# Patient Record
Sex: Male | Born: 1965 | Race: Black or African American | Hispanic: No | Marital: Single | Smoking: Current every day smoker
Health system: Southern US, Community
[De-identification: ages and names within clinical notes are randomized; demographics above are authoritative.]

## PROBLEM LIST (undated history)

## (undated) DIAGNOSIS — T7840XA Allergy, unspecified, initial encounter: Secondary | ICD-10-CM

## (undated) DIAGNOSIS — F17200 Nicotine dependence, unspecified, uncomplicated: Secondary | ICD-10-CM

## (undated) DIAGNOSIS — N400 Enlarged prostate without lower urinary tract symptoms: Secondary | ICD-10-CM

## (undated) DIAGNOSIS — N503 Cyst of epididymis: Secondary | ICD-10-CM

## (undated) DIAGNOSIS — I10 Essential (primary) hypertension: Secondary | ICD-10-CM

## (undated) DIAGNOSIS — M1990 Unspecified osteoarthritis, unspecified site: Secondary | ICD-10-CM

## (undated) HISTORY — DX: Allergy, unspecified, initial encounter: T78.40XA

## (undated) HISTORY — PX: ARTERY REPAIR: SHX559

## (undated) HISTORY — DX: Nicotine dependence, unspecified, uncomplicated: F17.200

## (undated) HISTORY — DX: Unspecified osteoarthritis, unspecified site: M19.90

## (undated) HISTORY — PX: ARM WOUND REPAIR / CLOSURE: SUR1141

## (undated) HISTORY — DX: Cyst of epididymis: N50.3

## (undated) HISTORY — DX: Benign prostatic hyperplasia without lower urinary tract symptoms: N40.0

## (undated) HISTORY — PX: PROSTATE BIOPSY: SHX241

---

## 1998-05-19 ENCOUNTER — Emergency Department (HOSPITAL_COMMUNITY): Admission: EM | Admit: 1998-05-19 | Discharge: 1998-05-19 | Payer: Self-pay | Admitting: Emergency Medicine

## 1999-05-10 ENCOUNTER — Emergency Department (HOSPITAL_COMMUNITY): Admission: EM | Admit: 1999-05-10 | Discharge: 1999-05-10 | Payer: Self-pay | Admitting: Emergency Medicine

## 1999-09-21 ENCOUNTER — Encounter: Payer: Self-pay | Admitting: Emergency Medicine

## 1999-09-21 ENCOUNTER — Emergency Department (HOSPITAL_COMMUNITY): Admission: EM | Admit: 1999-09-21 | Discharge: 1999-09-21 | Payer: Self-pay | Admitting: Emergency Medicine

## 2001-04-27 ENCOUNTER — Emergency Department (HOSPITAL_COMMUNITY): Admission: EM | Admit: 2001-04-27 | Discharge: 2001-04-27 | Payer: Self-pay | Admitting: Emergency Medicine

## 2003-02-17 ENCOUNTER — Emergency Department (HOSPITAL_COMMUNITY): Admission: EM | Admit: 2003-02-17 | Discharge: 2003-02-17 | Payer: Self-pay | Admitting: Emergency Medicine

## 2003-02-17 ENCOUNTER — Encounter: Payer: Self-pay | Admitting: Emergency Medicine

## 2005-12-24 ENCOUNTER — Emergency Department (HOSPITAL_COMMUNITY): Admission: EM | Admit: 2005-12-24 | Discharge: 2005-12-24 | Payer: Self-pay | Admitting: Emergency Medicine
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
## 2007-11-19 ENCOUNTER — Emergency Department (HOSPITAL_COMMUNITY): Admission: EM | Admit: 2007-11-19 | Discharge: 2007-11-19 | Payer: Self-pay | Admitting: Emergency Medicine

## 2007-11-19 IMAGING — CR DG CERVICAL SPINE COMPLETE 4+V
6 series · 6 of 6 positions shown · non-contrast
Comparison: None

CLINICAL DATA: CHRONIC NECK PAIN.  LEFT ARM PAIN

CERVICAL SPINE - COMPLETE 4+ VIEW

[w c-spine lat]
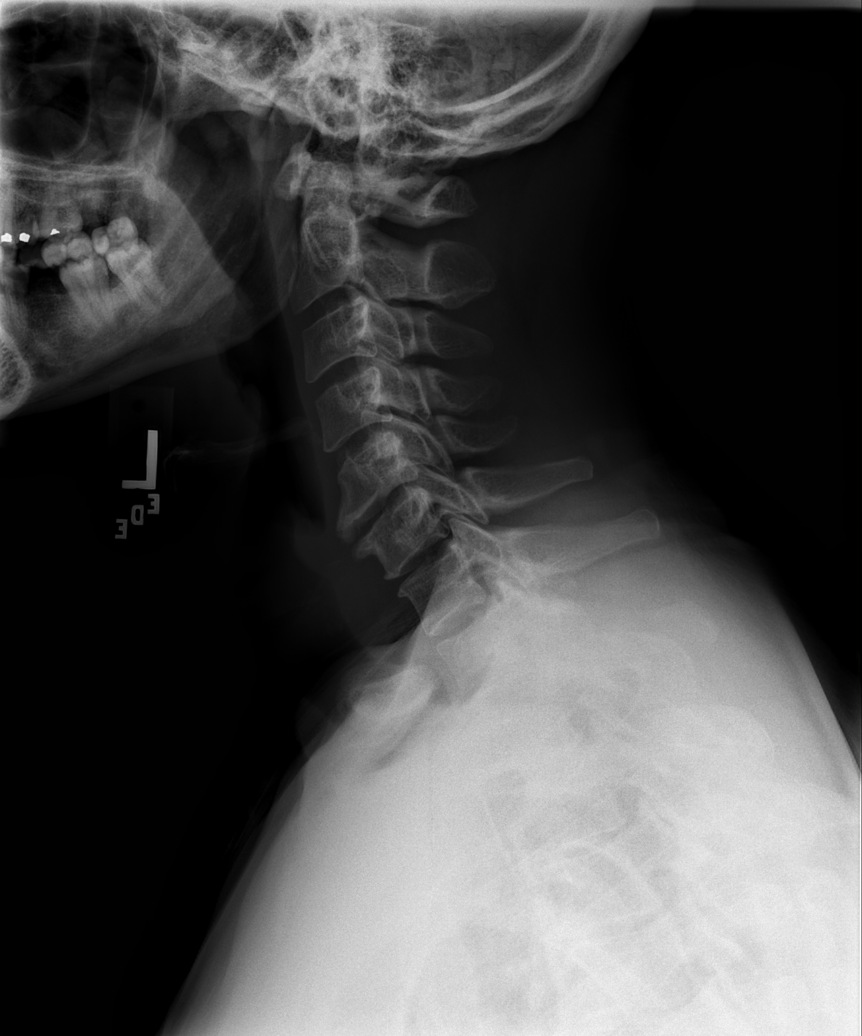

[w c-spine oblique (1 of 2)]
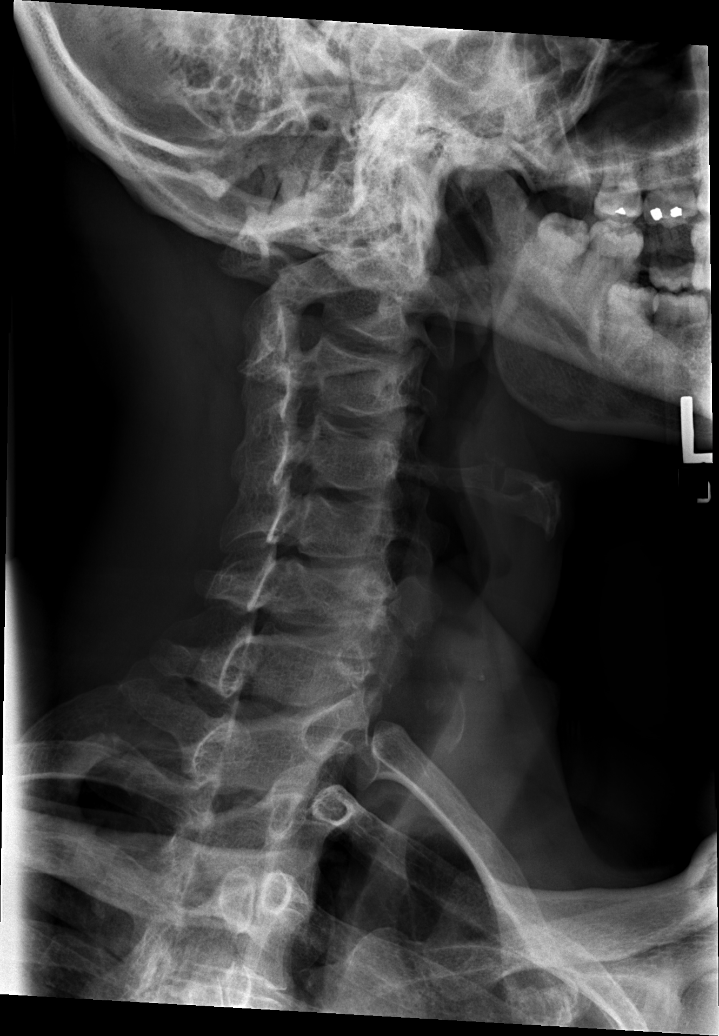

[w c-spine oblique (2 of 2)]
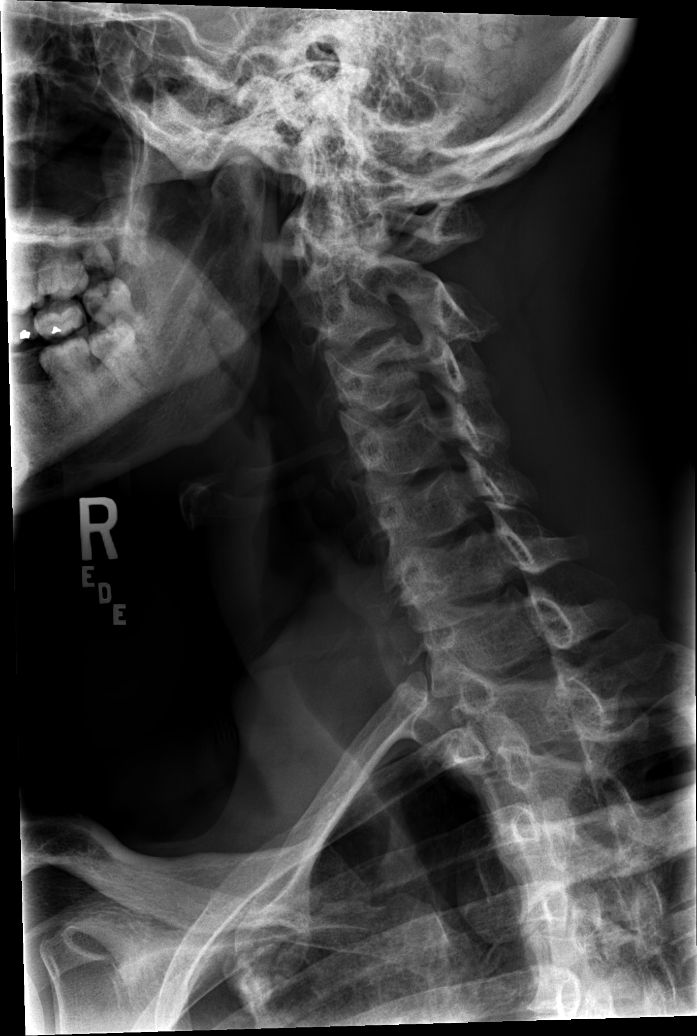

[w c-spine a.p.]
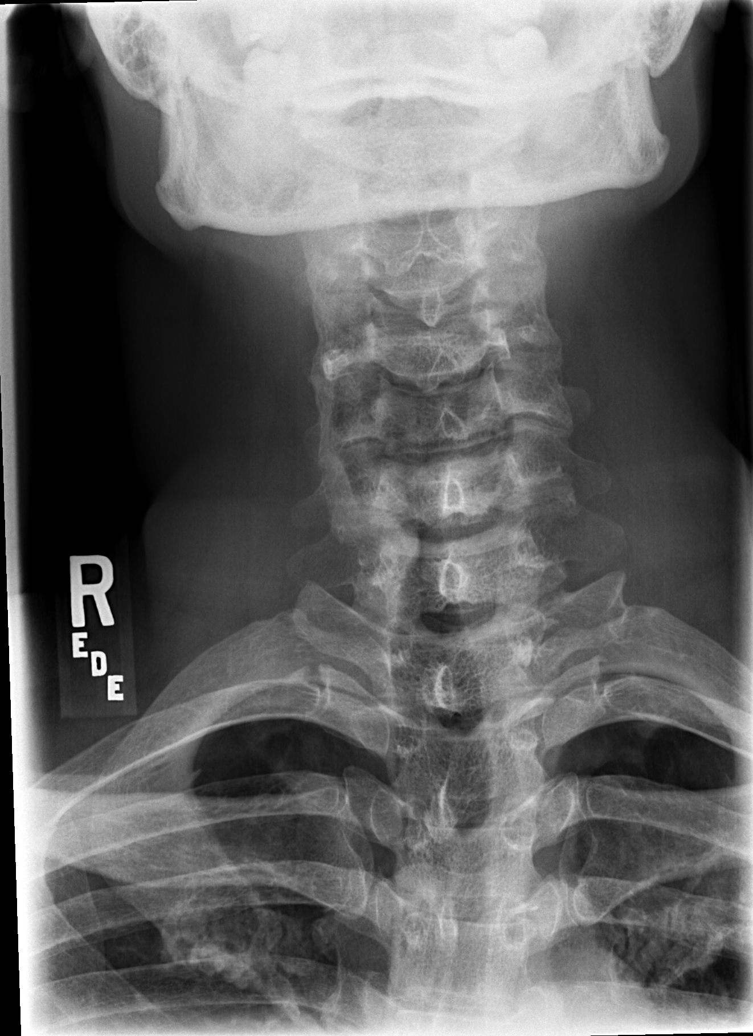

[w c-spine odontoid (1 of 2)]
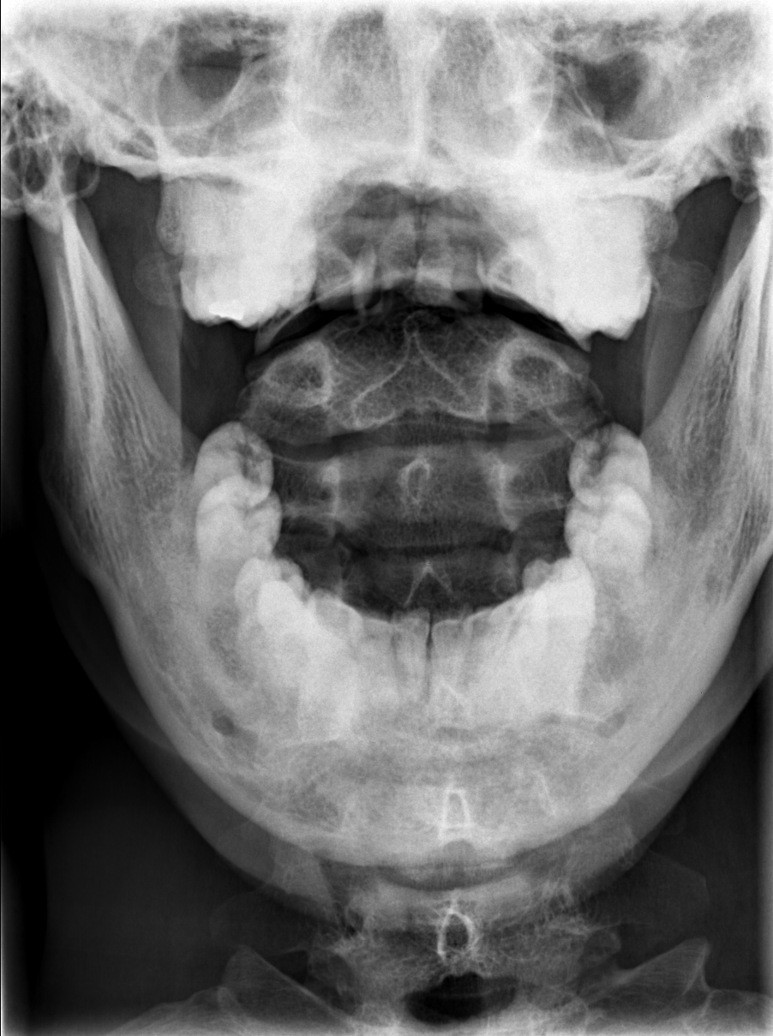

[w c-spine odontoid (2 of 2)]
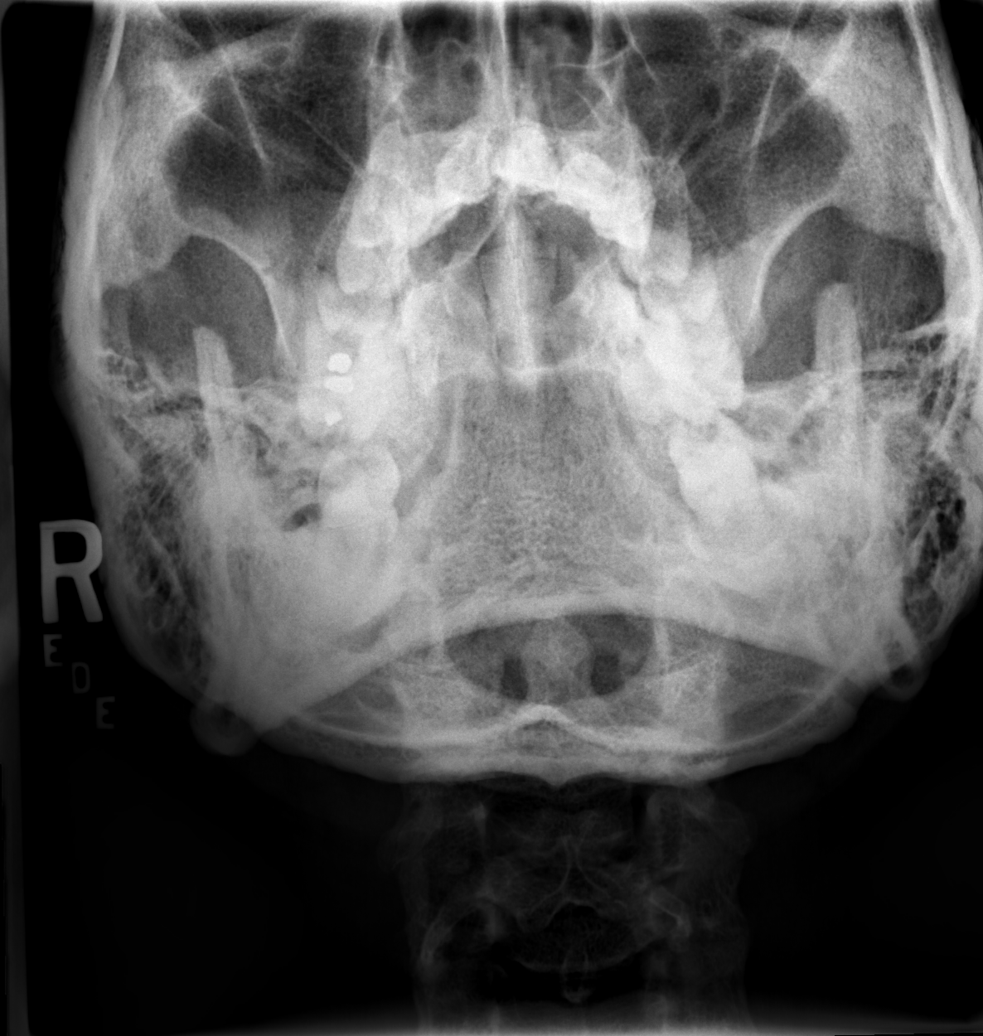

[6 of 6 positions shown; findings below may reference images not displayed]

FINDINGS: Prevertebral soft tissues appear normal.  There is
narrowing of the intervertebral disc spaces at C5-C6 and C6-C7
levels.  There is associated marginal osteophyte formation
representing degenerative spondylosis.  No fracture is evident.
Alignment is normal.  Foramina appear patent.
IMPRESSION: Evidence of degenerative disc disease and degenerative spondylosis.
No fracture or acute process seen.

## 2008-04-28 ENCOUNTER — Ambulatory Visit: Payer: Self-pay | Admitting: Nurse Practitioner

## 2008-04-28 DIAGNOSIS — F17200 Nicotine dependence, unspecified, uncomplicated: Secondary | ICD-10-CM

## 2008-04-28 DIAGNOSIS — K029 Dental caries, unspecified: Secondary | ICD-10-CM | POA: Insufficient documentation

## 2008-05-04 ENCOUNTER — Encounter (INDEPENDENT_AMBULATORY_CARE_PROVIDER_SITE_OTHER): Payer: Self-pay | Admitting: Nurse Practitioner

## 2008-06-23 ENCOUNTER — Ambulatory Visit: Payer: Self-pay | Admitting: Nurse Practitioner

## 2008-06-23 LAB — CONVERTED CEMR LAB
ALT: 11 units/L (ref 0–53)
AST: 18 units/L (ref 0–37)
Alkaline Phosphatase: 51 units/L (ref 39–117)
Basophils Absolute: 0 10*3/uL (ref 0.0–0.1)
Basophils Relative: 1 % (ref 0–1)
Bilirubin Urine: NEGATIVE
Cholesterol: 161 mg/dL (ref 0–200)
Creatinine, Ser: 1.08 mg/dL (ref 0.40–1.50)
Eosinophils Absolute: 0.2 10*3/uL (ref 0.0–0.7)
Eosinophils Relative: 4 % (ref 0–5)
Glucose, Urine, Semiquant: NEGATIVE
HCT: 41.7 % (ref 39.0–52.0)
Hemoglobin: 13 g/dL (ref 13.0–17.0)
LDL Cholesterol: 97 mg/dL (ref 0–99)
Lymphocytes Relative: 40 % (ref 12–46)
MCHC: 31.2 g/dL (ref 30.0–36.0)
Monocytes Absolute: 0.6 10*3/uL (ref 0.1–1.0)
PSA: 2.02 ng/mL (ref 0.10–4.00)
Platelets: 180 10*3/uL (ref 150–400)
RDW: 15.3 % (ref 11.5–15.5)
Sodium: 141 meq/L (ref 135–145)
Specific Gravity, Urine: >=1.03
TSH: 1.537 microintl units/mL (ref 0.350–4.50)
Total Bilirubin: 0.5 mg/dL (ref 0.3–1.2)
Total CHOL/HDL Ratio: 3.9
Total Protein: 7.2 g/dL (ref 6.0–8.3)
Urobilinogen, UA: 0.2
VLDL: 23 mg/dL (ref 0–40)

## 2008-06-27 ENCOUNTER — Encounter (INDEPENDENT_AMBULATORY_CARE_PROVIDER_SITE_OTHER): Payer: Self-pay | Admitting: Nurse Practitioner

## 2008-07-07 ENCOUNTER — Encounter (INDEPENDENT_AMBULATORY_CARE_PROVIDER_SITE_OTHER): Payer: Self-pay | Admitting: Nurse Practitioner

## 2008-07-07 ENCOUNTER — Ambulatory Visit (HOSPITAL_COMMUNITY): Admission: RE | Admit: 2008-07-07 | Discharge: 2008-07-07 | Payer: Self-pay | Admitting: Internal Medicine

## 2008-07-07 DIAGNOSIS — N503 Cyst of epididymis: Secondary | ICD-10-CM

## 2008-07-07 IMAGING — US US SCROTUM
1 series · 14 of 25 positions shown · non-contrast
Comparison: No priors

CLINICAL DATA: Bilateral scrotal masses

ULTRASOUND OF SCROTUM
TECHNIQUE: Complete ultrasound examination of the testicles,
epididymis, and other scrotal structures was performed.

[Series 1: unknown · 0.09mm/px · 14 of 54 slices shown]
[im 1/54]
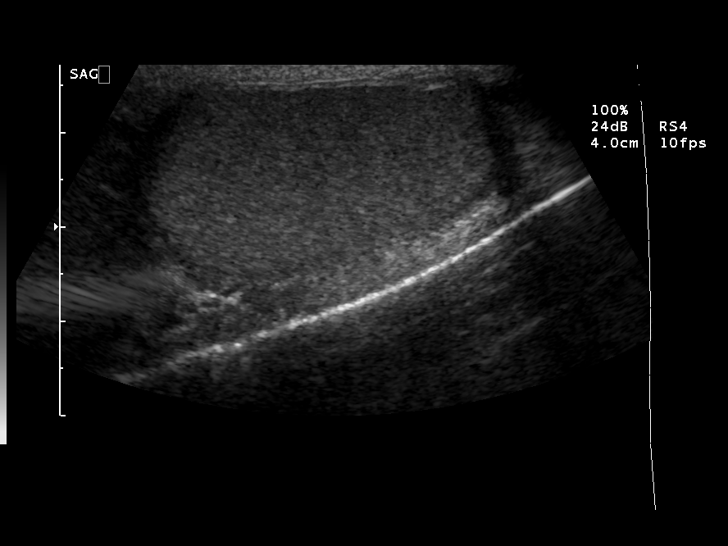
[im 5/54]
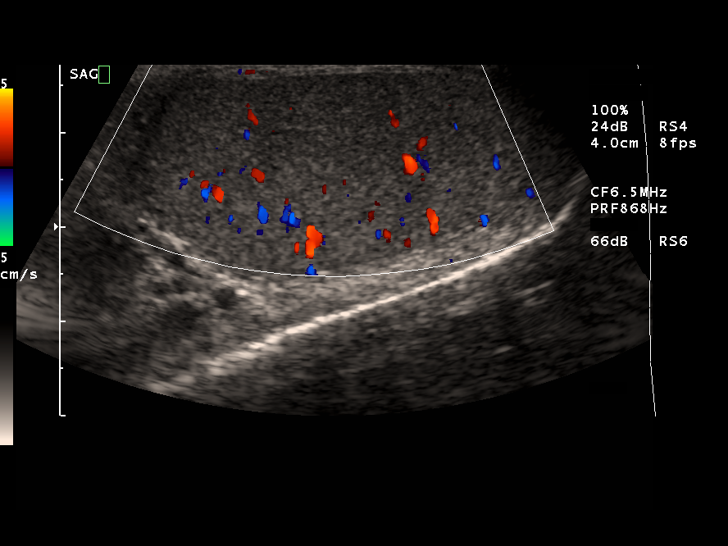
[im 9/54]
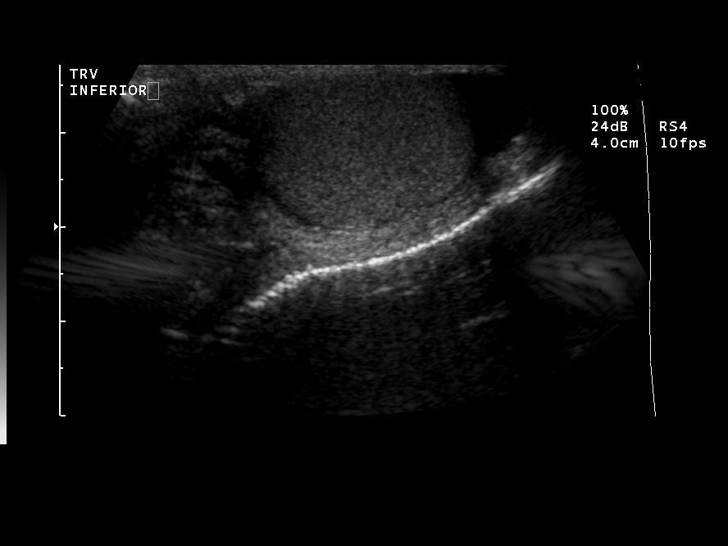
[im 14/54]
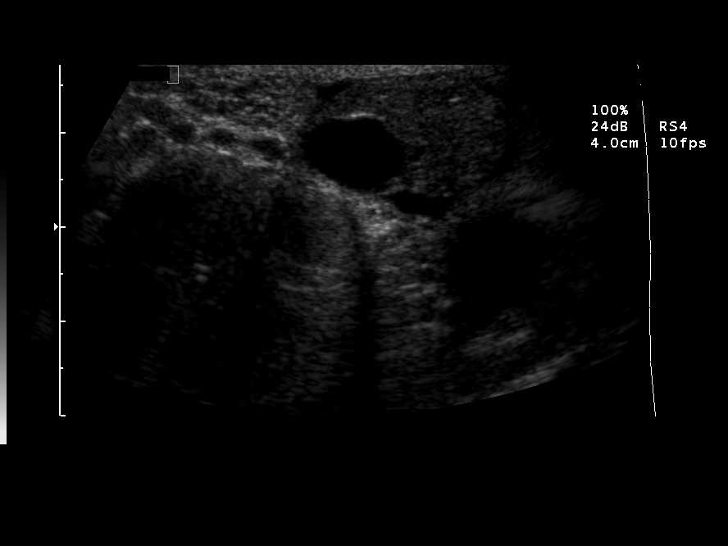
[im 18/54]
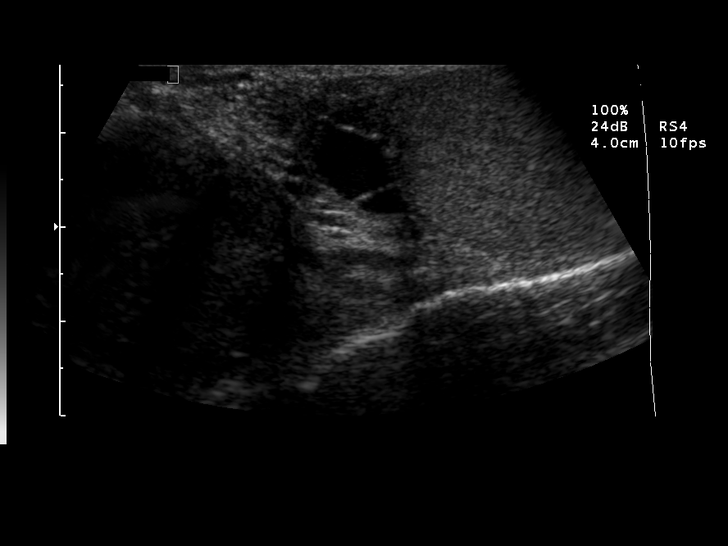
[im 20/54]
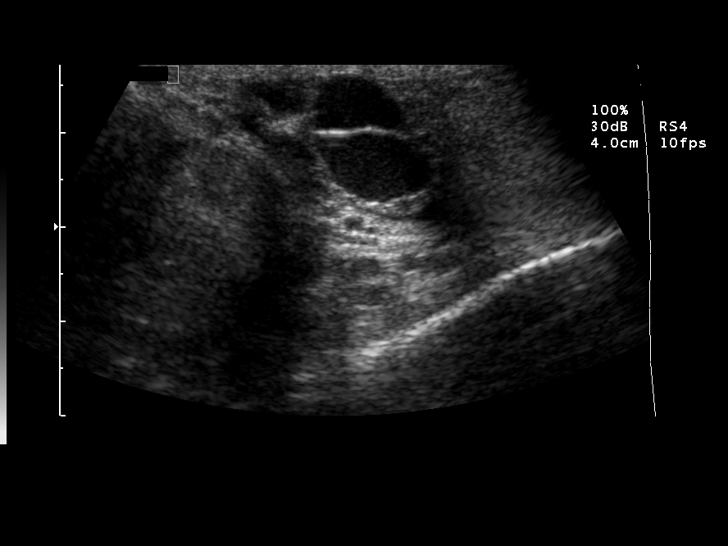
[im 25/54]
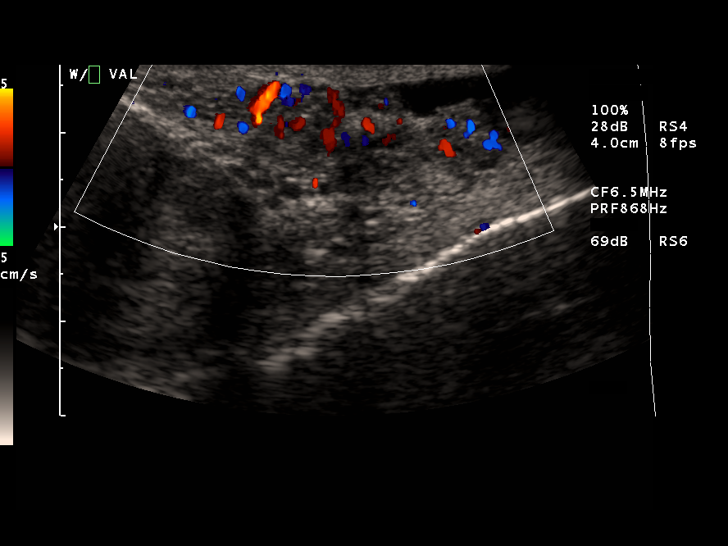
[im 29/54]
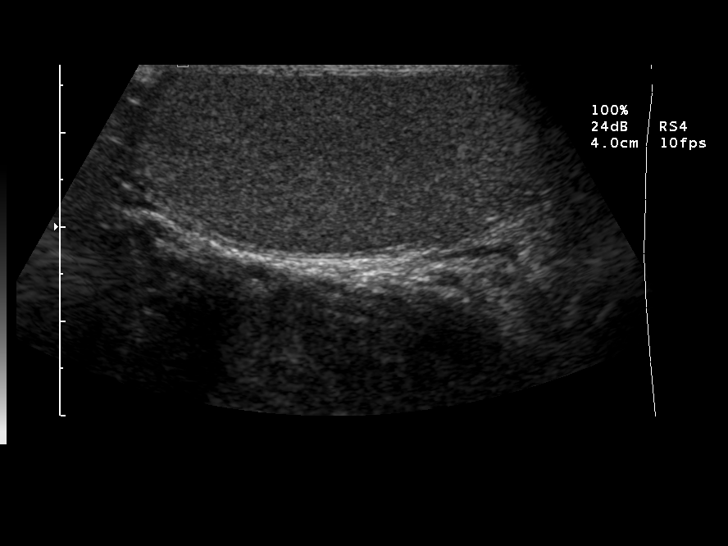
[im 34/54]
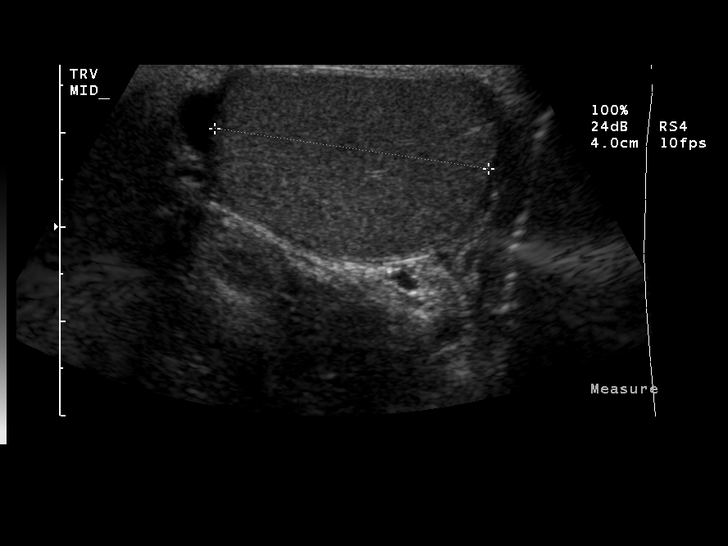
[im 36/54]
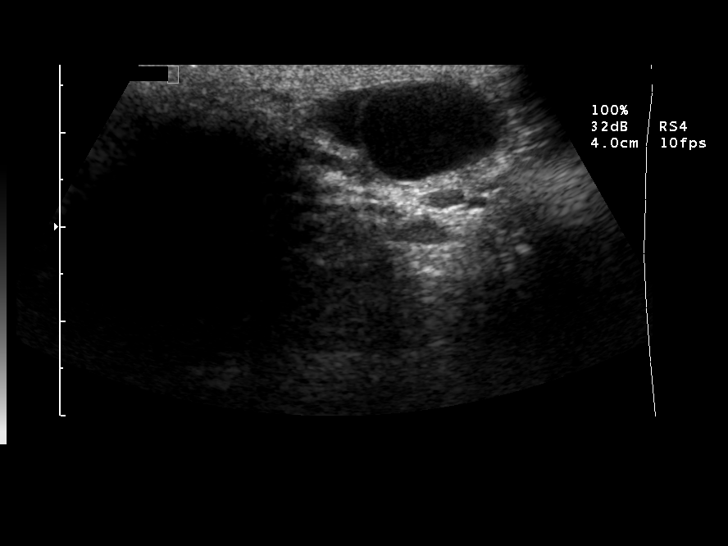
[im 40/54]
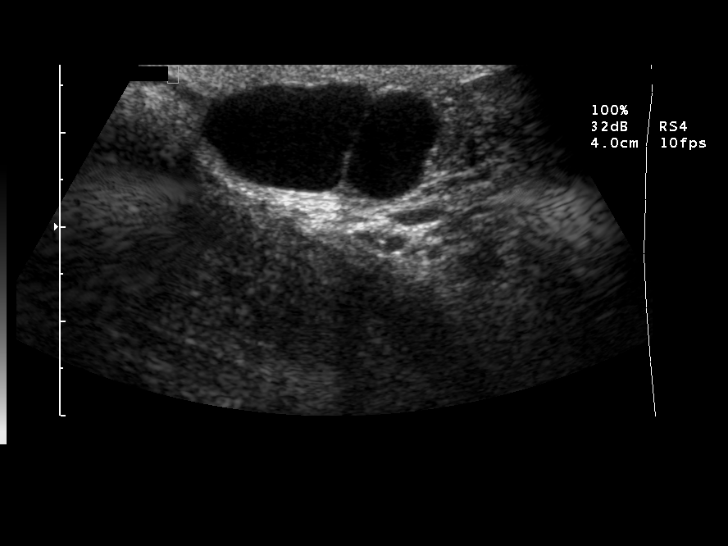
[im 45/54]
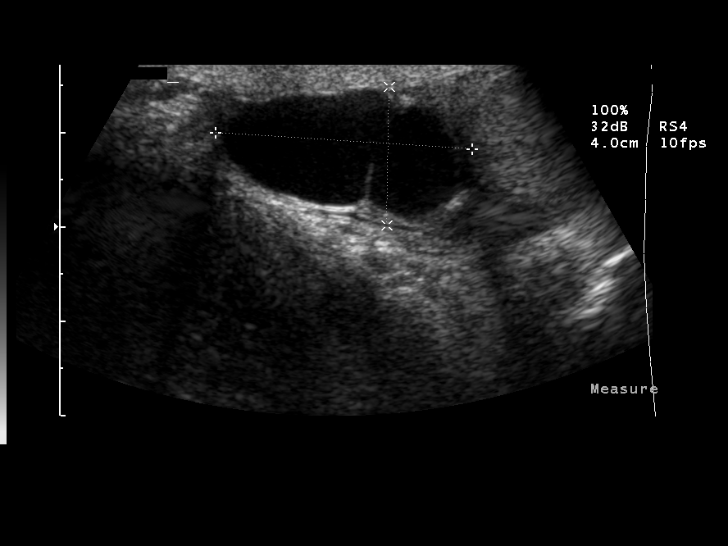
[im 49/54]
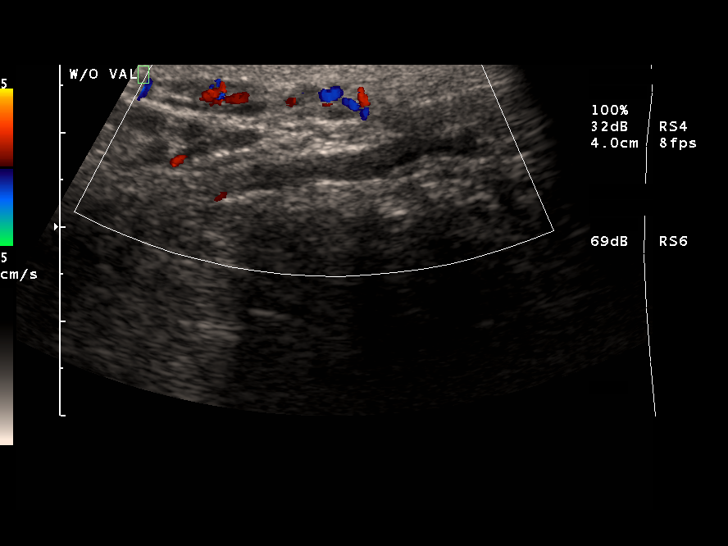
[im 54/54]
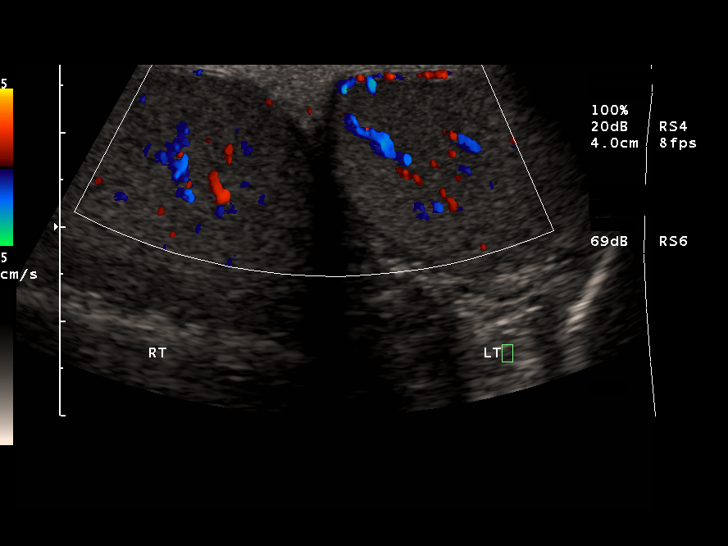

[14 of 25 positions shown; findings below may reference images not displayed]

FINDINGS: The testicles are normal bilaterally, including
symmetrical color flow Doppler.

There are bilateral, multiple, epididymal cysts/, as large as
cm in size.  The patient states that these are the abnormalities of
concern on physical exam.

No hydrocele or varicocele.
IMPRESSION: Large bilateral epididymal cysts.  See report

## 2012-10-21 ENCOUNTER — Ambulatory Visit: Payer: Self-pay | Admitting: Medical

## 2012-11-04 ENCOUNTER — Ambulatory Visit (INDEPENDENT_AMBULATORY_CARE_PROVIDER_SITE_OTHER): Payer: BC Managed Care – PPO | Admitting: Medical

## 2012-11-04 ENCOUNTER — Encounter: Payer: Self-pay | Admitting: Medical

## 2012-11-04 VITALS — BP 110/80 | HR 60 | Temp 97.7°F | Resp 16 | Ht 69.0 in | Wt 172.0 lb

## 2012-11-04 DIAGNOSIS — Z0000 Encounter for general adult medical examination without abnormal findings: Secondary | ICD-10-CM

## 2012-11-04 DIAGNOSIS — N508 Other specified disorders of male genital organs: Secondary | ICD-10-CM

## 2012-11-04 LAB — POCT URINALYSIS DIPSTICK
Bilirubin, UA: NEGATIVE
Ketones, UA: NEGATIVE
Leukocytes, UA: NEGATIVE
Spec Grav, UA: 1.01
pH, UA: 5

## 2012-11-04 LAB — COMPREHENSIVE METABOLIC PANEL
ALT: 14 U/L (ref 0–53)
CO2: 28 mEq/L (ref 19–32)
Calcium: 9.4 mg/dL (ref 8.4–10.5)
Chloride: 102 mEq/L (ref 96–112)
Creat: 1.05 mg/dL (ref 0.50–1.35)
Glucose, Bld: 88 mg/dL (ref 70–99)
Sodium: 138 mEq/L (ref 135–145)
Total Protein: 6.9 g/dL (ref 6.0–8.3)

## 2012-11-04 LAB — LIPID PANEL
Cholesterol: 151 mg/dL (ref 0–200)
HDL: 43 mg/dL (ref >39)
Triglycerides: 85 mg/dL (ref <150)

## 2012-11-04 LAB — CBC WITH DIFFERENTIAL/PLATELET
Basophils Absolute: 0 10*3/uL (ref 0.0–0.1)
Lymphocytes Relative: 41 % (ref 12–46)
Neutro Abs: 3.5 10*3/uL (ref 1.7–7.7)
Platelets: 193 10*3/uL (ref 150–400)
RBC: 4.93 MIL/uL (ref 4.22–5.81)
RDW: 14.1 % (ref 11.5–15.5)
WBC: 7.4 10*3/uL (ref 4.0–10.5)

## 2012-11-04 NOTE — Patient Instructions (Signed)
See eye doctor soon for screening.  See dentist soon for routine care.  We will refer you to Urology for enlarged prostate and multiple epididymal cysts.  Consider stopping tobacco.  We can recheck on the lump in your left buttock in a month or 2.   Preventative Care for Adults, Male       REGULAR HEALTH EXAMS:  A routine yearly physical is a good way to check in with your primary care provider about your health and preventive screening. It is also an opportunity to share updates about your health and any concerns you have, and receive a thorough all-over exam.   Most health insurance companies pay for at least some preventative services.  Check with your health plan for specific coverages.  WHAT PREVENTATIVE SERVICES DO MEN NEED?  Adult men should have their weight and blood pressure checked regularly.   Men age 29 and older should have their cholesterol levels checked regularly.  Beginning at age 25 and continuing to age 20, men should be screened for colorectal cancer.  Certain people should may need continued testing until age 19.  Other cancer screening may include exams for testicular and prostate cancer.  Updating vaccinations is part of preventative care.  Vaccinations help protect against diseases such as the flu.  Lab tests are generally done as part of preventative care to screen for anemia and blood disorders, to screen for problems with the kidneys and liver, to screen for bladder problems, to check blood sugar, and to check your cholesterol level.  Preventative services generally include counseling about diet, exercise, avoiding tobacco, drugs, excessive alcohol consumption, and sexually transmitted infections.    GENERAL RECOMMENDATIONS FOR GOOD HEALTH:  Healthy diet:  Eat a variety of foods, including fruit, vegetables, animal or vegetable protein, such as meat, fish, chicken, and eggs, or beans, lentils, tofu, and grains, such as rice.  Drink plenty of water  daily.  Decrease saturated fat in the diet, avoid lots of red meat, processed foods, sweets, fast foods, and fried foods.  Exercise:  Aerobic exercise helps maintain good heart health. At least 30-40 minutes of moderate-intensity exercise is recommended. For example, a brisk walk that increases your heart rate and breathing. This should be done on most days of the week.   Find a type of exercise or a variety of exercises that you enjoy so that it becomes a part of your daily life.  Examples are running, walking, swimming, water aerobics, and biking.  For motivation and support, explore group exercise such as aerobic class, spin class, Zumba, Yoga,or  martial arts, etc.    Set exercise goals for yourself, such as a certain weight goal, walk or run in a race such as a 5k walk/run.  Speak to your primary care provider about exercise goals.  Disease prevention:  If you smoke or chew tobacco, find out from your caregiver how to quit. It can literally save your life, no matter how long you have been a tobacco user. If you do not use tobacco, never begin.   Maintain a healthy diet and normal weight. Increased weight leads to problems with blood pressure and diabetes.   The Body Mass Index or BMI is a way of measuring how much of your body is fat. Having a BMI above 27 increases the risk of heart disease, diabetes, hypertension, stroke and other problems related to obesity. Your caregiver can help determine your BMI and based on it develop an exercise and dietary program to help  you achieve or maintain this important measurement at a healthful level.  High blood pressure causes heart and blood vessel problems.  Persistent high blood pressure should be treated with medicine if weight loss and exercise do not work.   Fat and cholesterol leaves deposits in your arteries that can block them. This causes heart disease and vessel disease elsewhere in your body.  If your cholesterol is found to be high, or if  you have heart disease or certain other medical conditions, then you may need to have your cholesterol monitored frequently and be treated with medication.   Ask if you should have a stress test if your history suggests this. A stress test is a test done on a treadmill that looks for heart disease. This test can find disease prior to there being a problem.  Avoid drinking alcohol in excess (more than two drinks per day).  Avoid use of street drugs. Do not share needles with anyone. Ask for professional help if you need assistance or instructions on stopping the use of alcohol, cigarettes, and/or drugs.  Brush your teeth twice a day with fluoride toothpaste, and floss once a day. Good oral hygiene prevents tooth decay and gum disease. The problems can be painful, unattractive, and can cause other health problems. Visit your dentist for a routine oral and dental check up and preventive care every 6-12 months.   Look at your skin regularly.  Use a mirror to look at your back. Notify your caregivers of changes in moles, especially if there are changes in shapes, colors, a size larger than a pencil eraser, an irregular border, or development of new moles.  Safety:  Use seatbelts 100% of the time, whether driving or as a passenger.  Use safety devices such as hearing protection if you work in environments with loud noise or significant background noise.  Use safety glasses when doing any work that could send debris in to the eyes.  Use a helmet if you ride a bike or motorcycle.  Use appropriate safety gear for contact sports.  Talk to your caregiver about gun safety.  Use sunscreen with a SPF (or skin protection factor) of 15 or greater.  Lighter skinned people are at a greater risk of skin cancer. Don't forget to also wear sunglasses in order to protect your eyes from too much damaging sunlight. Damaging sunlight can accelerate cataract formation.   Practice safe sex. Use condoms. Condoms are used for  birth control and to help reduce the spread of sexually transmitted infections (or STIs).  Some of the STIs are gonorrhea (the clap), chlamydia, syphilis, trichomonas, herpes, HPV (human papilloma virus) and HIV (human immunodeficiency virus) which causes AIDS. The herpes, HIV and HPV are viral illnesses that have no cure. These can result in disability, cancer and death.   Keep carbon monoxide and smoke detectors in your home functioning at all times. Change the batteries every 6 months or use a model that plugs into the wall.   Vaccinations:  Stay up to date with your tetanus shots and other required immunizations. You should have a booster for tetanus every 10 years. Be sure to get your flu shot every year, since 5%-20% of the [place] population comes down with the flu. The flu vaccine changes each year, so being vaccinated once is not enough. Get your shot in the fall, before the flu season peaks.   Other vaccines to consider:  Pneumococcal vaccine to protect against certain types of pneumonia.  This is  normally recommended for adults age 36 or older.  However, adults younger than 48 years old with certain underlying conditions such as diabetes, heart or lung disease should also receive the vaccine.  Shingles vaccine to protect against Varicella Zoster if you are older than age 34, or younger than 48 years old with certain underlying illness.  Hepatitis A vaccine to protect against a form of infection of the liver by a virus acquired from food.  Hepatitis B vaccine to protect against a form of infection of the liver by a virus acquired from blood or body fluids, particularly if you work in health care.  If you plan to travel internationally, check with your local health department for specific vaccination recommendations.  Cancer Screening:  Most routine colon cancer screening begins at the age of 87. On a yearly basis, doctors may provide special easy to use take-home tests to check for hidden  blood in the stool. Sigmoidoscopy or colonoscopy can detect the earliest forms of colon cancer and is life saving. These tests use a small camera at the end of a tube to directly examine the colon. Speak to your caregiver about this at age 78, when routine screening begins (and is repeated every 5 years unless early forms of pre-cancerous polyps or small growths are found).   At the age of 37 men usually start screening for prostate cancer every year. Screening may begin at a younger age for those with higher risk. Those at higher risk include African-Americans or having a family history of prostate cancer. There are two types of tests for prostate cancer:   Prostate-specific antigen (PSA) testing. Recent studies raise questions about prostate cancer using PSA and you should discuss this with your caregiver.   Digital rectal exam (in which your doctor's lubricated and gloved finger feels for enlargement of the prostate through the anus).   Screening for testicular cancer.  Do a monthly exam of your testicles. Gently roll each testicle between your thumb and fingers, feeling for any abnormal lumps. The best time to do this is after a hot shower or bath when the tissues are looser. Notify your caregivers of any lumps, tenderness or changes in size or shape immediately.
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
## 2012-11-10 ENCOUNTER — Telehealth: Payer: Self-pay | Admitting: Family Medicine

## 2012-11-10 NOTE — Telephone Encounter (Signed)
Patient is aware of his appointment at [facility] on 11/26/12 @ 100 am with Dr. Vernie Ammons. CLS  [phone]

## 2012-11-24 ENCOUNTER — Ambulatory Visit: Payer: Self-pay | Admitting: Family Medicine

## 2013-01-15 ENCOUNTER — Emergency Department (HOSPITAL_COMMUNITY)
Admission: EM | Admit: 2013-01-15 | Discharge: 2013-01-15 | Disposition: A | Discharge status: Home / Self Care | Payer: BC Managed Care – PPO | Attending: Emergency Medicine | Admitting: Emergency Medicine

## 2013-01-15 ENCOUNTER — Encounter (HOSPITAL_COMMUNITY): Payer: Self-pay | Admitting: *Deleted

## 2013-01-15 DIAGNOSIS — F17200 Nicotine dependence, unspecified, uncomplicated: Secondary | ICD-10-CM | POA: Insufficient documentation

## 2013-01-15 DIAGNOSIS — G5620 Lesion of ulnar nerve, unspecified upper limb: Secondary | ICD-10-CM | POA: Insufficient documentation

## 2013-01-15 DIAGNOSIS — Z8739 Personal history of other diseases of the musculoskeletal system and connective tissue: Secondary | ICD-10-CM | POA: Insufficient documentation

## 2013-01-15 DIAGNOSIS — G5621 Lesion of ulnar nerve, right upper limb: Secondary | ICD-10-CM

## 2013-01-15 MED ORDER — IBUPROFEN 800 MG PO TABS: 800.0000 mg | ORAL_TABLET | Freq: Three times a day (TID) | ORAL | Status: DC | PRN

## 2013-01-15 MED ORDER — IBUPROFEN 800 MG PO TABS
800.0000 mg | ORAL_TABLET | Freq: Once | ORAL | Status: AC
  Administered 2013-01-15: 800 mg via ORAL
  Filled 2013-01-15: qty 1

## 2013-01-15 NOTE — ED Notes (Signed)
Given patient Ice bag to put on right arm

## 2013-01-15 NOTE — ED Provider Notes (Signed)
Medical screening examination/treatment/procedure(s) were performed by non-physician practitioner and as supervising physician I was immediately available for consultation/collaboration.   Lyndsie Wallman, MD 01/15/13 0738 

## 2013-01-15 NOTE — ED Provider Notes (Signed)
History     CSN: [id number]  Arrival date & time 01/15/13  7829   First MD Initiated Contact with Patient 01/15/13 0255      Chief Complaint  Patient presents with  . Numbness   HPI  History provided by the patient. Patient is a 47 year old male with past history of right upper arm injury and surgical repair. Patient states as a teenager he came down on the pole while playing basketball causing a deep wound an injury to his right upper arm. He did have surgical repair but states following this he did not have any weakness or numbness in his upper extremity and has done well. Over the past one week or more he has been having episodes of numbness from his right pinkie in hand area sometimes traveling up his forearm and into his triceps area. 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 He denies any specific injury or trauma. Symptoms do occasionally seem worse at the end of work shift. He has not used any treatments for his symptoms. He denies any significant pains. He denies any neck pain or injury. No other aggravating or alleviating factors. No other associated symptoms.    Past Medical History  Diagnosis Date  . Tobacco use disorder     Past Surgical History  Procedure Laterality Date  . Artery repair      right upper arm - teenager, s/p trauma to right upper arm  . Arm wound repair / closure      right upper posterior arm - teenager, s/p trauma    Family History  Problem Relation Age of Onset  . Hypertension Mother   . Pneumonia Father     died of pneumonia  . Cancer Neg Hx   . Heart disease Neg Hx   . Stroke Neg Hx   . Diabetes Neg Hx     History  Substance Use Topics  . Smoking status: Current Every Day Smoker -- 0.75 packs/day for 15 years  . Smokeless tobacco: Not on file  . Alcohol Use: 3.0 oz/week    5 Shots of liquor per week      Review of Systems  HENT: Negative for neck pain.    Neurological: Positive for numbness. Negative for weakness.  All other systems reviewed and are negative.    Allergies  Review of patient's allergies indicates no known allergies.  Home Medications  No current outpatient prescriptions on file.  BP 135/118  Temp(Src) 98 F (36.7 C) (Oral)  Resp 18  SpO2 99%  Physical Exam  Nursing note and vitals reviewed. Constitutional: He is oriented to person, place, and time. He appears well-developed and well-nourished.  HENT:  Head: Normocephalic.  Neck: Normal range of motion. Neck supple.  No cervical midline tenderness. No soft tissue tenderness of the neck. No swelling or deformities.  Cardiovascular: Normal rate and regular rhythm.   Pulmonary/Chest: Effort normal and breath sounds normal.  Musculoskeletal: Normal range of motion. He exhibits no edema and no tenderness.  Old appearing scar to the posterior right upper arm.No deformity of right upper extremity. There is no pain or tenderness. He has normal range of motion of shoulder, elbow, wrist and finger joints.  normal 2 point discrimination of all digits. Normal sharp and dull sensation. Normal cap refill less than 2 seconds.  Neurological: He is alert and oriented to person, place, and time.  Skin: Skin is warm.  Psychiatric: He has  a normal mood and affect. His behavior is normal.    ED Course  Procedures       1. Ulnar nerve entrapment syndrome, right       MDM  3:20 AM patient seen and evaluated. Patient resting comfortably appears well in no acute distress. Denies any recent injury or trauma. Normal strength. Neurovascularly intact. Symptoms may be consistent with ulnar nerve entrapment (cubital tunnel syndrome),however patient may require additional workup. At this time we'll recommend conservative treatment with rest, ice, compression and elevation. Patient agrees with this plan. He will be given orthopedic hand referral.        Angus Seller,  PA-C 01/15/13 0340

## 2013-01-15 NOTE — ED Notes (Signed)
Numbness radiating down rt. Backside of arm to rt. Pinky, rt. Ring finger. Previous surgery to rt. Arm when young.

## 2014-05-28 ENCOUNTER — Emergency Department (HOSPITAL_COMMUNITY)
Admission: EM | Admit: 2014-05-28 | Discharge: 2014-05-28 | Disposition: A | Discharge status: Home / Self Care | Payer: BC Managed Care – PPO | Attending: Emergency Medicine | Admitting: Emergency Medicine

## 2014-05-28 ENCOUNTER — Encounter (HOSPITAL_COMMUNITY): Payer: Self-pay | Admitting: Emergency Medicine

## 2014-05-28 DIAGNOSIS — R21 Rash and other nonspecific skin eruption: Secondary | ICD-10-CM | POA: Diagnosis present

## 2014-05-28 DIAGNOSIS — Z720 Tobacco use: Secondary | ICD-10-CM | POA: Diagnosis not present

## 2014-05-28 DIAGNOSIS — L509 Urticaria, unspecified: Secondary | ICD-10-CM | POA: Insufficient documentation

## 2014-05-28 MED ORDER — HYDROXYZINE HCL 25 MG PO TABS: 25.0000 mg | ORAL_TABLET | Freq: Four times a day (QID) | ORAL | Status: DC | PRN

## 2014-05-28 MED ORDER — TRIAMCINOLONE ACETONIDE 0.1 % EX CREA: 1.0000 "application " | TOPICAL_CREAM | Freq: Three times a day (TID) | CUTANEOUS | Status: DC | PRN

## 2014-05-28 NOTE — ED Provider Notes (Signed)
CSN: [id number]     Arrival date & time 05/28/14  7829 History  This chart was scribed for [employer], working with Mariea Clonts, MD found by Starleen Arms, ED Scribe. This patient was seen in room TR07C/TR07C and the patient's care was started at 9:20 AM.   Chief Complaint  Patient presents with  . Rash   Patient is a 48 y.o. male presenting with rash. The history is provided by the patient. No language interpreter was used.  Rash Location:  Torso Torso rash location:  R flank Quality: dryness and itchiness   Quality: not blistering, not burning, not draining, not painful, not peeling, not red, not scaling, not swelling and not weeping   Severity:  Mild Onset quality:  Gradual Duration:  4 days Timing:  Constant Progression:  Partially resolved Chronicity:  New Context: new detergent/soap   Relieved by:  Anti-itch cream Worsened by:  Nothing tried Ineffective treatments:  None tried Associated symptoms: no abdominal pain, no fatigue, no fever, no headaches, no induration, no myalgias, no nausea, no shortness of breath, no sore throat, no throat swelling, no tongue swelling, no URI, not vomiting and not wheezing     HPI Comments: Jorge Walker is a 48 y.o. male with no PMHx who presents to the Emergency Department complaining of a constant itching rash to R flank onset 3-4 days ago.  The patient reports that rash first appeared on his right lower back and has since spread to his right abdomen as he itched the area.   Patient reports the rash becomes red after scratching it but denies redness at other times.  Patient reports he has used topical anti-itch cream with some relief.  Patient denies recent travel or sleeping in other beds aside from his own.  Patient denies changing body wash, changing detergent but does state he uses several different soaps.   Patient denies weeping/drainage, fever, sore throat, eye drainage, rhinorrhea, lip/tongue swelling, abdominal pain,  nausea, vomiting, eye watering, or other complaints.  Patient is UTD on vaccinations.  Patient denies receiving a singles shot.  NKDA.    PCP: none current.  Former: Dr. Alyson Ingles and Dr. Glade Lloyd Past Medical History  Diagnosis Date  . Tobacco use disorder    Past Surgical History  Procedure Laterality Date  . Artery repair      right upper arm - teenager, s/p trauma to right upper arm  . Arm wound repair / closure      right upper posterior arm - teenager, s/p trauma   Family History  Problem Relation Age of Onset  . Hypertension Mother   . Pneumonia Father     died of pneumonia  . Cancer Neg Hx   . Heart disease Neg Hx   . Stroke Neg Hx   . Diabetes Neg Hx    History  Substance Use Topics  . Smoking status: Current Every Day Smoker -- 0.75 packs/day for 15 years  . Smokeless tobacco: Not on file  . Alcohol Use: 3.0 oz/week    5 Shots of liquor per week    Review of Systems  Constitutional: Negative for fever and fatigue.  HENT: Negative for congestion, facial swelling, rhinorrhea, sneezing and sore throat.   Eyes: Negative for discharge.  Respiratory: Negative for shortness of breath and wheezing.   Gastrointestinal: Negative for nausea, vomiting and abdominal pain.  Musculoskeletal: Negative for myalgias.  Skin: Positive for color change and rash.  Allergic/Immunologic: Negative for environmental allergies, food allergies and  immunocompromised state.  Neurological: Negative for numbness and headaches.   10 Systems reviewed and all are negative for acute change except as noted in the HPI.    Allergies  Review of patient's allergies indicates no known allergies.  Home Medications   Prior to Admission medications   Medication Sig Start Date End Date Taking? Authorizing Provider  ibuprofen (ADVIL,MOTRIN) 800 MG tablet Take 1 tablet (800 mg total) by mouth every 8 (eight) hours as needed for pain. 01/15/13   Ruthell Rummage Dammen, PA-C   BP 121/84  Pulse 67  Temp(Src)  98.6 F (37 C) (Oral)  Resp 20  Ht 5' 8.5" (1.74 m)  Wt 173 lb (78.472 kg)  BMI 25.92 kg/m2  SpO2 99% Physical Exam  Nursing note and vitals reviewed. Constitutional: He is oriented to person, place, and time. Vital signs are normal. He appears well-developed and well-nourished.  Non-toxic appearance. No distress.  Afebrile, nontoxic  HENT:  Head: Normocephalic and atraumatic.  Mouth/Throat: Oropharynx is clear and moist and mucous membranes are normal.  Eyes: Conjunctivae and EOM are normal. Right eye exhibits no discharge. Left eye exhibits no discharge.  Neck: Normal range of motion. Neck supple.  Cardiovascular: Normal rate.   Pulmonary/Chest: Effort normal. No respiratory distress.  Abdominal: Normal appearance. He exhibits no distension.  Musculoskeletal: Normal range of motion.  Neurological: He is alert and oriented to person, place, and time.  Skin: Skin is warm, dry and intact. Rash noted. Rash is urticarial. No erythema.  Resolving urticarial rash over R flank and R lateral abdomen, area darkened without excoriations or vesicles, no erythema or warmth, no areas of induration or fluctuance  Psychiatric: He has a normal mood and affect. His behavior is normal.    ED Course  Procedures (including critical care time)  DIAGNOSTIC STUDIES: Oxygen Saturation is 99% on RA, normal by my interpretation.    COORDINATION OF CARE:  9:27 AM Will order anti-itch cream.  Will order follow-up with PCP or dermatologist.  Advised patient that he can rotate through his body soaps to try to isolate the cause of the rash.  Advised patient of return precautions.    Labs Review Labs Reviewed - No data to display  Imaging Review No results found.   EKG Interpretation None      MDM   Final diagnoses:  Rash of back    47y/o male with rash, nonspecific appearance, could be allergic component although no specific source in hx aside from maybe multiple detergents. Discussed that it  could be eczema, and discussed decreasing soap varieties, and taking cool showers and using aveeno after. Discussed that it could be contact dermatitis, and will rx triamcinolone and vistaril. Discussed it does not appear to be shingles or scabies, but return precautions for these given. Will have pt f/up with PCP and dermatology as needed for ongoing symptoms. I explained the diagnosis and have given explicit precautions to return to the ER including for any other new or worsening symptoms. The patient understands and accepts the medical plan as it's been dictated and I have answered their questions. Discharge instructions concerning home care and prescriptions have been given. The patient is STABLE and is discharged to home in good condition.  BP 121/84  Pulse 67  Temp(Src) 98.6 F (37 C) (Oral)  Resp 20  Ht 5' 8.5" (1.74 m)  Wt 173 lb (78.472 kg)  BMI 25.92 kg/m2  SpO2 99%  Meds ordered this encounter  Medications  . hydrOXYzine (ATARAX/VISTARIL) 25  MG tablet    Sig: Take 1 tablet (25 mg total) by mouth every 6 (six) hours as needed for itching.    Dispense:  28 tablet    Refill:  0    Order Specific Question:  Supervising Provider    Answer:  Noemi Chapel D [[id number]]  . triamcinolone cream (KENALOG) 0.1 %    Sig: Apply 1 application topically 3 (three) times daily as needed. For itching    Dispense:  30 g    Refill:  0    Order Specific Question:  Supervising Provider    Answer:  Noemi Chapel D [[id number]]    I personally performed the services described in this documentation, which was scribed in my presence. The recorded information has been reviewed and is accurate.   [place], [place] 05/28/14 [phone]

## 2014-05-28 NOTE — ED Provider Notes (Signed)
Medical screening examination/treatment/procedure(s) were performed by non-physician practitioner and as supervising physician I was immediately available for consultation/collaboration.   EKG Interpretation None        Mariea Clonts, MD 05/28/14 1651

## 2014-05-28 NOTE — ED Notes (Signed)
Pt c/o rash to right side/flank area x 1 week. Pt reports rash is itching. Pt has dark area noted to right flank area.

## 2014-05-28 NOTE — Discharge Instructions (Signed)
Use triamcinolone cream for itching or rash, vistaril for itching or allergy type symptoms, and continue your usual home medications. Get plenty of rest, drink plenty of fluids, shower with cool water (no hot steamy showers); decrease the number of different soaps you use, and try to see if one specific one causes it; use cetaphil cleanser or dove soap when using soap; use eucerin or aveeno lotion after every bath/shower. Please followup with your primary doctor for discussion of your diagnoses and further evaluation after today's visit; if you do not have a primary care doctor use the resource guide provided to find one; Also follow-up as needed with the dermatology office as listed in your discharge instructions. Return to the ER for any changes or worsening symptoms   Rash A rash is a change in the color or texture of your skin. There are many different types of rashes. You may have other problems that accompany your rash. CAUSES   Infections.  Allergic reactions. This can include allergies to pets or foods.  Certain medicines.  Exposure to certain chemicals, soaps, or cosmetics.  Heat.  Exposure to poisonous plants.  Tumors, both cancerous and noncancerous. SYMPTOMS   Redness.  Scaly skin.  Itchy skin.  Dry or cracked skin.  Bumps.  Blisters.  Pain. DIAGNOSIS  Your caregiver may do a physical exam to determine what type of rash you have. A skin sample (biopsy) may be taken and examined under a microscope. TREATMENT  Treatment depends on the type of rash you have. Your caregiver may prescribe certain medicines. For serious conditions, you may need to see a skin doctor (dermatologist). HOME CARE INSTRUCTIONS   Avoid the substance that caused your rash.  Do not scratch your rash. This can cause infection.  You may take cool baths to help stop itching.  Only take over-the-counter or prescription medicines as directed by your caregiver.  Keep all follow-up appointments  as directed by your caregiver. SEEK IMMEDIATE MEDICAL CARE IF:  You have increasing pain, swelling, or redness.  You have a fever.  You have new or severe symptoms.  You have body aches, diarrhea, or vomiting.  Your rash is not better after 3 days. MAKE SURE YOU:  Understand these instructions.  Will watch your condition.  Will get help right away if you are not doing well or get worse. Document Released: 08/02/2002 Document Revised: 11/04/2011 Document Reviewed: 05/27/2011 [facility] Patient Information 2015 [place], [place]. This information is not intended to replace advice given to you by your health care provider. Make sure you discuss any questions you have with your health care provider. Emergency Department Resource Guide 1) Find a Doctor and Pay Out of Pocket Although you won't have to find out who is covered by your insurance plan, it is a good idea to ask around and get recommendations. You will then need to call the office and see if the doctor you have chosen will accept you as a new patient and what types of options they offer for patients who are self-pay. Some doctors offer discounts or will set up payment plans for their patients who do not have insurance, but you will need to ask so you aren't surprised when you get to your appointment.  2) Contact Your Local Health Department Not all health departments have doctors that can see patients for sick visits, but many do, so it is worth a call to see if yours does. If you don't know where your local health department is, you can  check in your phone book. The CDC also has a tool to help you locate your state's health department, and many state websites also have listings of all of their local health departments.  3) Find a [facility] If your illness is not likely to be very severe or complicated, you may want to try a walk in clinic. These are popping up all over the country in pharmacies, drugstores, and shopping centers.  They're usually staffed by nurse practitioners or physician assistants that have been trained to treat common illnesses and complaints. They're usually fairly quick and inexpensive. However, if you have serious medical issues or chronic medical problems, these are probably not your best option.  No Primary Care Doctor: - Call Health Connect at  [phone] - they can help you locate a primary care doctor that  accepts your insurance, provides certain services, etc. - Physician Referral Service- [phone]  Chronic Pain Problems: Organization         Address  Phone   Notes  [facility]  [phone] Patients need to be referred by their primary care doctor.   Medication Assistance: Organization         Address  Phone   Notes  [facility] Medication [facility] [address] [place], [place] [address] [phone] --Must be a resident of [facility] -- Must have NO insurance coverage whatsoever (no Medicaid/ Medicare, etc.) -- The pt. MUST have a primary care doctor that directs their care regularly and follows them in the community   [facility]  [phone]   [employer]  [phone]    Agencies that provide inexpensive medical care: Organization         Address  Phone   Notes  [place]  [phone]   [facility]    [phone]   [facility] [place], [place] [address] [phone]   [place] [address] [place] [phone]   [facility]    [phone]   [facility]    [phone]   [place] [address], [place] Phone:  [phone], Fax:  [phone] Hours of Operation:  9 am - 6 pm, M-F.  Also accepts Medicaid/Medicare and self-pay.  [facility] for [address], [address], [place] Phone: [phone], Fax: [phone]. Hours of Operation:  8:30 am - 5:30 pm, M-F.  Also accepts Medicaid and self-pay.  [facility] [address], [place] Phone: [phone]   [place], [place], [place] [phone], Ext. [phone] Mondays & Thursdays: 7-9 AM.  First 15 patients are seen on a first come, first serve basis.    [place] Providers:  Organization         Address  Phone   Notes  [facility] [address], [place] [phone] Also accepts self-pay patients.  [facility] [address], [place]  [phone]   [place], [address], [place] [phone]   [facility] [address] [place] [phone]   Lucianne Lei [address], [place]   [phone] Only accepts [place] patients after they have their name applied to  their card.   Self-Pay (no insurance) in [facility]:  Organization         Address  Phone   Notes  Sickle Cell Patients, [facility] [place] [phone]   [facility] [place] [phone]   [facility] [place]  [place], [place], [place] [phone]   [facility]/Dr. Osei-Bonsu  [address], [place] or [address], [place] [phone] Phone number for both [place] and [place] locations is the same.  [facility]gent [facility] and [facility] [address] [place] [phone]   [facility] [address], [place] or [address] [phone]   [facility] [address] [place] [phone], phone; [phone], fax Sees patients 1st and 3rd Saturday of every month.  Must not qualify for public or private insurance (i.e.  Medicaid, Medicare, [place] Health Choice, Veterans' Benefits)  Household income should be no more than 200% of the poverty level The clinic cannot treat you if you are pregnant or think you are pregnant  Sexually transmitted diseases are not treated at the clinic.

## 2014-08-17 ENCOUNTER — Telehealth: Payer: Self-pay | Admitting: Medical

## 2014-08-17 ENCOUNTER — Encounter: Payer: Self-pay | Admitting: Medical

## 2014-08-17 ENCOUNTER — Ambulatory Visit (INDEPENDENT_AMBULATORY_CARE_PROVIDER_SITE_OTHER): Payer: BC Managed Care – PPO | Admitting: Medical

## 2014-08-17 VITALS — BP 110/80 | HR 68 | Temp 98.1°F | Resp 16 | Ht 68.5 in | Wt 130.0 lb

## 2014-08-17 DIAGNOSIS — Z23 Encounter for immunization: Secondary | ICD-10-CM

## 2014-08-17 DIAGNOSIS — F17200 Nicotine dependence, unspecified, uncomplicated: Secondary | ICD-10-CM

## 2014-08-17 DIAGNOSIS — N4340 Spermatocele of epididymis, unspecified: Secondary | ICD-10-CM

## 2014-08-17 DIAGNOSIS — Z720 Tobacco use: Secondary | ICD-10-CM

## 2014-08-17 DIAGNOSIS — Z125 Encounter for screening for malignant neoplasm of prostate: Secondary | ICD-10-CM

## 2014-08-17 DIAGNOSIS — N400 Enlarged prostate without lower urinary tract symptoms: Secondary | ICD-10-CM

## 2014-08-17 DIAGNOSIS — D179 Benign lipomatous neoplasm, unspecified: Secondary | ICD-10-CM

## 2014-08-17 DIAGNOSIS — Z0000 Encounter for general adult medical examination without abnormal findings: Secondary | ICD-10-CM

## 2014-08-17 LAB — LIPID PANEL
Cholesterol: 153 mg/dL (ref 0–200)
HDL: 39 mg/dL — AB (ref >39)
LDL CALC: 92 mg/dL (ref 0–99)
TRIGLYCERIDES: 110 mg/dL (ref <150)
Total CHOL/HDL Ratio: 3.9 Ratio
VLDL: 22 mg/dL (ref 0–40)

## 2014-08-17 LAB — POCT URINALYSIS DIPSTICK
Bilirubin, UA: NEGATIVE
Blood, UA: NEGATIVE
GLUCOSE UA: NEGATIVE
Ketones, UA: NEGATIVE
Leukocytes, UA: NEGATIVE
NITRITE UA: NEGATIVE
PROTEIN UA: NEGATIVE
Spec Grav, UA: 1.02
UROBILINOGEN UA: NEGATIVE
pH, UA: 6

## 2014-08-17 LAB — COMPREHENSIVE METABOLIC PANEL
ALBUMIN: 3.5 g/dL (ref 3.5–5.2)
ALK PHOS: 38 U/L — AB (ref 39–117)
ALT: 21 U/L (ref 0–53)
AST: 28 U/L (ref 0–37)
BILIRUBIN TOTAL: 0.3 mg/dL (ref 0.2–1.2)
BUN: 12 mg/dL (ref 6–23)
CO2: 28 mEq/L (ref 19–32)
Calcium: 9.1 mg/dL (ref 8.4–10.5)
Chloride: 104 mEq/L (ref 96–112)
Creat: 1.04 mg/dL (ref 0.50–1.35)
Glucose, Bld: 82 mg/dL (ref 70–99)
POTASSIUM: 4.1 meq/L (ref 3.5–5.3)
SODIUM: 139 meq/L (ref 135–145)
TOTAL PROTEIN: 5.3 g/dL — AB (ref 6.0–8.3)

## 2014-08-17 MED ORDER — VARENICLINE TARTRATE 0.5 MG X 11 & 1 MG X 42 PO MISC: ORAL | Status: DC

## 2014-08-17 MED ORDER — TAMSULOSIN HCL 0.4 MG PO CAPS: 0.4000 mg | ORAL_CAPSULE | Freq: Every day | ORAL | Status: DC

## 2014-08-17 NOTE — Progress Notes (Signed)
Subjective:   HPI  Jorge Walker is a 48 y.o. male who presents for a complete physical.   Preventative care: Last ophthalmology visit: n/a Last dental visit:yes Last colonoscopy:n/a Last prostate exam: ? Last EKG:3/ 2014 Last labs:2014  Prior vaccinations: TD or Tdap:? Influenza:not sure about this Pneumococcal:n/a Shingles/Zostavax:n/a  Concerns: Wants me to check lumps on his body from last year's physical including left upper thigh lump and buttock lumps bilat cheeks.  Wants to recheck the lumps in the scrotum, thinks the lumps may be about the same as last year.    Occasionally when using bathroom, has some changes in stream, some hesitancy.  Weaker stream than in the past.   Some dribbling at the end of the stream.   Some nocturia, not daily, sometimes more than once.  Doesn't drink much water.  Drinks a lot of soda.   Reviewed their medical, surgical, family, social, medication, and allergy history and updated chart as appropriate.  Past Medical History  Diagnosis Date  . Tobacco use disorder   . BPH (benign prostatic hypertrophy)   . Epididymal cyst     Past Surgical History  Procedure Laterality Date  . Artery repair      right upper arm - teenager, s/p trauma to right upper arm  . Arm wound repair / closure      right upper posterior arm - teenager, s/p trauma    History   Social History  . Marital Status: Single    Spouse Name: N/A    Number of Children: N/A  . Years of Education: N/A   Occupational History  . Not on file.   Social History Main Topics  . Smoking status: Current Every Day Smoker -- 0.75 packs/day for 16 years  . Smokeless tobacco: Not on file  . Alcohol Use: 3.0 oz/week    5 Shots of liquor per week  . Drug Use: No  . Sexual Activity: Not on file   Other Topics Concern  . Not on file   Social History Narrative   Single, girlfriend, divorced, has 1 child, 81yo daughter wants to go to college, she lives with mother,  exercise with walking at work, works in Scientist, research (life sciences) at [employer] History  Problem Relation Age of Onset  . Hypertension Mother   . Pneumonia Father     died of pneumonia  . Cancer Neg Hx   . Heart disease Neg Hx   . Stroke Neg Hx   . Diabetes Neg Hx     Current outpatient prescriptions: tamsulosin (FLOMAX) 0.4 MG CAPS capsule, Take 1 capsule (0.4 mg total) by mouth daily., Disp: 30 capsule, Rfl: 3;  varenicline (CHANTIX STARTING MONTH PAK) 0.5 MG X 11 & 1 MG X 42 tablet, Take one 0.5 mg tablet by mouth once daily for 3 days, then increase to one 0.5 mg tablet twice daily for 4 days, then increase to one 1 mg tablet twice daily., Disp: 53 tablet, Rfl: 0  No Known Allergies   Review of Systems Constitutional: -fever, -chills, -sweats, -unexpected weight change, -decreased appetite, -fatigue Allergy: -sneezing, -itching, -congestion Dermatology: -changing moles, --rash, +lumps ENT: -runny nose, -ear pain, -sore throat, -hoarseness, -sinus pain, -teeth pain, - ringing in ears, -hearing loss, -nosebleeds Cardiology: -chest pain, -palpitations, -swelling, -difficulty breathing when lying flat, -waking up short of breath Respiratory: -cough, -shortness of breath, -difficulty breathing with exercise or exertion, -wheezing, -coughing up blood Gastroenterology: -abdominal pain, -nausea, -vomiting, -diarrhea, -constipation, -blood in  stool, -changes in bowel movement, -difficulty swallowing or eating Hematology: -bleeding, -bruising  Musculoskeletal: +joint aches, -muscle aches, -joint swelling, -back pain, -neck pain, -cramping, -changes in gait Ophthalmology: denies vision changes, eye redness, itching, discharge Urology: -burning with urination, -difficulty urinating, -blood in urine, +urinary frequency, -urgency, -incontinence Neurology: -headache, -weakness, -tingling, -numbness, -memory loss, -falls, -dizziness Psychology: -depressed mood, -agitation, -sleep problems      Objective:   Physical Exam  BP 110/80 mmHg  Pulse 68  Temp(Src) 98.1 F (36.7 C) (Oral)  Resp 16  Ht 5' 8.5" (1.74 m)  Wt 130 lb (58.968 kg)  BMI 19.48 kg/m2   General appearance: alert, no distress, WD/WN, lean AA male Skin:right tricep region with prior trauma scar, no other worrisome lesions HEENT: normocephalic, conjunctiva/corneas normal, sclerae anicteric, PERRLA, EOMi, nares patent, no discharge or erythema, pharynx normal Oral cavity: MMM, tongue normal, teeth with upper dentures, othwerise in good repair Neck: supple, no lymphadenopathy, no thyromegaly, no masses, normal ROM, no bruits Chest: non tender, normal shape and expansion Heart: RRR, normal S1, S2, no murmurs Lungs: CTA bilaterally, no wheezes, rhonchi, or rales Abdomen: +bs, soft, non tender, non distended, no masses, no hepatomegaly, no splenomegaly, no bruits Back: non tender, normal ROM, no scoliosis Musculoskeletal: left buttock midline with 3-4cm deep round palpable mass, possible cyst vs lipoma, nontender, left upper anterior thigh with 4cm not well defined but fatty tissue lump most likely lipoma, otherwise upper extremities non tender, no obvious deformity, normal ROM throughout, lower extremities non tender, no obvious deformity, normal ROM throughout Extremities: no edema, no cyanosis, no clubbing Pulses: 2+ symmetric, upper and lower extremities, normal cap refill Neurological: alert, oriented x 3, CN2-12 intact, strength normal upper extremities and lower extremities, sensation normal throughout, DTRs 2+ throughout, no cerebellar signs, gait normal Psychiatric: normal affect, behavior normal, pleasant  GU: normal male external genitalia, large multiple nodules round and hard bilat superior to testes suggestive of epididymal cysts as noted on prior 2009 ultrasound, but seem larger, somewhat tender, left largest is >1.3 cm diameter, no hernia, no lymphadenopathy Rectal: small external hemorrhoids, prostate  moderately enlarged, no palpable nodules, occult negative stool   Assessment and Plan :   Encounter Diagnoses  Name Primary?  . Encounter for health maintenance examination in adult Yes  . Tobacco use disorder   . Spermatocele   . BPH (benign prostatic hypertrophy)   . Screening for prostate cancer   . Need for Tdap vaccination   . Need for prophylactic vaccination against Streptococcus pneumoniae (pneumococcus)   . Need for prophylactic vaccination and inoculation against influenza   . Lipoma    We discussed his concerns.   He is not having problems with the spermatoceles or lumps/lipomas, thus, no additional treatment at this time.  Reassured they are benign findings Begin Flomax for BPH symptoms Labs today Counseled on the Tdap (tetanus, diptheria, and acellular pertussis) vaccine.  Vaccine information sheet given. Tdap vaccine given after consent obtained. Counseled on the influenza virus vaccine.  Vaccine information sheet given.  Influenza vaccine given after consent obtained. Counseled on the pneumococcal vaccine.  Vaccine information sheet given.  Pneumococcal/PPSV23 vaccine given after consent obtained. He agrees to treatment for tobacco cessation.   Begin counseling, Chantix, efforts to stop. discussed other medication options, discussed risks/benefits of chantix.  F/u pending labs

## 2014-08-17 NOTE — Patient Instructions (Signed)
Encounter Diagnoses  Name Primary?  . Encounter for health maintenance examination in adult Yes  . Tobacco use disorder   . Spermatocele   . BPH (benign prostatic hypertrophy)   . Screening for prostate cancer   . Need for Tdap vaccination   . Need for prophylactic vaccination against Streptococcus pneumoniae (pneumococcus)   . Need for prophylactic vaccination and inoculation against influenza   . Lipoma    Recommendations:  Begin Chantix starter pack to help stop tobacco  Call 1 800 quit now hotline for counseling on helping quit tobacco  Plan to recheck in 1-2 months on tobacco cessation  Wait at least 1-2 weeks before starting Flomax  Begin Flomax once daily to help with urinary symptoms  We will call with lab results  We updated your tetanus diphtheria pertussis vaccine today  We updated your pneumococcal vaccine today  We did your yearly flu vaccine today  You have spermatoceles of both scrotal areas that are benign  You have a lipoma and benign findings of the left upper thigh and left buttock See your eye doctor yearly for routine vision care. See your dentist yearly for routine dental care including hygiene visits twice yearly.

## 2014-08-18 LAB — RPR

## 2014-08-18 LAB — CBC
HEMATOCRIT: 44.6 % (ref 39.0–52.0)
Hemoglobin: 14.3 g/dL (ref 13.0–17.0)
MCH: 27.6 pg (ref 26.0–34.0)
MCHC: 32.1 g/dL (ref 30.0–36.0)
MCV: 85.9 fL (ref 78.0–100.0)
MPV: 10.3 fL (ref 9.4–12.4)
Platelets: 188 10*3/uL (ref 150–400)
RBC: 5.19 MIL/uL (ref 4.22–5.81)
RDW: 14 % (ref 11.5–15.5)
WBC: 7 10*3/uL (ref 4.0–10.5)

## 2014-08-18 LAB — HIV ANTIBODY (ROUTINE TESTING W REFLEX): HIV 1&2 Ab, 4th Generation: NONREACTIVE

## 2014-08-18 LAB — PSA: PSA: 3.25 ng/mL (ref <=4.00)

## 2014-08-18 LAB — GC/CHLAMYDIA PROBE AMP
CT PROBE, AMP APTIMA: NEGATIVE
GC Probe RNA: NEGATIVE

## 2014-08-23 NOTE — Telephone Encounter (Signed)
error 

## 2016-01-02 ENCOUNTER — Encounter: Payer: Self-pay | Admitting: Medical

## 2016-01-02 ENCOUNTER — Ambulatory Visit (INDEPENDENT_AMBULATORY_CARE_PROVIDER_SITE_OTHER): Payer: Self-pay | Admitting: Medical

## 2016-01-02 VITALS — BP 128/82 | HR 59 | Ht 68.0 in | Wt 172.0 lb

## 2016-01-02 DIAGNOSIS — Z125 Encounter for screening for malignant neoplasm of prostate: Secondary | ICD-10-CM

## 2016-01-02 DIAGNOSIS — N503 Cyst of epididymis: Secondary | ICD-10-CM

## 2016-01-02 DIAGNOSIS — K759 Inflammatory liver disease, unspecified: Secondary | ICD-10-CM

## 2016-01-02 DIAGNOSIS — M79644 Pain in right finger(s): Secondary | ICD-10-CM

## 2016-01-02 DIAGNOSIS — D179 Benign lipomatous neoplasm, unspecified: Secondary | ICD-10-CM

## 2016-01-02 DIAGNOSIS — Z0000 Encounter for general adult medical examination without abnormal findings: Secondary | ICD-10-CM

## 2016-01-02 DIAGNOSIS — D1720 Benign lipomatous neoplasm of skin and subcutaneous tissue of unspecified limb: Secondary | ICD-10-CM | POA: Insufficient documentation

## 2016-01-02 DIAGNOSIS — D1779 Benign lipomatous neoplasm of other sites: Secondary | ICD-10-CM | POA: Insufficient documentation

## 2016-01-02 DIAGNOSIS — N401 Enlarged prostate with lower urinary tract symptoms: Secondary | ICD-10-CM | POA: Insufficient documentation

## 2016-01-02 DIAGNOSIS — R351 Nocturia: Secondary | ICD-10-CM

## 2016-01-02 DIAGNOSIS — Z1211 Encounter for screening for malignant neoplasm of colon: Secondary | ICD-10-CM

## 2016-01-02 DIAGNOSIS — F17200 Nicotine dependence, unspecified, uncomplicated: Secondary | ICD-10-CM

## 2016-01-02 DIAGNOSIS — Z113 Encounter for screening for infections with a predominantly sexual mode of transmission: Secondary | ICD-10-CM

## 2016-01-02 DIAGNOSIS — N400 Enlarged prostate without lower urinary tract symptoms: Secondary | ICD-10-CM

## 2016-01-02 LAB — CBC WITH DIFFERENTIAL/PLATELET
BASOS PCT: 0 %
Basophils Absolute: 0 cells/uL (ref 0–200)
EOS PCT: 4 %
Eosinophils Absolute: 268 cells/uL (ref 15–500)
HCT: 40.6 % (ref 38.5–50.0)
Hemoglobin: 13.3 g/dL (ref 13.2–17.1)
LYMPHS PCT: 42 %
Lymphs Abs: 2814 cells/uL (ref 850–3900)
MCH: 27.8 pg (ref 27.0–33.0)
MCHC: 32.8 g/dL (ref 32.0–36.0)
MCV: 84.8 fL (ref 80.0–100.0)
MONOS PCT: 8 %
MPV: 10.6 fL (ref 7.5–12.5)
Monocytes Absolute: 536 cells/uL (ref 200–950)
NEUTROS ABS: 3082 {cells}/uL (ref 1500–7800)
Neutrophils Relative %: 46 %
PLATELETS: 210 10*3/uL (ref 140–400)
RBC: 4.79 MIL/uL (ref 4.20–5.80)
RDW: 13.8 % (ref 11.0–15.0)
WBC: 6.7 10*3/uL (ref 4.0–10.5)

## 2016-01-02 LAB — HEPATIC FUNCTION PANEL
ALBUMIN: 4.1 g/dL (ref 3.6–5.1)
ALK PHOS: 47 U/L (ref 40–115)
ALT: 14 U/L (ref 9–46)
AST: 24 U/L (ref 10–35)
BILIRUBIN INDIRECT: 0.4 mg/dL (ref 0.2–1.2)
Bilirubin, Direct: 0.1 mg/dL (ref <=0.2)
TOTAL PROTEIN: 6.4 g/dL (ref 6.1–8.1)
Total Bilirubin: 0.5 mg/dL (ref 0.2–1.2)

## 2016-01-02 LAB — HEMOGLOBIN A1C
Hgb A1c MFr Bld: 5.6 % (ref <5.7)
Mean Plasma Glucose: 114 mg/dL

## 2016-01-02 LAB — BASIC METABOLIC PANEL
BUN: 16 mg/dL (ref 7–25)
CO2: 26 mmol/L (ref 20–31)
CREATININE: 1.03 mg/dL (ref 0.70–1.33)
Calcium: 9.3 mg/dL (ref 8.6–10.3)
Chloride: 103 mmol/L (ref 98–110)
Glucose, Bld: 76 mg/dL (ref 65–99)
Potassium: 4.3 mmol/L (ref 3.5–5.3)
Sodium: 138 mmol/L (ref 135–146)

## 2016-01-02 LAB — LIPID PANEL
CHOLESTEROL: 185 mg/dL (ref 125–200)
HDL: 57 mg/dL (ref >=40)
LDL CALC: 108 mg/dL (ref <130)
TRIGLYCERIDES: 98 mg/dL (ref <150)
Total CHOL/HDL Ratio: 3.2 Ratio (ref <=5.0)
VLDL: 20 mg/dL (ref <30)

## 2016-01-02 NOTE — Progress Notes (Signed)
Subjective:   HPI  Jorge Walker is a 50 y.o. male who presents for a complete physical.   Here for physical and concern for Hep C.  He donates plasma, and recently he failed the blood screen and there was concern for Hep C.  He denies prior hx/o IV drug use, not sure if he has ever had blood transfusion, denies other suspected exposures. No prior hx/o  Still has some nocturia, hesitant urine stream at times, hx/o BPH, but never took medication from last visit.  He has ongoing problem with right middle finger pain and can't straighten it fully.  Reviewed their medical, surgical, family, social, medication, and allergy history and updated chart as appropriate.  Past Medical History  Diagnosis Date  . Tobacco use disorder   . BPH (benign prostatic hypertrophy)   . Epididymal cyst     Past Surgical History  Procedure Laterality Date  . Artery repair      right upper arm - teenager, s/p trauma to right upper arm  . Arm wound repair / closure      right upper posterior arm - teenager, s/p trauma    Social History   Social History  . Marital Status: Single    Spouse Name: N/A  . Number of Children: N/A  . Years of Education: N/A   Occupational History  . Not on file.   Social History Main Topics  . Smoking status: Current Every Day Smoker -- 0.75 packs/day for 16 years  . Smokeless tobacco: Not on file  . Alcohol Use: 3.0 oz/week    5 Shots of liquor per week  . Drug Use: No  . Sexual Activity: Not on file   Other Topics Concern  . 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 Hypertension Mother   . Pneumonia Father     died of pneumonia  . Cancer Neg Hx   . Heart disease Neg Hx   . Stroke Neg Hx   .  Diabetes Neg Hx     No current outpatient prescriptions on file.  No Known Allergies   Review of Systems Constitutional: -fever, -chills, -sweats, -unexpected weight change, -decreased appetite, -fatigue Allergy: -sneezing, -itching, -congestion Dermatology: -changing moles, --rash, +lumps ENT: -runny nose, -ear pain, -sore throat, -hoarseness, -sinus pain, -teeth pain, - ringing in ears, -hearing loss, -nosebleeds Cardiology: -chest pain, -palpitations, -swelling, -difficulty breathing when lying flat, -waking up short of breath Respiratory: -cough, -shortness of breath, -difficulty breathing with exercise or exertion, -wheezing, -coughing up blood Gastroenterology: -abdominal pain, -nausea, -vomiting, -diarrhea, -constipation, -blood in stool, -changes in bowel movement, -difficulty swallowing or eating Hematology: -bleeding, -bruising  Musculoskeletal: +joint aches, -muscle aches, -joint swelling, -back pain, -neck pain, -cramping, -changes in gait Ophthalmology: denies vision changes, eye redness, itching, discharge Urology: -burning with urination, -difficulty urinating, -blood in urine, +urinary frequency, -urgency, -incontinence Neurology: -headache, -weakness, -tingling, -numbness, -memory loss, -falls, -dizziness Psychology: -depressed mood, -agitation, -sleep problems     Objective:   Physical Exam  BP 128/82 mmHg  Pulse 59  Ht 5\' 8"  (1.727 m)  Wt 172 lb (78.019 kg)  BMI 26.16 kg/m2   General appearance: alert, no distress, WD/WN, lean AA male Skin:right triceps  region with prior trauma scar, no other worrisome lesions HEENT: normocephalic, conjunctiva/corneas normal, sclerae anicteric, PERRLA, EOMi, nares patent, no discharge or erythema, pharynx normal Oral cavity: MMM, tongue normal, teeth with upper dentures, otherwise in good repair Neck: supple, no lymphadenopathy, no thyromegaly, no masses, normal ROM, no bruits Chest: non tender, normal shape and  expansion Heart: RRR, normal S1, S2, no murmurs Lungs: CTA bilaterally, no wheezes, rhonchi, or rales Abdomen: +bs, soft, non tender, non distended, no masses, no hepatomegaly, no splenomegaly, no bruits Back: non tender, normal ROM, no scoliosis Musculoskeletal: left buttock midline with 3-4cm deep round palpable mass, lipoma, nontender, left upper anterior thigh with 4cm not well defined but fatty tissue lump most likely lipoma, otherwise upper extremities non tender, no obvious deformity, normal ROM throughout, lower extremities non tender, no obvious deformity, normal ROM throughout Extremities: no edema, no cyanosis, no clubbing Pulses: 2+ symmetric, upper and lower extremities, normal cap refill Neurological: alert, oriented x 3, CN2-12 intact, strength normal upper extremities and lower extremities, sensation normal throughout, DTRs 2+ throughout, no cerebellar signs, gait normal Psychiatric: normal affect, behavior normal, pleasant  GU: normal male external genitalia, large multiple nodules round and hard bilat superior to testes suggestive of epididymal cysts as noted on prior 2009 ultrasound, but seem larger, somewhat tender, left largest is >1.3 cm diameter, no hernia, no lymphadenopathy Rectal: small external hemorrhoids, prostate moderately enlarged, no palpable nodules, occult negative stool   Assessment and Plan :   Encounter Diagnoses  Name Primary?  . Encounter for health maintenance examination in adult Yes  . Special screening for malignant neoplasms, colon   . Screening for prostate cancer   . BPH (benign prostatic hypertrophy)   . Nocturia   . Hepatitis   . Tobacco use disorder   . Screen for STD (sexually transmitted disease)   . Finger pain, right   . Epididymal cyst   . Lipoma    Discussed preventative care, routine f/u, yearly eye doctor and dentis f/u, exercise, healthy diet, vaccines.  He is not having problems with the spermatoceles or lumps/lipomas, thus,  no additional treatment at this time.  Reassured they are benign findings BPH - f/u pending labs Advised yearly flu shot Referral for first colonoscopy Refer to ortho for finger pain and decrease extension, right middle finger spermatocele and lipomas unchanged from prior exam Advised cessation of tobacco F/u pending labs

## 2016-01-03 ENCOUNTER — Other Ambulatory Visit: Payer: Self-pay | Admitting: Medical

## 2016-01-03 LAB — HEPATITIS PANEL, ACUTE
HCV AB: NEGATIVE
HEP B C IGM: NONREACTIVE
HEP B S AG: NEGATIVE
Hep A IgM: NONREACTIVE

## 2016-01-03 LAB — HIV ANTIBODY (ROUTINE TESTING W REFLEX): HIV: NONREACTIVE

## 2016-01-03 LAB — GC/CHLAMYDIA PROBE AMP
CT PROBE, AMP APTIMA: NOT DETECTED
GC PROBE AMP APTIMA: NOT DETECTED

## 2016-01-03 LAB — PSA: PSA: 3.26 ng/mL (ref <=4.00)

## 2016-01-03 LAB — RPR

## 2016-01-03 MED ORDER — TAMSULOSIN HCL 0.4 MG PO CAPS: 0.4000 mg | ORAL_CAPSULE | Freq: Every day | ORAL | Status: DC

## 2016-01-09 ENCOUNTER — Ambulatory Visit (AMBULATORY_SURGERY_CENTER): Payer: Self-pay | Admitting: *Deleted

## 2016-01-09 ENCOUNTER — Encounter: Payer: Self-pay | Admitting: Internal Medicine

## 2016-01-09 VITALS — Ht 68.5 in | Wt 172.0 lb

## 2016-01-09 DIAGNOSIS — Z1211 Encounter for screening for malignant neoplasm of colon: Secondary | ICD-10-CM

## 2016-01-09 MED ORDER — NA SULFATE-K SULFATE-MG SULF 17.5-3.13-1.6 GM/177ML PO SOLN: 1.0000 | Freq: Once | ORAL | Status: DC

## 2016-01-09 NOTE — Progress Notes (Signed)
No egg or soy allergy known to patient  No issues with past sedation with any surgeries  or procedures, no intubation problems  No diet pills per patient No home 02 use per patient  No blood thinners per patient  Pt denies issues with constipation  emmi declined Pt has [employer] select which will not cover suprep- sample given Samples of this drug were given to the patient, quantity 1 suprep , Lot Number [id number] exp 3-19

## 2016-01-24 ENCOUNTER — Encounter: Payer: Self-pay | Admitting: Internal Medicine

## 2016-01-24 ENCOUNTER — Ambulatory Visit (AMBULATORY_SURGERY_CENTER): Payer: BLUE CROSS/BLUE SHIELD | Admitting: Internal Medicine

## 2016-01-24 VITALS — BP 117/82 | HR 51 | Temp 97.7°F | Resp 8 | Ht 68.5 in | Wt 172.0 lb

## 2016-01-24 DIAGNOSIS — Z1211 Encounter for screening for malignant neoplasm of colon: Secondary | ICD-10-CM

## 2016-01-24 HISTORY — PX: COLONOSCOPY: SHX174

## 2016-01-24 MED ORDER — SODIUM CHLORIDE 0.9 % IV SOLN: 500.0000 mL | INTRAVENOUS | Status: DC

## 2016-01-24 NOTE — Patient Instructions (Signed)
Diverticulosis seen today. Handout given on diverticulosis and high fiber diet. Repeat colonoscopy in 10 years. Resume current medications. Call us with any questions or concerns. Thank you!!  YOU HAD AN ENDOSCOPIC PROCEDURE TODAY AT [facility]:   Refer to the procedure report that was given to you for any specific questions about what was found during the examination.  If the procedure report does not answer your questions, please call your gastroenterologist to clarify.  If you requested that your care partner not be given the details of your procedure findings, then the procedure report has been included in a sealed envelope for you to review at your convenience later.  YOU SHOULD EXPECT: Some feelings of bloating in the abdomen. Passage of more gas than usual.  Walking can help get rid of the air that was put into your GI tract during the procedure and reduce the bloating. If you had a lower endoscopy (such as a colonoscopy or flexible sigmoidoscopy) you may notice spotting of blood in your stool or on the toilet paper. If you underwent a bowel prep for your procedure, you may not have a normal bowel movement for a few days.  Please Note:  You might notice some irritation and congestion in your nose or some drainage.  This is from the oxygen used during your procedure.  There is no need for concern and it should clear up in a day or so.  SYMPTOMS TO REPORT IMMEDIATELY:   Following lower endoscopy (colonoscopy or flexible sigmoidoscopy):  Excessive amounts of blood in the stool  Significant tenderness or worsening of abdominal pains  Swelling of the abdomen that is new, acute  Fever of 100F or higher   For urgent or emergent issues, a gastroenterologist can be reached at any hour by calling [phone].   DIET: Your first meal following the procedure should be a small meal and then it is ok to progress to your normal diet. Heavy or fried foods are harder to digest and  may make you feel nauseous or bloated.  Likewise, meals heavy in dairy and vegetables can increase bloating.  Drink plenty of fluids but you should avoid alcoholic beverages for 24 hours.  ACTIVITY:  You should plan to take it easy for the rest of today and you should NOT DRIVE or use heavy machinery until tomorrow (because of the sedation medicines used during the test).    FOLLOW UP: Our staff will call the number listed on your records the next business day following your procedure to check on you and address any questions or concerns that you may have regarding the information given to you following your procedure. If we do not reach you, we will leave a message.  However, if you are feeling well and you are not experiencing any problems, there is no need to return our call.  We will assume that you have returned to your regular daily activities without incident.  If any biopsies were taken you will be contacted by phone or by letter within the next 1-3 weeks.  Please call us at [phone] if you have not heard about the biopsies in 3 weeks.    SIGNATURES/CONFIDENTIALITY: You and/or your care partner have signed paperwork which will be entered into your electronic medical record.  These signatures attest to the fact that that the information above on your After Visit Summary has been reviewed and is understood.  Full responsibility of the confidentiality of this discharge information lies with  you and/or your care-partner.

## 2016-01-24 NOTE — Op Note (Signed)
[facility] Patient Name: Jorge Walker Procedure Date: 01/24/2016 8:58 AM MRN: [id number] Endoscopist: Docia Chuck. Henrene Pastor , MD Age: 50 Referring MD:  Date of Birth: 1966/01/23 Gender: Male Procedure:                Colonoscopy Indications:              Screening for colorectal malignant neoplasm Medicines:                Monitored Anesthesia Care Procedure:                Pre-Anesthesia Assessment:                           - Prior to the procedure, a History and Physical                            was performed, and patient medications and                            allergies were reviewed. The patient's tolerance of                            previous anesthesia was also reviewed. The risks                            and benefits of the procedure and the sedation                            options and risks were discussed with the patient.                            All questions were answered, and informed consent                            was obtained. Prior Anticoagulants: The patient has                            taken no previous anticoagulant or antiplatelet                            agents. ASA Grade Assessment: II - A patient with                            mild systemic disease. After reviewing the risks                            and benefits, the patient was deemed in                            satisfactory condition to undergo the procedure.                           After obtaining informed consent, the colonoscope  was passed under direct vision. Throughout the                            procedure, the patient's blood pressure, pulse, and                            oxygen saturations were monitored continuously. The                            Model PCF-[id number]) scope was introduced                            through the anus and advanced to the the cecum,                            identified by appendiceal orifice and  ileocecal                            valve. The ileocecal valve, appendiceal orifice,                            and rectum were photographed. The quality of the                            bowel preparation was excellent. The colonoscopy                            was performed without difficulty. The patient                            tolerated the procedure well. The bowel preparation                            used was SUPREP. Scope In: 9:09:20 AM Scope Out: 9:24:01 AM Scope Withdrawal Time: 0 hours 12 minutes 10 seconds  Total Procedure Duration: 0 hours 14 minutes 41 seconds  Findings:                 A few small-mouthed diverticula were found in the                            left colon and right colon.                           The exam was otherwise without abnormality on                            direct and retroflexion views. Complications:            No immediate complications. Estimated blood loss:                            None. Estimated Blood Loss:     Estimated blood loss: none. Impression:               - Diverticulosis in the  left colon and in the right                            colon.                           - The examination was otherwise normal on direct                            and retroflexion views.                           - No specimens collected. Recommendation:           - Repeat colonoscopy in 10 years for screening                            purposes.                           - Patient has a contact number available for                            emergencies. The signs and symptoms of potential                            delayed complications were discussed with the                            patient. Return to normal activities tomorrow.                            Written discharge instructions were provided to the                            patient.                           - Resume previous diet.                           - Continue present  medications. Docia Chuck. Henrene Pastor, MD 01/24/2016 9:28:42 AM This report has been signed electronically.

## 2016-01-24 NOTE — Progress Notes (Signed)
Patient awakening,vss,report to rn 

## 2016-01-25 ENCOUNTER — Telehealth: Payer: Self-pay | Admitting: *Deleted

## 2016-01-25 ENCOUNTER — Telehealth: Payer: Self-pay | Admitting: Internal Medicine

## 2016-01-25 NOTE — Telephone Encounter (Signed)
Left message for pt to call back  °

## 2016-01-25 NOTE — Telephone Encounter (Signed)
Discussed with pt that this is usually not the case. States he is feeling fine now. Instructed pt to call back if any other issues.

## 2016-01-25 NOTE — Telephone Encounter (Signed)
Left message on f/u call 

## 2016-02-01 ENCOUNTER — Telehealth: Payer: Self-pay | Admitting: Medical

## 2016-02-01 NOTE — Telephone Encounter (Signed)
Pt called and cancelled follow up appt for Monday. He states that he hasnt started flomax yet. He will call to reschedule once he starts taking.

## 2016-02-05 ENCOUNTER — Ambulatory Visit: Payer: BLUE CROSS/BLUE SHIELD | Admitting: Medical

## 2016-10-04 ENCOUNTER — Encounter: Payer: Self-pay | Admitting: Medical

## 2016-10-04 ENCOUNTER — Ambulatory Visit (INDEPENDENT_AMBULATORY_CARE_PROVIDER_SITE_OTHER): Payer: No Typology Code available for payment source | Admitting: Medical

## 2016-10-04 VITALS — BP 132/80 | HR 58 | Wt 171.4 lb

## 2016-10-04 DIAGNOSIS — N503 Cyst of epididymis: Secondary | ICD-10-CM | POA: Diagnosis not present

## 2016-10-04 DIAGNOSIS — D1720 Benign lipomatous neoplasm of skin and subcutaneous tissue of unspecified limb: Secondary | ICD-10-CM

## 2016-10-04 DIAGNOSIS — D1779 Benign lipomatous neoplasm of other sites: Secondary | ICD-10-CM | POA: Diagnosis not present

## 2016-10-04 DIAGNOSIS — D171 Benign lipomatous neoplasm of skin and subcutaneous tissue of trunk: Secondary | ICD-10-CM | POA: Diagnosis not present
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
## 2017-03-01 ENCOUNTER — Ambulatory Visit (HOSPITAL_COMMUNITY)
Admission: EM | Admit: 2017-03-01 | Discharge: 2017-03-01 | Disposition: A | Discharge status: Home / Self Care | Payer: Self-pay | Attending: Family Medicine | Admitting: Family Medicine

## 2017-03-01 ENCOUNTER — Encounter (HOSPITAL_COMMUNITY): Payer: Self-pay | Admitting: *Deleted

## 2017-03-01 DIAGNOSIS — M25562 Pain in left knee: Secondary | ICD-10-CM

## 2017-03-01 MED ORDER — DICLOFENAC SODIUM 75 MG PO TBEC: 75.0000 mg | DELAYED_RELEASE_TABLET | Freq: Two times a day (BID) | ORAL | 0 refills | Status: DC

## 2017-03-01 NOTE — ED Triage Notes (Signed)
C/o  l  Knee    Pain   For    A    While      Started   Swelling   Off    And  On    Pain  Worse  Today    Denies   Any  Other  injurys

## 2017-03-01 NOTE — ED Provider Notes (Signed)
  [facility]   [id number] 03/01/17 Arrival Time: 0737  ASSESSMENT & PLAN:  Today you were diagnosed with the following: 1. Left knee pain, unspecified chronicity    You have been prescribed prescription medications this visit.   Meds ordered this encounter  Medications  . diclofenac (VOLTAREN) 75 MG EC tablet    Sig: Take 1 tablet (75 mg total) by mouth 2 (two) times daily.    Dispense:  14 tablet    Refill:  0   Please take all medications as directed with food. If your knee pain does not improve I would recommend scheduling an appointment with an orthopaedist for evaluation.  If you are not improving over the next few days or feel you are worsening please follow up here or the Emergency Department if you are unable to see your regular doctor.  Your blood pressure was noted to be elevated during your visit today. You may return here within the next few days or when you are well to recheck if unable to see your primary care doctor.  Reviewed expectations re: course of current medical issues. Questions answered. Outlined signs and symptoms indicating need for more acute intervention. Patient verbalized understanding. After Visit Summary given.   SUBJECTIVE:  Jorge Walker is a 51 y.o. male who presents with complaint of several months of L knee discomfort. No injury or trauma reported. Ambulatory. Flares up occasionally. Current symptoms this past week. Generalized soreness without ROM loss. Reports swelling at times. No self treatment. No specific aggravating or alleviating factors reported. No locking or giving out of knee.  ROS: As per HPI.   OBJECTIVE:  Vitals:   03/01/17 1515  BP: (!) 138/92  Pulse: 60  Resp: 18  Temp: 98.8 F (37.1 C)  TempSrc: Oral  SpO2: 100%    General appearance: alert; no distress Lungs: clear to auscultation bilaterally Heart: regular rate and rhythm Extremities: extremities normal, atraumatic, no cyanosis or edema L knee  with very slight swelling compared to the right; FROM; no bony tenderness MSK: symmetrical with no gross deformities Skin: warm and dry  No Known Allergies  PMHx, SurgHx, SocialHx, Medications, and Allergies were reviewed in the Visit Navigator and updated as appropriate.      Vanessa Kick, MD 03/01/17 [phone]

## 2017-03-01 NOTE — Discharge Instructions (Addendum)
Today you were diagnosed with the following: 1. Left knee pain, unspecified chronicity    You have been prescribed prescription medications this visit.   Meds ordered this encounter  Medications   diclofenac (VOLTAREN) 75 MG EC tablet    Sig: Take 1 tablet (75 mg total) by mouth 2 (two) times daily.    Dispense:  14 tablet    Refill:  0   Please take all medications as directed with food. If your knee pain does not improve I would recommend scheduling an appointment with an orthopaedist for evaluation.  If you are not improving over the next few days or feel you are worsening please follow up here or the Emergency Department if you are unable to see your regular doctor.  Your blood pressure was noted to be elevated during your visit today. You may return here within the next few days or when you are well to recheck if unable to see your primary care doctor.

## 2017-08-06 ENCOUNTER — Emergency Department (HOSPITAL_COMMUNITY)
Admission: EM | Admit: 2017-08-06 | Discharge: 2017-08-06 | Disposition: A | Discharge status: Home / Self Care | Payer: No Typology Code available for payment source | Attending: Emergency Medicine | Admitting: Emergency Medicine

## 2017-08-06 ENCOUNTER — Encounter (HOSPITAL_COMMUNITY): Payer: Self-pay

## 2017-08-06 DIAGNOSIS — S161XXA Strain of muscle, fascia and tendon at neck level, initial encounter: Secondary | ICD-10-CM | POA: Insufficient documentation

## 2017-08-06 DIAGNOSIS — F17210 Nicotine dependence, cigarettes, uncomplicated: Secondary | ICD-10-CM | POA: Insufficient documentation

## 2017-08-06 DIAGNOSIS — Y92410 Unspecified street and highway as the place of occurrence of the external cause: Secondary | ICD-10-CM | POA: Diagnosis not present

## 2017-08-06 DIAGNOSIS — Y999 Unspecified external cause status: Secondary | ICD-10-CM | POA: Diagnosis not present

## 2017-08-06 DIAGNOSIS — Y9389 Activity, other specified: Secondary | ICD-10-CM | POA: Diagnosis not present

## 2017-08-06 DIAGNOSIS — S199XXA Unspecified injury of neck, initial encounter: Secondary | ICD-10-CM | POA: Diagnosis present

## 2017-08-06 MED ORDER — CYCLOBENZAPRINE HCL 10 MG PO TABS: 10.0000 mg | ORAL_TABLET | Freq: Two times a day (BID) | ORAL | 0 refills | Status: DC | PRN

## 2017-08-06 MED ORDER — ACETAMINOPHEN 325 MG PO TABS
650.0000 mg | ORAL_TABLET | Freq: Once | ORAL | Status: AC
  Administered 2017-08-06: 650 mg via ORAL
  Filled 2017-08-06: qty 2

## 2017-08-06 MED ORDER — CYCLOBENZAPRINE HCL 10 MG PO TABS
10.0000 mg | ORAL_TABLET | Freq: Once | ORAL | Status: AC
  Administered 2017-08-06: 10 mg via ORAL
  Filled 2017-08-06: qty 1
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
## 2017-08-06 NOTE — ED Notes (Signed)
Patient complaining of neck and head pain.  Did not hit head.  No LOC.

## 2017-08-26 HISTORY — PX: ELBOW SURGERY: SHX618

## 2017-10-05 ENCOUNTER — Other Ambulatory Visit: Payer: Self-pay

## 2017-10-05 ENCOUNTER — Encounter (HOSPITAL_COMMUNITY): Payer: Self-pay

## 2017-10-05 ENCOUNTER — Emergency Department (HOSPITAL_COMMUNITY): Payer: Managed Care, Other (non HMO)

## 2017-10-05 ENCOUNTER — Emergency Department (HOSPITAL_COMMUNITY)
Admission: EM | Admit: 2017-10-05 | Discharge: 2017-10-05 | Disposition: A | Discharge status: Home / Self Care | Payer: Managed Care, Other (non HMO) | Attending: Emergency Medicine | Admitting: Emergency Medicine

## 2017-10-05 DIAGNOSIS — B9789 Other viral agents as the cause of diseases classified elsewhere: Secondary | ICD-10-CM | POA: Insufficient documentation

## 2017-10-05 DIAGNOSIS — R0981 Nasal congestion: Secondary | ICD-10-CM | POA: Diagnosis present

## 2017-10-05 DIAGNOSIS — M19042 Primary osteoarthritis, left hand: Secondary | ICD-10-CM

## 2017-10-05 DIAGNOSIS — J069 Acute upper respiratory infection, unspecified: Secondary | ICD-10-CM | POA: Diagnosis not present

## 2017-10-05 DIAGNOSIS — F17210 Nicotine dependence, cigarettes, uncomplicated: Secondary | ICD-10-CM | POA: Insufficient documentation

## 2017-10-05 IMAGING — DX DG HAND COMPLETE 3+V*L*
3 series · 3 of 3 positions shown · non-contrast
Comparison: None.

CLINICAL DATA: Left hand pain.  Initial encounter.

EXAM:
LEFT HAND - COMPLETE 3+ VIEW

[hand pa]
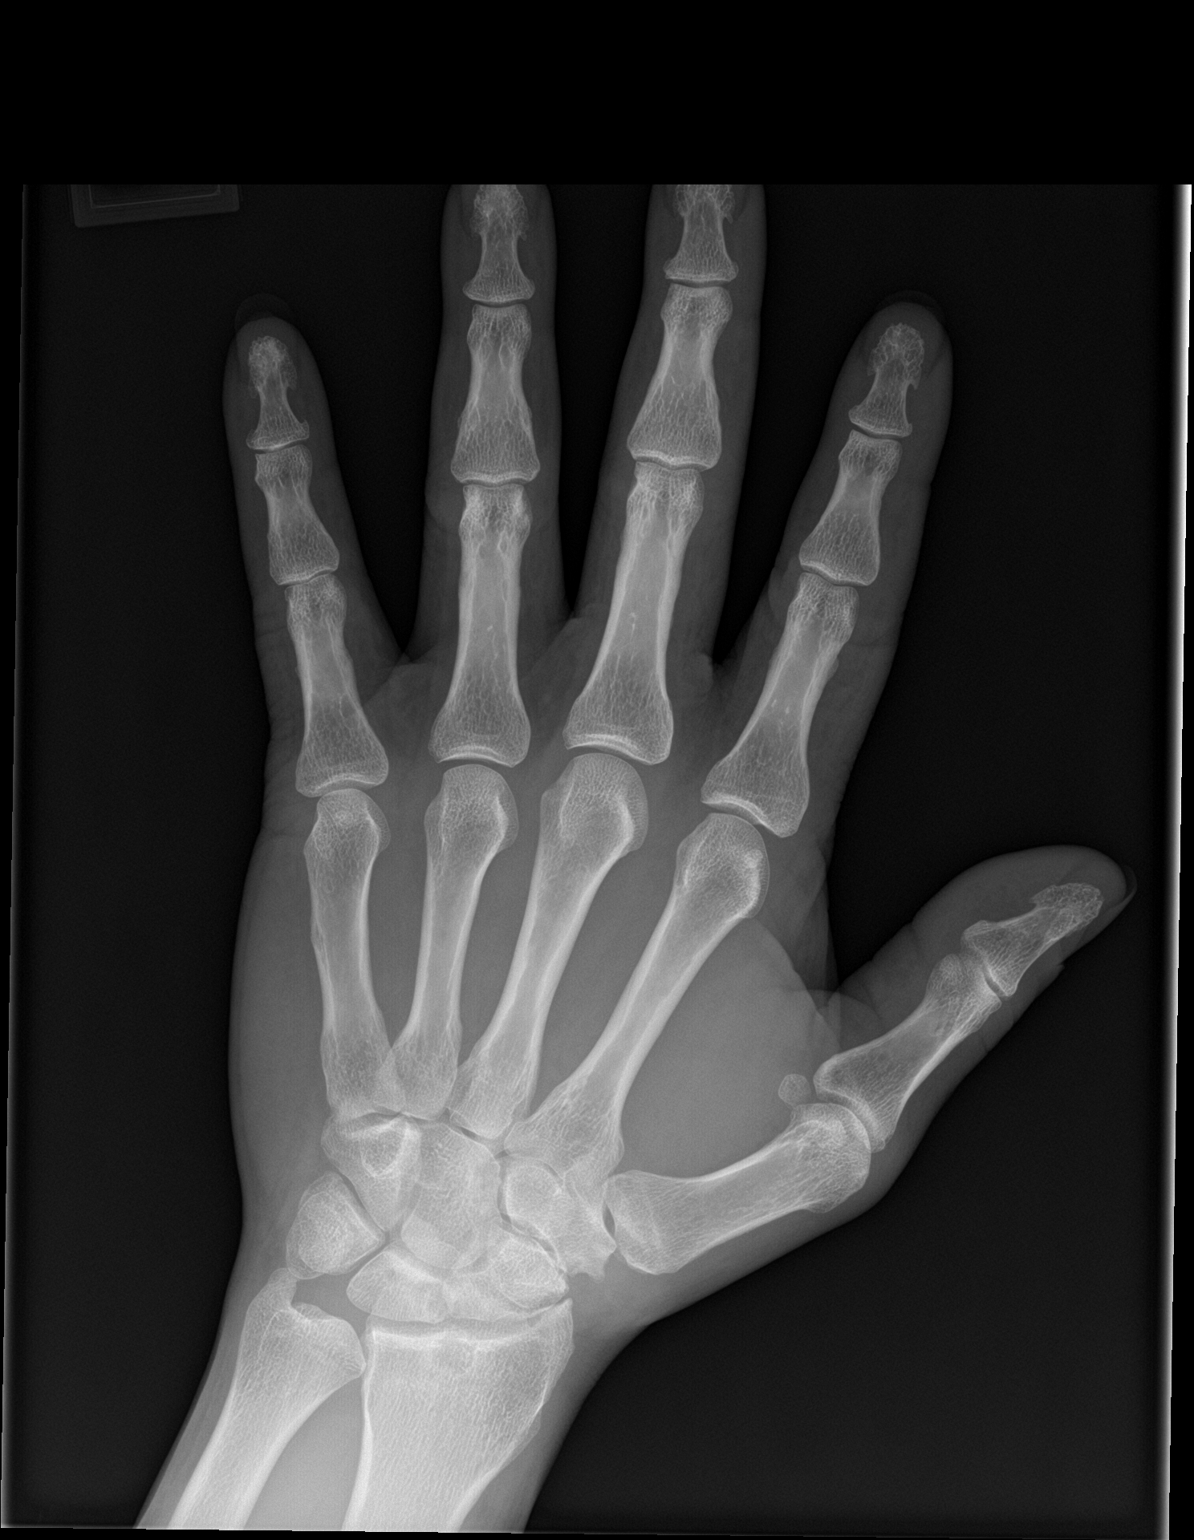

[hand obl]
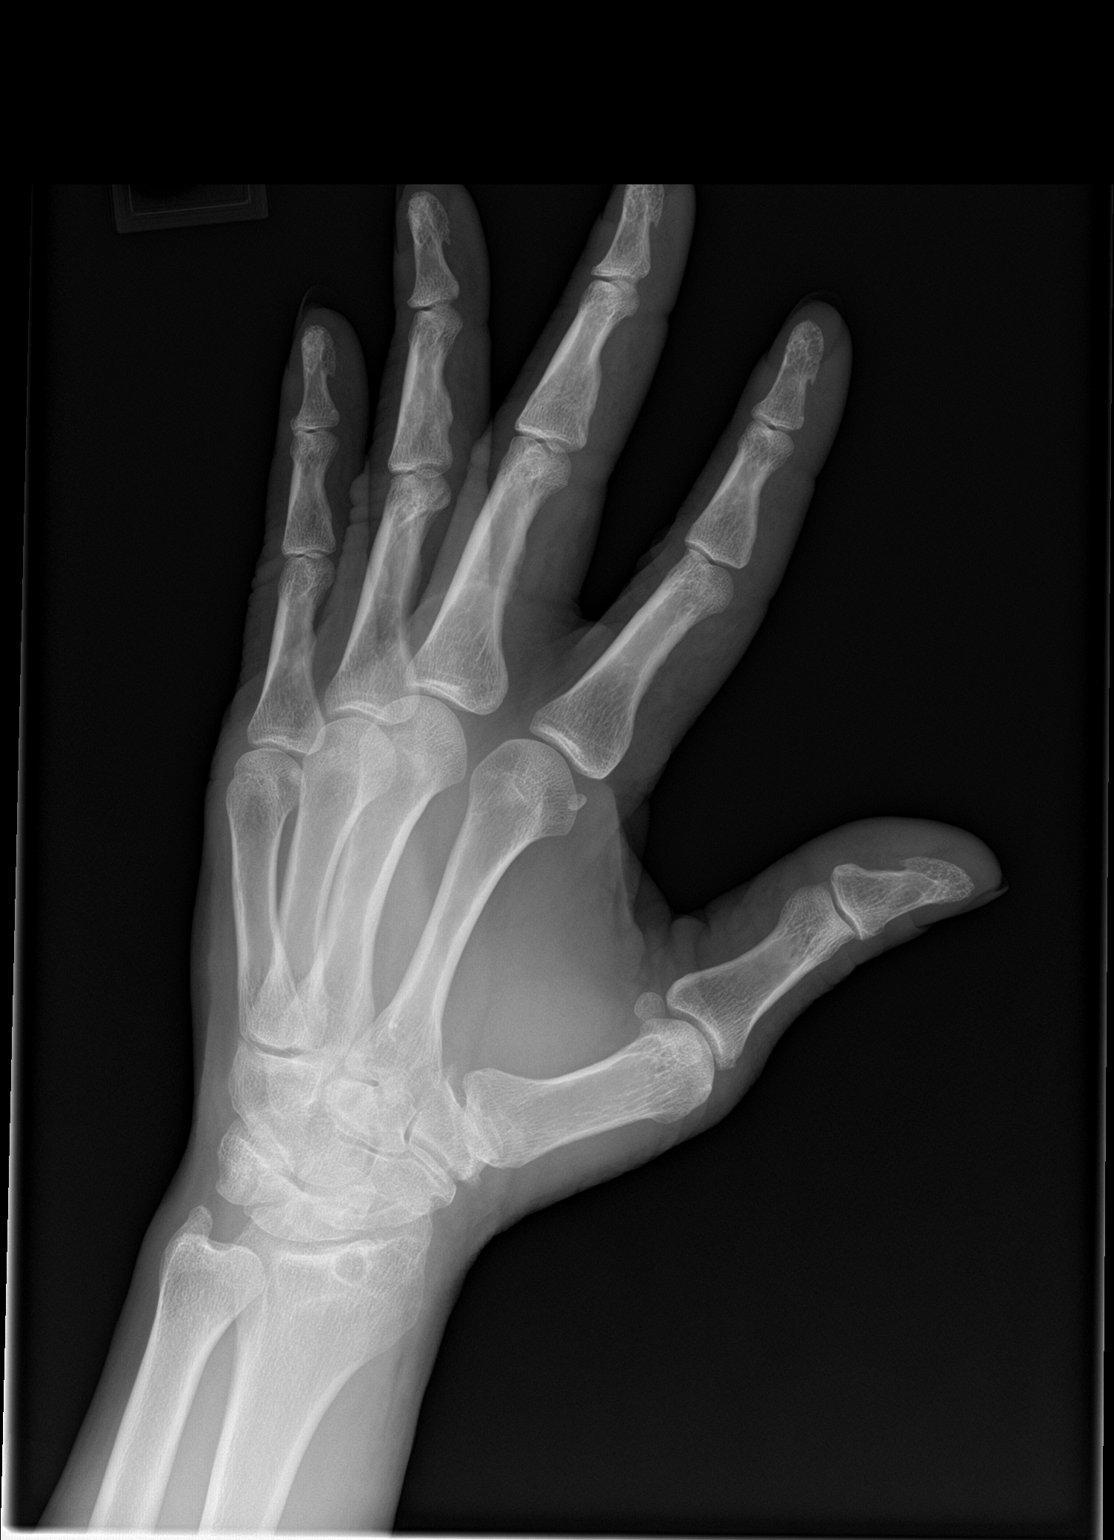

[hand lat]
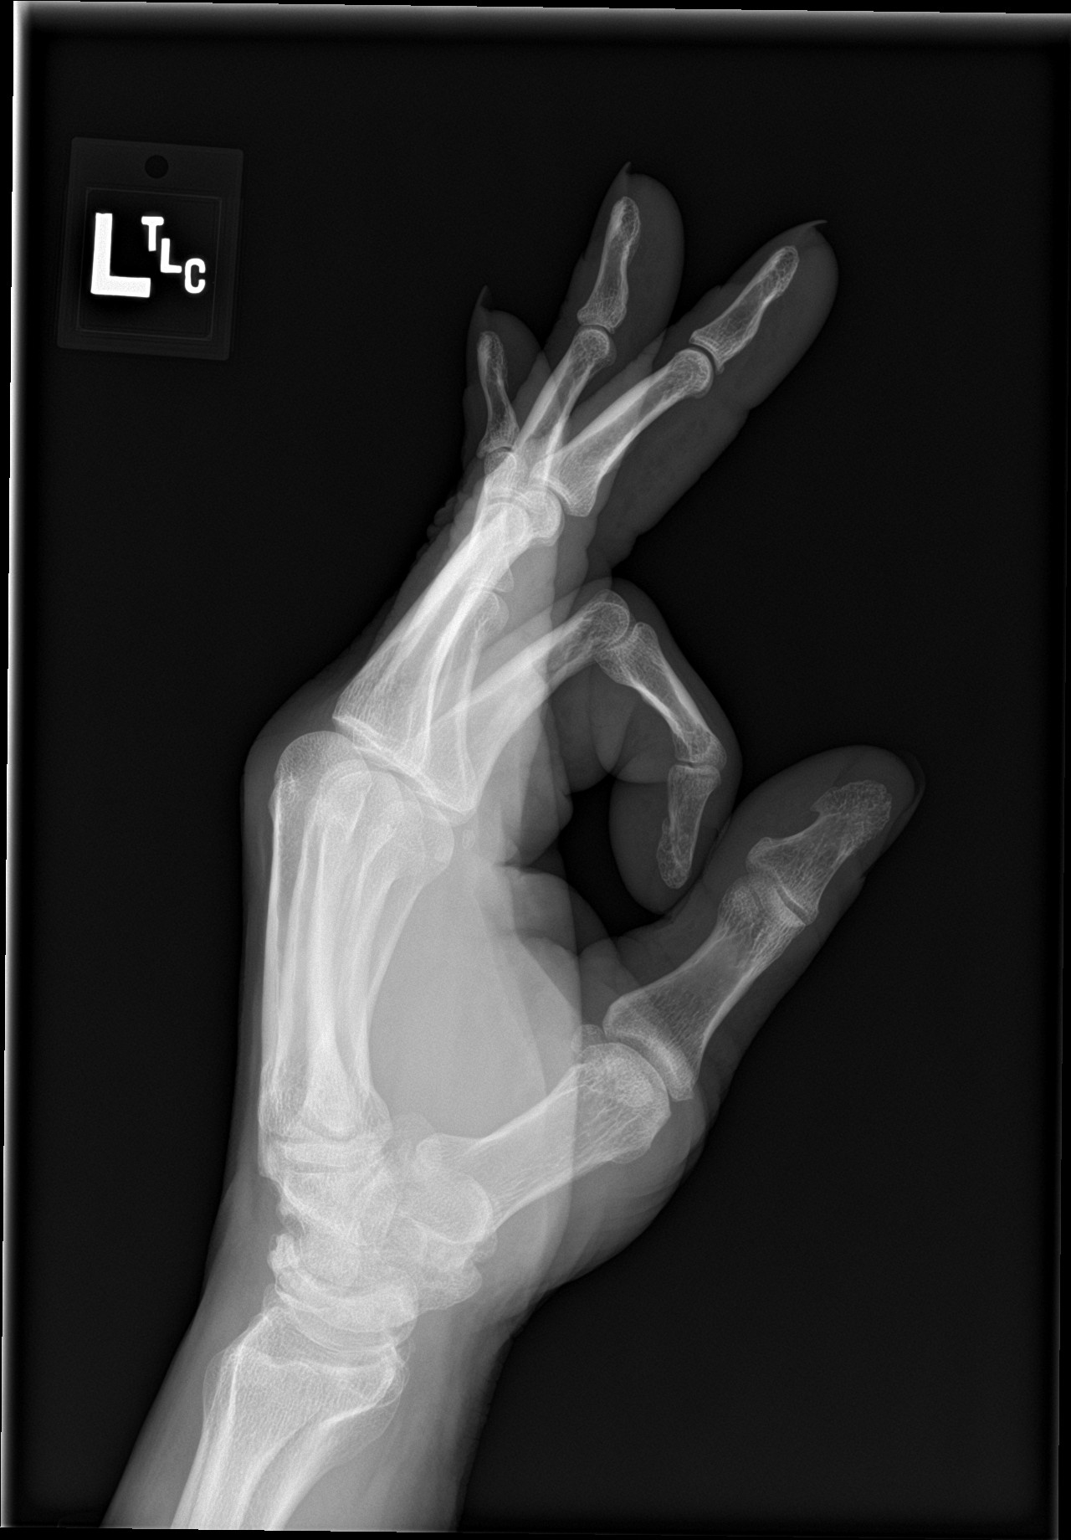

[3 of 3 positions shown; findings below may reference images not displayed]

FINDINGS: There is no evidence of acute fracture, subluxation or dislocation.

Moderate degenerative changes at the radiocarpal joint noted.

Soft tissue structures are unremarkable.
IMPRESSION: No evidence of acute abnormality.

Moderate radiocarpal joint degenerative changes.

## 2017-10-05 MED ORDER — NAPROXEN 500 MG PO TABS: 500.0000 mg | ORAL_TABLET | Freq: Two times a day (BID) | ORAL | 0 refills | Status: DC

## 2017-10-05 MED ORDER — FLUTICASONE PROPIONATE 50 MCG/ACT NA SUSP: 1.0000 | Freq: Every day | NASAL | 2 refills | Status: DC

## 2017-10-05 MED ORDER — BENZONATATE 100 MG PO CAPS: 100.0000 mg | ORAL_CAPSULE | Freq: Three times a day (TID) | ORAL | 0 refills | Status: DC

## 2017-10-05 NOTE — ED Provider Notes (Signed)
[facility] EMERGENCY DEPARTMENT Provider Note   CSN: [id number] Arrival date & time: 10/05/17  0920     History   Chief Complaint Chief Complaint  Patient presents with  . cold symptoms/finger pain    HPI AIRON Walker is a 52 y.o. male with a past medical history of arthritis, tobacco abuse, who presents to ED for evaluation of the fine his first complaint is cold symptoms including nasal congestion, dry cough, intermittent fevers for the past week.  Sick contacts at home with similar symptoms.  He has not tried any over-the-counter medications to help with symptoms.  He did not receive his influenza vaccine this year.  He denies any chest pain, shortness of breath, wheezing, trouble swallowing, sore throat. His next complaint is bilateral hand and finger discomfort.  He states that he has had some pain and swelling in bilateral index fingers, middle fingers, ring fingers for the past several months.  Pain in his left hand got worse the past 2 days as well as swelling.  He does have a history of arthritis of the right hand but is unsure if this is what is going on the left hand.  Denies any numbness, injury or trauma to site, redness of joints, prior fracture, dislocations or procedures in the area.  HPI  Past Medical History:  Diagnosis Date  . Allergy   . Arthritis    finger  . BPH (benign prostatic hypertrophy)   . Epididymal cyst   . Tobacco use disorder     Patient Active Problem List   Diagnosis Date Noted  . Encounter for health maintenance examination in adult 01/02/2016  . Special screening for malignant neoplasms, colon 01/02/2016  . Screening for prostate cancer 01/02/2016  . BPH (benign prostatic hypertrophy) 01/02/2016  . Nocturia 01/02/2016  . Hepatitis 01/02/2016  . Screen for STD (sexually transmitted disease) 01/02/2016  . Finger pain, right 01/02/2016  . Lipoma of extremity 01/02/2016  . Epididymal cyst 07/07/2008  . TOBACCO ABUSE  04/28/2008  . DENTAL CARIES 04/28/2008    Past Surgical History:  Procedure Laterality Date  . ARM WOUND REPAIR / CLOSURE     right upper posterior arm - teenager, s/p trauma  . ARTERY REPAIR     right upper arm - teenager, s/p trauma to right upper arm       Home Medications    Prior to Admission medications   Medication Sig Start Date End Date Taking? Authorizing Provider  benzonatate (TESSALON) 100 MG capsule Take 1 capsule (100 mg total) by mouth every 8 (eight) hours. 10/05/17   Jorge Morgan, PA-C  cyclobenzaprine (FLEXERIL) 10 MG tablet Take 1 tablet (10 mg total) by mouth 2 (two) times daily as needed for muscle spasms. 08/06/17   Walker, Jorge A, PA-C  fluticasone (FLONASE) 50 MCG/ACT nasal spray Place 1 spray into both nostrils daily. 10/05/17   Jorge Trefz, PA-C  naproxen (NAPROSYN) 500 MG tablet Take 1 tablet (500 mg total) by mouth 2 (two) times daily. 10/05/17   Jorge Heady, PA-C    Family History Family History  Problem Relation Age of Onset  . Hypertension Mother   . Pneumonia Father        died of pneumonia  . Cancer Neg Hx   . Heart disease Neg Hx   . Stroke Neg Hx   . Diabetes Neg Hx   . Colon cancer Neg Hx   . Colon polyps Neg Hx   . Esophageal cancer Neg  Hx   . Rectal cancer Neg Hx   . Stomach cancer Neg Hx     Social History Social History   Tobacco Use  . Smoking status: Current Every Day Smoker    Packs/day: 0.50    Years: 16.00    Pack years: 8.00    Types: Cigarettes  . Smokeless tobacco: Never Used  Substance Use Topics  . Alcohol use: Yes    Alcohol/week: 3.0 oz    Types: 5 Shots of liquor per week    Comment: weekends   . Drug use: No     Allergies   Patient has no known allergies.   Review of Systems Review of Systems  Constitutional: Positive for fever. Negative for chills.  HENT: Positive for congestion, rhinorrhea and sneezing. Negative for drooling, ear discharge, facial swelling, hearing loss, sinus pressure, trouble  swallowing and voice change.   Respiratory: Positive for cough. Negative for shortness of breath.   Gastrointestinal: Negative for nausea and vomiting.  Musculoskeletal: Positive for arthralgias. Negative for back pain, gait problem, joint swelling and neck stiffness.  Skin: Negative for color change and wound.  Neurological: Negative for weakness, numbness and headaches.     Physical Exam Updated Vital Signs BP (!) 148/96 (BP Location: Right Arm)   Pulse 62   Temp 98.7 F (37.1 C) (Oral)   Resp 16   Ht 5\' 9"  (1.753 m)   Wt 82.6 kg (182 lb)   SpO2 100%   BMI 26.88 kg/m   Physical Exam  Constitutional: He appears well-developed and well-nourished. No distress.  HENT:  Head: Normocephalic and atraumatic.  Right Ear: Tympanic membrane normal.  Left Ear: Tympanic membrane normal.  Nose: Rhinorrhea present.  Mouth/Throat: Uvula is midline. No trismus in the jaw. No uvula swelling. No tonsillar abscesses. No tonsillar exudate.  Eyes: Conjunctivae and EOM are normal. No scleral icterus.  Neck: Normal range of motion.  Pulmonary/Chest: Effort normal. No respiratory distress.  Musculoskeletal: Normal range of motion. He exhibits tenderness. He exhibits no edema or deformity.  Diffuse tenderness to palpation of the left index, middle and ring fingers.  No visible deformity although there is a chronic deformity noted of the right middle finger that appears c/w arthritis.  Sensation intact to light touch of bilateral hands.  Equal grip strength bilaterally.  2+ radial pulse noted bilaterally.  No discoloration, bruising or temperature change of skin.  Neurological: He is alert.  Skin: No rash noted. He is not diaphoretic.  Psychiatric: He has a normal mood and affect.  Nursing note and vitals reviewed.    ED Treatments / Results  Labs (all labs ordered are listed, but only abnormal results are displayed) Labs Reviewed - No data to display  EKG  EKG Interpretation None        Radiology Dg Hand Complete Left  Result Date: 10/05/2017 CLINICAL DATA:  Left hand pain.  Initial encounter. EXAM: LEFT HAND - COMPLETE 3+ VIEW COMPARISON:  None. FINDINGS: There is no evidence of acute fracture, subluxation or dislocation. Moderate degenerative changes at the radiocarpal joint noted. Soft tissue structures are unremarkable. IMPRESSION: No evidence of acute abnormality. Moderate radiocarpal joint degenerative changes. Electronically Signed   By: Margarette Canada M.D.   On: 10/05/2017 10:49    Procedures Procedures (including critical care time)  Medications Ordered in ED Medications - No data to display   Initial Impression / Assessment and Plan / ED Course  I have reviewed the triage vital signs and the nursing  notes.  Pertinent labs & imaging results that were available during my care of the patient were reviewed by me and considered in my medical decision making (see chart for details).     Patient presents to ED for evaluation of URI symptoms for the past week including nasal congestion, dry cough, intermittent fevers.  Denies any chest pain, shortness of breath, wheezing, trouble swallowing, sore throat.  He is afebrile here with no use of antipyretics.  Lungs are clear to auscultation bilaterally.  I suspect viral URI as a cause of his symptoms.  Will provide symptomatic treatment with antitussives, anti-inflammatories and nasal spray. His next complaint is bilateral hand and finger discomfort.  He does have a history of arthritis of his right hand.  Cannot recall any inciting injury or event that triggered the pain.  On physical exam there is no erythema, warmth of hand or joints, changes in sensation, changes in pulses.  There is diffuse tenderness to palpation.  X-ray showed degenerative changes.  I have low suspicion for infectious or vascular cause of symptoms and suspect that this is a flareup of his arthritis, which she has a history of in his right hand.  I told  him that the anti-inflammatories will help with his pain as well.  Advised to follow-up with primary care provider for further evaluation.  Patient appears stable for discharge at this time. Strict return precautions given.  Portions of this note were generated with [employer]. Dictation errors may occur despite best attempts at proofreading.   Final Clinical Impressions(s) / ED Diagnoses   Final diagnoses:  Osteoarthritis of left hand, unspecified osteoarthritis type  Viral URI with cough    ED Discharge Orders        Ordered    naproxen (NAPROSYN) 500 MG tablet  2 times daily     10/05/17 1105    fluticasone (FLONASE) 50 MCG/ACT nasal spray  Daily     10/05/17 1105    benzonatate (TESSALON) 100 MG capsule  Every 8 hours     10/05/17 1105       Jorge Heady, PA-C 10/05/17 1110    Margette Fast, MD 10/06/17 [phone]

## 2017-10-05 NOTE — ED Triage Notes (Signed)
Patient complains of cold symptoms with cough x 1 week, cough worse at night. Also complains of bilateral hand/finger discomfort. Denies trauma

## 2017-10-05 NOTE — ED Notes (Signed)
Patient transported to X-ray 

## 2017-10-05 NOTE — ED Notes (Signed)
Provider assessed pt and discharged pt prior to nurse assessment.

## 2017-10-05 NOTE — Discharge Instructions (Signed)
Take naproxen as directed to help with hand pain and body aches. Use Flonase as needed for nasal congestion. Take Tessalon Perles as needed for cough. Return to ED for worsening symptoms, numbness in hands, injuries or falls, red hot or tender joints.

## 2017-10-12 ENCOUNTER — Encounter (HOSPITAL_COMMUNITY): Payer: Self-pay | Admitting: Emergency Medicine

## 2017-10-12 ENCOUNTER — Emergency Department (HOSPITAL_COMMUNITY)
Admission: EM | Admit: 2017-10-12 | Discharge: 2017-10-12 | Disposition: A | Discharge status: Home / Self Care | Payer: Managed Care, Other (non HMO) | Attending: Emergency Medicine | Admitting: Emergency Medicine

## 2017-10-12 ENCOUNTER — Other Ambulatory Visit: Payer: Self-pay

## 2017-10-12 DIAGNOSIS — R300 Dysuria: Secondary | ICD-10-CM | POA: Diagnosis present

## 2017-10-12 DIAGNOSIS — F17210 Nicotine dependence, cigarettes, uncomplicated: Secondary | ICD-10-CM | POA: Insufficient documentation

## 2017-10-12 LAB — URINALYSIS, ROUTINE W REFLEX MICROSCOPIC
BILIRUBIN URINE: NEGATIVE
Glucose, UA: NEGATIVE mg/dL
Hgb urine dipstick: NEGATIVE
Ketones, ur: NEGATIVE mg/dL
LEUKOCYTES UA: NEGATIVE
NITRITE: NEGATIVE
PH: 6 (ref 5.0–8.0)
Protein, ur: NEGATIVE mg/dL
SPECIFIC GRAVITY, URINE: 1.009 (ref 1.005–1.030)

## 2017-10-12 NOTE — ED Triage Notes (Signed)
Pt states he is having dysuria and frequency, pt c/o 8/10 abd pain at this time. No fever or chills.

## 2017-10-12 NOTE — ED Notes (Signed)
Patient states he could not urinate last evening and feels sore in lower abd from trying "so much". Patient stated he was able to urinate in ED and felt no pain or discomfort.

## 2017-10-12 NOTE — ED Provider Notes (Signed)
[facility] EMERGENCY DEPARTMENT Provider Note   CSN: [id number] Arrival date & time: 10/12/17  0554     History   Chief Complaint Chief Complaint  Patient presents with  . Dysuria    HPI Jorge Walker is a 52 y.o. male.  HPI  52 year old male with history of BPH, epididymal cyst presenting complaint of dysuria.patient report last night around midnight he tries urinating and after urinating he feels that it was incomplete. As he strain to urinate and he had a bowel movement. States that he was in the bathroom for approximately 2 hours both trying to urinate and having bowel movement. No report of constipation. He did report having some low abdominal pain from straining to urinate He decided to come to ER for further evaluation. On waiting the ED, his symptoms subsided and now he can urinate normally. He denies any associated fever, chills, lightheadedness, dizziness, back pain, hematuria, dysuria or rash. Denies any rectal pain or constipation. No penile pain or testicular pain. Denies any recent injury. No history of kidney stone. No other complaint. Denies any new sexual partner for concern for STI.  Past Medical History:  Diagnosis Date  . Allergy   . Arthritis    finger  . BPH (benign prostatic hypertrophy)   . Epididymal cyst   . Tobacco use disorder     Patient Active Problem List   Diagnosis Date Noted  . Encounter for health maintenance examination in adult 01/02/2016  . Special screening for malignant neoplasms, colon 01/02/2016  . Screening for prostate cancer 01/02/2016  . BPH (benign prostatic hypertrophy) 01/02/2016  . Nocturia 01/02/2016  . Hepatitis 01/02/2016  . Screen for STD (sexually transmitted disease) 01/02/2016  . Finger pain, right 01/02/2016  . Lipoma of extremity 01/02/2016  . Epididymal cyst 07/07/2008  . TOBACCO ABUSE 04/28/2008  . DENTAL CARIES 04/28/2008    Past Surgical History:  Procedure Laterality Date  . ARM  WOUND REPAIR / CLOSURE     right upper posterior arm - teenager, s/p trauma  . ARTERY REPAIR     right upper arm - teenager, s/p trauma to right upper arm       Home Medications    Prior to Admission medications   Medication Sig Start Date End Date Taking? Authorizing Provider  benzonatate (TESSALON) 100 MG capsule Take 1 capsule (100 mg total) by mouth every 8 (eight) hours. 10/05/17   Khatri, Hina, PA-C  cyclobenzaprine (FLEXERIL) 10 MG tablet Take 1 tablet (10 mg total) by mouth 2 (two) times daily as needed for muscle spasms. 08/06/17   McDonald, Mia A, PA-C  fluticasone (FLONASE) 50 MCG/ACT nasal spray Place 1 spray into both nostrils daily. 10/05/17   Khatri, Hina, PA-C  naproxen (NAPROSYN) 500 MG tablet Take 1 tablet (500 mg total) by mouth 2 (two) times daily. 10/05/17   Delia Heady, PA-C    Family History Family History  Problem Relation Age of Onset  . Hypertension Mother   . Pneumonia Father        died of pneumonia  . Cancer Neg Hx   . Heart disease Neg Hx   . Stroke Neg Hx   . Diabetes Neg Hx   . Colon cancer Neg Hx   . Colon polyps Neg Hx   . Esophageal cancer Neg Hx   . Rectal cancer Neg Hx   . Stomach cancer Neg Hx     Social History Social History   Tobacco Use  . Smoking  status: Current Every Day Smoker    Packs/day: 0.50    Years: 16.00    Pack years: 8.00    Types: Cigarettes  . Smokeless tobacco: Never Used  Substance Use Topics  . Alcohol use: Yes    Alcohol/week: 3.0 oz    Types: 5 Shots of liquor per week    Comment: weekends   . Drug use: No     Allergies   Patient has no known allergies.   Review of Systems Review of Systems  All other systems reviewed and are negative.    Physical Exam Updated Vital Signs BP (!) 153/95 (BP Location: Right Arm)   Pulse 63   Temp 97.6 F (36.4 C) (Oral)   Resp 18   SpO2 100%   Physical Exam  Constitutional: He appears well-developed and well-nourished. No distress.  HENT:  Head:  Atraumatic.  Eyes: Conjunctivae are normal.  Neck: Neck supple.  Abdominal: Soft. He exhibits no distension. There is no tenderness.  No CVA tenderness  Genitourinary:  Genitourinary Comments: Chaperone present during exam. No inguinal lymphadenopathy or inguinal hernia noted. Normal uncircumcised penis free of lesion or rash. No penile tenderness. Normal scrotum without pain on palpation. Normal rectum.  Neurological: He is alert.  Skin: No rash noted.  Psychiatric: He has a normal mood and affect.  Nursing note and vitals reviewed.    ED Treatments / Results  Labs (all labs ordered are listed, but only abnormal results are displayed) Labs Reviewed  URINALYSIS, ROUTINE W REFLEX MICROSCOPIC - Abnormal; Notable for the following components:      Result Value   Color, Urine STRAW (*)    All other components within normal limits    EKG  EKG Interpretation None       Radiology No results found.  Procedures Procedures (including critical care time)  Medications Ordered in ED Medications - No data to display   Initial Impression / Assessment and Plan / ED Course  I have reviewed the triage vital signs and the nursing notes.  Pertinent labs & imaging results that were available during my care of the patient were reviewed by me and considered in my medical decision making (see chart for details).    BP (!) 153/95 (BP Location: Right Arm)   Pulse 63   Temp 97.6 F (36.4 C) (Oral)   Resp 18   SpO2 100%    Final Clinical Impressions(s) / ED Diagnoses   Final diagnoses:  Dysuria    ED Discharge Orders    None     11:19 AM Patient presents with complaints of urinary discomfort and what sounds to be urinary retention initially. However that is resolved. He is able to urinate. Urine today shows no evidence of infection. No blood in the urine to suggest kidney stone. Examination unremarkable. At this time, patient stable for discharge, return precautions discussed.  Urology referral given as needed. Recommend follow-up with PCP for further care.   Domenic Moras, PA-C 10/12/17 1120    Cardama, Grayce Sessions, MD 10/12/17 2005

## 2017-11-28 ENCOUNTER — Encounter: Payer: Self-pay | Admitting: Medical

## 2017-11-28 ENCOUNTER — Ambulatory Visit (INDEPENDENT_AMBULATORY_CARE_PROVIDER_SITE_OTHER): Payer: Managed Care, Other (non HMO) | Admitting: Medical

## 2017-11-28 VITALS — BP 122/80 | HR 63 | Ht 68.0 in | Wt 175.0 lb

## 2017-11-28 DIAGNOSIS — D1779 Benign lipomatous neoplasm of other sites: Secondary | ICD-10-CM

## 2017-11-28 DIAGNOSIS — Z125 Encounter for screening for malignant neoplasm of prostate: Secondary | ICD-10-CM | POA: Diagnosis not present

## 2017-11-28 DIAGNOSIS — D1720 Benign lipomatous neoplasm of skin and subcutaneous tissue of unspecified limb: Secondary | ICD-10-CM

## 2017-11-28 DIAGNOSIS — F17200 Nicotine dependence, unspecified, uncomplicated: Secondary | ICD-10-CM

## 2017-11-28 DIAGNOSIS — R9431 Abnormal electrocardiogram [ECG] [EKG]: Secondary | ICD-10-CM | POA: Diagnosis not present

## 2017-11-28 DIAGNOSIS — N400 Enlarged prostate without lower urinary tract symptoms: Secondary | ICD-10-CM

## 2017-11-28 DIAGNOSIS — M2550 Pain in unspecified joint: Secondary | ICD-10-CM | POA: Diagnosis not present

## 2017-11-28 DIAGNOSIS — Z0000 Encounter for general adult medical examination without abnormal findings: Secondary | ICD-10-CM | POA: Diagnosis not present

## 2017-11-28 NOTE — Progress Notes (Signed)
Subjective:   HPI  Jorge Walker is a 52 y.o. male who presents for a complete physical.  curious about cysts of skin  Still smokes  Exercising some.    Gets pains in left knee, right elbow, has had recent care with elbow through workers comp, but they advised he see [place] since his symptoms continue.  Has had several interventions for right elbow through orthopedist.     Reviewed their medical, surgical, family, social, medication, and allergy history and updated chart as appropriate.  Past Medical History:  Diagnosis Date  . Allergy   . Arthritis    finger  . BPH (benign prostatic hypertrophy)   . Epididymal cyst   . Tobacco use disorder     Past Surgical History:  Procedure Laterality Date  . ARM WOUND REPAIR / CLOSURE     right upper posterior arm - teenager, s/p trauma  . ARTERY REPAIR     right upper arm - teenager, s/p trauma to right upper arm  . COLONOSCOPY  01/24/2016   Diverticulosis in left and right colon.   Repeat 10 years.  Dr. Scarlette Shorts    Social History   Socioeconomic History  . Marital status: Single    Spouse name: Not on file  . Number of children: Not on file  . Years of education: Not on file  . Highest education level: Not on file  Occupational History  . Not on file  Social Needs  . Financial resource strain: Not on file  . Food insecurity:    Worry: Not on file    Inability: Not on file  . Transportation needs:    Medical: Not on file    Non-medical: Not on file  Tobacco Use  . Smoking status: Current Every Day Smoker    Packs/day: 0.50    Years: 16.00    Pack years: 8.00    Types: Cigarettes  . Smokeless tobacco: Never Used  Substance and Sexual Activity  . Alcohol use: Yes    Alcohol/week: 3.0 oz    Types: 5 Shots of liquor per week    Comment: weekends   . Drug use: No  . Sexual activity: Not on file  Lifestyle  . Physical activity:    Days per week: Not on file    Minutes per session: Not on file  . Stress: Not  on file  Relationships  . Social connections:    Talks on phone: Not on file    Gets together: Not on file    Attends religious service: Not on file    Active member of club or organization: Not on file    Attends meetings of clubs or organizations: Not on file    Relationship status: Not on file  . Intimate partner violence:    Fear of current or ex partner: Not on file    Emotionally abused: Not on file    Physically abused: Not on file    Forced sexual activity: Not on file  Other Topics Concern  . Not on file  Social History Narrative   Single, girlfriend, divorced, has 1 child, 23yo daughter wants to go to college, she lives with mother, exercise with walking at work, works in Scientist, 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  11/2017    Family History  Problem Relation Age of Onset  . Hypertension Mother   . Pneumonia Father        died of pneumonia  . Cancer  Neg Hx   . Heart disease Neg Hx   . Stroke Neg Hx   . Diabetes Neg Hx   . Colon cancer Neg Hx   . Colon polyps Neg Hx   . Esophageal cancer Neg Hx   . Rectal cancer Neg Hx   . Stomach cancer Neg Hx      Current Outpatient Medications:  .  benzonatate (TESSALON) 100 MG capsule, Take 1 capsule (100 mg total) by mouth every 8 (eight) hours. (Patient not taking: Reported on 11/28/2017), Disp: 21 capsule, Rfl: 0 .  cyclobenzaprine (FLEXERIL) 10 MG tablet, Take 1 tablet (10 mg total) by mouth 2 (two) times daily as needed for muscle spasms. (Patient not taking: Reported on 11/28/2017), Disp: 20 tablet, Rfl: 0 .  fluticasone (FLONASE) 50 MCG/ACT nasal spray, Place 1 spray into both nostrils daily. (Patient not taking: Reported on 11/28/2017), Disp: 16 g, Rfl: 2 .  naproxen (NAPROSYN) 500 MG tablet, Take 1 tablet (500 mg total) by mouth 2 (two) times daily. (Patient not taking: Reported on 11/28/2017), Disp: 30 tablet, Rfl: 0  No Known Allergies   Review of Systems Constitutional: -fever, -chills, -sweats,  -unexpected weight change, -decreased appetite, -fatigue Allergy: -sneezing, -itching, -congestion Dermatology: -changing moles, --rash, +lumps ENT: -runny nose, -ear pain, -sore throat, -hoarseness, -sinus pain, -teeth pain, - ringing in ears, -hearing loss, -nosebleeds Cardiology: -chest pain, -palpitations, -swelling, -difficulty breathing when lying flat, -waking up short of breath Respiratory: -cough, -shortness of breath, -difficulty breathing with exercise or exertion, -wheezing, -coughing up blood Gastroenterology: -abdominal pain, -nausea, -vomiting, -diarrhea, -constipation, -blood in stool, -changes in bowel movement, -difficulty swallowing or eating Hematology: -bleeding, -bruising  Musculoskeletal: +joint aches, -muscle aches, -joint swelling, -back pain, -neck pain, -cramping, -changes in gait Ophthalmology: denies vision changes, eye redness, itching, discharge Urology: -burning with urination, -difficulty urinating, -blood in urine, +urinary frequency, -urgency, -incontinence Neurology: -headache, -weakness, -tingling, -numbness, -memory loss, -falls, -dizziness Psychology: -depressed mood, -agitation, -sleep problems     Objective:   Physical Exam  BP 122/80 (BP Location: Right Arm, Patient Position: Sitting, Cuff Size: Normal)   Pulse 63   Ht 5\' 8"  (1.727 m)   Wt 175 lb (79.4 kg)   SpO2 98%   BMI 26.61 kg/m    General appearance: alert, no distress, WD/WN, lean AA male Skin:right triceps region with prior trauma scar, no other worrisome lesions HEENT: normocephalic, conjunctiva/corneas normal, sclerae anicteric, PERRLA, EOMi, nares patent, no discharge or erythema, pharynx normal Oral cavity: MMM, tongue normal, teeth with upper dentures, otherwise in good repair Neck: supple, no lymphadenopathy, no thyromegaly, no masses, normal ROM, no bruits Chest: non tender, normal shape and expansion Heart: RRR, normal S1, S2, no murmurs Lungs: CTA bilaterally, no wheezes,  rhonchi, or rales Abdomen: +bs, soft, non tender, non distended, no masses, no hepatomegaly, no splenomegaly, no bruits Back: non tender, normal ROM, no scoliosis Musculoskeletal: left buttock midline with 3-4cm deep round palpable mass, lipoma, nontender, left upper anterior thigh with 4cm not well defined but fatty tissue lump most likely lipoma, otherwise upper extremities non tender, no obvious deformity, normal ROM throughout, lower extremities non tender, no obvious deformity, normal ROM throughout Extremities: no edema, no cyanosis, no clubbing Pulses: 2+ symmetric, upper and lower extremities, normal cap refill Neurological: alert, oriented x 3, CN2-12 intact, strength normal upper extremities and lower extremities, sensation normal throughout, DTRs 2+ throughout, no cerebellar signs, gait normal Psychiatric: normal affect, behavior normal, pleasant  GU: normal male external genitalia, multiple nodules  round and hard bilat superior to testes suggestive of epididymal cysts as noted on prior 2009 ultrasound, but seem larger, somewhat tender, no hernia, no lymphadenopathy Rectal: declined   Adult ECG Report  Indication: physical  Rate: 58 bpm  Rhythm: sinus bradycardia  QRS Axis: 63 degrees  PR Interval: 143ms  QRS Duration: 62ms  QTc: 398ms  Conduction Disturbances: possible LVH, unchanged from prior EKG  Other Abnormalities: none  Patient's cardiac risk factors are: male gender and smoking/ tobacco exposure.  EKG comparison: 2014 EKG  Narrative Interpretation: LVH, unchanged from prior EKG    Assessment and Plan :   Encounter Diagnoses  Name Primary?  . Encounter for health maintenance examination in adult Yes  . TOBACCO ABUSE   . Benign prostatic hyperplasia without lower urinary tract symptoms   . Screening for prostate cancer   . Lipoma of extremity   . Polyarthralgia   . Abnormal EKG    Discussed preventative care, routine f/u, yearly eye doctor and dentis f/u,  exercise, healthy diet, vaccines.  Reviewed his up to date colonoscopy PSA prostate screen Routine labs today See your eye doctor yearly for routine vision care. See your dentist yearly for routine dental care including hygiene visits twice yearly. EKG shows LVH but he has normal BP.  Given tobacco history, EKG, will send for CXR. Advised cessation of tobacco F/u pending labs  Armani was seen today for annual exam.  Diagnoses and all orders for this visit:  Encounter for health maintenance examination in adult -     Comprehensive metabolic panel -     Lipid panel -     CBC -     PSA -     EKG 12-Lead  TOBACCO ABUSE -     DG Chest 2 View; Future  Benign prostatic hyperplasia without lower urinary tract symptoms -     PSA  Screening for prostate cancer -     PSA  Lipoma of extremity  Polyarthralgia -     Sedimentation rate -     Uric acid  Abnormal EKG -     DG Chest 2 View; Future

## 2017-11-28 NOTE — Patient Instructions (Signed)
Thanks for trusting [place] with your health care and for coming in for a physical today.  Below are some general recommendations I have for you:  Yearly screenings See your eye doctor yearly for routine vision care. See your dentist yearly for routine dental care including hygiene visits twice yearly. See me here yearly for a routine physical and preventative care visit   Specific Concerns today:  . Shingles vaccine:  I recommend you have a shingles vaccine to help prevent shingles or herpes zoster outbreak.   Please call your insurer to inquire about coverage for the Shingrix vaccine given in 2 doses.   Some insurers cover this vaccine after age 69, some cover this after age 44.  If your insurer covers this, then call to schedule appointment to have this vaccine here. Marland Kitchen STOP SMOKING! . Lipomas - no worries, ignore them   Please follow up yearly for a physical.   Lipoma A lipoma is a noncancerous (benign) tumor that is made up of fat cells. This is a very common type of soft-tissue growth. Lipomas are usually found under the skin (subcutaneous). They may occur in any tissue of the body that contains fat. Common areas for lipomas to appear include the back, shoulders, buttocks, and thighs. Lipomas grow slowly, and they are usually painless. Most lipomas do not cause problems and do not require treatment. What are the causes? The cause of this condition is not known. What increases the risk? This condition is more likely to develop in:  People who are 9-54 years old.  People who have a family history of lipomas.  What are the signs or symptoms? A lipoma usually appears as a small, round bump under the skin. It may feel soft or rubbery, but the firmness can vary. Most lipomas are not painful. However, a lipoma may become painful if it is located in an area where it pushes on nerves. How is this diagnosed? A lipoma can usually be diagnosed with a physical exam. You may also have tests to  confirm the diagnosis and to rule out other conditions. Tests may include:  Imaging tests, such as a CT scan or MRI.  Removal of a tissue sample to be looked at under a microscope (biopsy).  How is this treated? Treatment is not needed for small lipomas that are not causing problems. If a lipoma continues to get bigger or it causes problems, removal is often the best option. Lipomas can also be removed to improve appearance. Removal of a lipoma is usually done with a surgery in which the fatty cells and the surrounding capsule are removed. Most often, a medicine that numbs the area (local anesthetic) is used for this procedure. Follow these instructions at home:  Keep all follow-up visits as directed by your health care provider. This is important. Contact a health care provider if:  Your lipoma becomes larger or hard.  Your lipoma becomes painful, red, or increasingly swollen. These could be signs of infection or a more serious condition. This information is not intended to replace advice given to you by your health care provider. Make sure you discuss any questions you have with your health care provider. Document Released: 08/02/2002 Document Revised: 01/18/2016 Document Reviewed: 08/08/2014 Elsevier Interactive Patient Education  [employer].   Please try and quit smoking--start thinking about why/when you smoke (habit, boredom, stress) in order to come up with effective strategies to cut back or quit. Available resources to help you quit include free counseling through   Quitline ([place]Quitline.com or 1-800-QUITNOW), smoking cessation classes through [facility] (call to find out schedule), over-the-counter nicotine replacements, and e-cigarettes (although this may not help break the hand-mouth habit).  Many insurance companies also have smoking cessation programs (which may decrease the cost of patches, meds if enrolled).  If these methods are not effective for  you, and you are motivated to quit, return to discuss the possibility of prescription medications.   If you wish to quit smoking, help is available.  For free tobacco cessation program offerings call the [facility] at 778-404-8836 or Live Well Line at 606-530-4847. You may also visit [email or web address] or email [email or web address]  for more information on other programs.    Smoking Tobacco Information Smoking tobacco will very likely harm your health. Tobacco contains a poisonous (toxic), colorless chemical called nicotine. Nicotine affects the brain and makes tobacco addictive. This change in your brain can make it hard to stop smoking. Tobacco also has other toxic chemicals that can hurt your body and raise your risk of many cancers. How can smoking tobacco affect me? Smoking tobacco can increase your chances of having serious health conditions, such as:  Cancer. Smoking is most commonly associated with lung cancer, but can lead to cancer in other parts of the body.  Chronic obstructive pulmonary disease (COPD). This is a long-term lung condition that makes it hard to breathe. It also gets worse over time.  High blood pressure (hypertension), heart disease, stroke, or heart attack.  Lung infections, such as pneumonia.  Cataracts. This is when the lenses in the eyes become clouded.  Digestive problems. This may include peptic ulcers, heartburn, and gastroesophageal reflux disease (GERD).  Oral health problems, such as gum disease and tooth loss.  Loss of taste and smell.  Smoking can affect your appearance by causing:  Wrinkles.  Yellow or stained teeth, fingers, and fingernails.  Smoking tobacco can also affect your social life.  Many workplaces, [employer], hotels, and public places are tobacco-free. This means that you may experience challenges in finding places to smoke when away from home.  The cost of a smoking habit can be expensive. Expenses  for someone who smokes come in two ways: ? You spend money on a regular basis to buy tobacco. ? Your health care costs in the long-term are higher if you smoke.  Tobacco smoke can also affect the health of those around you. Children of smokers have greater chances of: ? Sudden infant death syndrome (SIDS). ? Ear infections. ? Lung infections.  What lifestyle changes can be made?  Do not start smoking. Quit if you already do.  To quit smoking: ? Make a plan to quit smoking and commit yourself to it. Look for programs to help you and ask your health care provider for recommendations and ideas. ? Talk with your health care provider about using nicotine replacement medicines to help you quit. Medicine replacement medicines include gum, lozenges, patches, sprays, or pills. ? Do not replace cigarette smoking with electronic cigarettes, which are commonly called e-cigarettes. The safety of e-cigarettes is not known, and some may contain harmful chemicals. ? Avoid places, people, or situations that tempt you to smoke. ? If you try to quit but return to smoking, don't give up hope. It is very common for people to try a number of times before they fully succeed. When you feel ready again, give it another try.  Quitting smoking might affect the way you eat as  well as your weight. Be prepared to monitor your eating habits. Get support in planning and following a healthy diet.  Ask your health care provider about having regular tests (screenings) to check for cancer. This may include blood tests, imaging tests, and other tests.  Exercise regularly. Consider taking walks, joining a gym, or doing yoga or exercise classes.  Develop skills to manage your stress. These skills include meditation. What are the benefits of quitting smoking? By quitting smoking, you may:  Lower your risk of getting cancer and other diseases caused by smoking.  Live longer.  Breathe better.  Lower your blood pressure  and heart rate.  Stop your addiction to tobacco.  Stop creating secondhand smoke that hurts other people.  Improve your sense of taste and smell.  Look better over time, due to having fewer wrinkles and less staining.  What can happen if changes are not made? If you do not stop smoking, you may:  Get cancer and other diseases.  Develop COPD or other long-term (chronic) lung conditions.  Develop serious problems with your heart and blood vessels (cardiovascular system).  Need more tests to screen for problems caused by smoking.  Have higher, long-term healthcare costs from medicines or treatments related to smoking.  Continue to have worsening changes in your lungs, mouth, and nose.  Where to find support: To get support to quit smoking, consider:  Asking your health care provider for more information and resources.  Taking classes to learn more about quitting smoking.  Looking for local organizations that offer resources about quitting smoking.  Joining a support group for people who want to quit smoking in your local community.  Where to find more information: You may find more information about quitting smoking from:  HelpGuide.org: www.helpguide.org/articles/addictions/how-to-quit-smoking.htm  https://hall.com/: smokefree.gov  American Lung Association: www.lung.org  Contact a health care provider if:  You have problems breathing.  Your lips, nose, or fingers turn blue.  You have chest pain.  You are coughing up blood.  You feel faint or you pass out.  You have other noticeable changes that cause you to worry. Summary  Smoking tobacco can negatively affect your health, the health of those around you, your finances, and your social life.  Do not start smoking. Quit if you already do. If you need help quitting, ask your health care provider.  Think about joining a support group for people who want to quit smoking in your local community. There are many  effective programs that will help you to quit this behavior. This information is not intended to replace advice given to you by your health care provider. Make sure you discuss any questions you have with your health care provider. Document Released: 08/27/2016 Document Revised: 08/27/2016 Document Reviewed: 08/27/2016 Elsevier Interactive Patient Education  2018 [place] for Adults - Male      [place]:  A routine yearly physical is a good way to check in with your primary care provider about your health and preventive screening. It is also an opportunity to share updates about your health and any concerns you have, and receive a thorough all-over exam.   Most health insurance companies pay for at least some preventative services.  Check with your health plan for specific coverages.  WHAT PREVENTATIVE SERVICES DO WOMEN NEED?  Adult men should have their weight and blood pressure checked regularly.   Men age 73 and older should have their cholesterol levels checked regularly.  Beginning at age 52 and continuing to age 78, men should be screened for colorectal cancer.  Certain people may need continued testing until age 110.  Updating vaccinations is part of preventative care.  Vaccinations help protect against diseases such as the flu.  Osteoporosis is a disease in which the bones lose minerals and strength as we age. Men ages 67 and over should discuss this with their caregivers  Lab tests are generally done as part of preventative care to screen for anemia and blood disorders, to screen for problems with the kidneys and liver, to screen for bladder problems, to check blood sugar, and to check your cholesterol level.  Preventative services generally include counseling about diet, exercise, avoiding tobacco, drugs, excessive alcohol consumption, and sexually transmitted infections.    GENERAL RECOMMENDATIONS FOR GOOD HEALTH:  Healthy  diet:  Eat a variety of foods, including fruit, vegetables, animal or vegetable protein, such as meat, fish, chicken, and eggs, or beans, lentils, tofu, and grains, such as rice.  Drink plenty of water daily.  Decrease saturated fat in the diet, avoid lots of red meat, processed foods, sweets, fast foods, and fried foods.  Exercise:  Aerobic exercise helps maintain good heart health. At least 30-40 minutes of moderate-intensity exercise is recommended. For example, a brisk walk that increases your heart rate and breathing. This should be done on most days of the week.   Find a type of exercise or a variety of exercises that you enjoy so that it becomes a part of your daily life.  Examples are running, walking, swimming, water aerobics, and biking.  For motivation and support, explore group exercise such as aerobic class, spin class, Zumba, Yoga,or  martial arts, etc.    Set exercise goals for yourself, such as a certain weight goal, walk or run in a race such as a 5k walk/run.  Speak to your primary care provider about exercise goals.  Disease prevention:  If you smoke or chew tobacco, find out from your caregiver how to quit. It can literally save your life, no matter how long you have been a tobacco user. If you do not use tobacco, never begin.   Maintain a healthy diet and normal weight. Increased weight leads to problems with blood pressure and diabetes.   The Body Mass Index or BMI is a way of measuring how much of your body is fat. Having a BMI above 27 increases the risk of heart disease, diabetes, hypertension, stroke and other problems related to obesity. Your caregiver can help determine your BMI and based on it develop an exercise and dietary program to help you achieve or maintain this important measurement at a healthful level.  High blood pressure causes heart and blood vessel problems.  Persistent high blood pressure should be treated with medicine if weight loss and exercise  do not work.   Fat and cholesterol leaves deposits in your arteries that can block them. This causes heart disease and vessel disease elsewhere in your body.  If your cholesterol is found to be high, or if you have heart disease or certain other medical conditions, then you may need to have your cholesterol monitored frequently and be treated with medication.   Ask if you should have a cardiac stress test if your history suggests this. A stress test is a test done on a treadmill that looks for heart disease. This test can find disease prior to there being a problem.  Osteoporosis is a disease in which the  bones lose minerals and strength as we age. This can result in serious bone fractures. Risk of osteoporosis can be identified using a bone density scan. Men ages 71 and over should discuss this with their caregivers. Ask your caregiver whether you should be taking a calcium supplement and Vitamin D, to reduce the rate of osteoporosis.   Avoid drinking alcohol in excess (more than two drinks per day).  Avoid use of street drugs. Do not share needles with anyone. Ask for professional help if you need assistance or instructions on stopping the use of alcohol, cigarettes, and/or drugs.  Brush your teeth twice a day with fluoride toothpaste, and floss once a day. Good oral hygiene prevents tooth decay and gum disease. The problems can be painful, unattractive, and can cause other health problems. Visit your dentist for a routine oral and dental check up and preventive care every 6-12 months.   Look at your skin regularly.  Use a mirror to look at your back. Notify your caregivers of changes in moles, especially if there are changes in shapes, colors, a size larger than a pencil eraser, an irregular border, or development of new moles.  Safety:  Use seatbelts 100% of the time, whether driving or as a passenger.  Use safety devices such as hearing protection if you work in environments with loud noise or  significant background noise.  Use safety glasses when doing any work that could send debris in to the eyes.  Use a helmet if you ride a bike or motorcycle.  Use appropriate safety gear for contact sports.  Talk to your caregiver about gun safety.  Use sunscreen with a SPF (or skin protection factor) of 15 or greater.  Lighter skinned people are at a greater risk of skin cancer. Don't forget to also wear sunglasses in order to protect your eyes from too much damaging sunlight. Damaging sunlight can accelerate cataract formation.   Practice safe sex. Use condoms. Condoms are used for birth control and to help reduce the spread of sexually transmitted infections (or STIs).  Some of the STIs are gonorrhea (the clap), chlamydia, syphilis, trichomonas, herpes, HPV (human papilloma virus) and HIV (human immunodeficiency virus) which causes AIDS. The herpes, HIV and HPV are viral illnesses that have no cure. These can result in disability, cancer and death.   Keep carbon monoxide and smoke detectors in your home functioning at all times. Change the batteries every 6 months or use a model that plugs into the wall.   Vaccinations:  Stay up to date with your tetanus shots and other required immunizations. You should have a booster for tetanus every 10 years. Be sure to get your flu shot every year, since 5%-20% of the [place] population comes down with the flu. The flu vaccine changes each year, so being vaccinated once is not enough. Get your shot in the fall, before the flu season peaks.   Other vaccines to consider:  Human Papilloma Virus or HPV causes cancer of the cervix, and other infections that can be transmitted from person to person. There is a vaccine for HPV, and males should get immunized between the ages of 56 and 70. It requires a series of 3 shots.   Pneumococcal vaccine to protect against certain types of pneumonia.  This is normally recommended for adults age 65 or older.  However, adults younger  than 53 years old with certain underlying conditions such as diabetes, heart or lung disease should also receive the vaccine.  Shingles  vaccine to protect against Varicella Zoster if you are older than age 61, or younger than 53 years old with certain underlying illness.  If you have not had the Shingrix vaccine, please call your insurer to inquire about coverage for the Shingrix vaccine given in 2 doses.   Some insurers cover this vaccine after age 9, some cover this after age 81.  If your insurer covers this, then call to schedule appointment to have this vaccine here  Hepatitis A vaccine to protect against a form of infection of the liver by a virus acquired from food.  Hepatitis B vaccine to protect against a form of infection of the liver by a virus acquired from blood or body fluids, particularly if you work in health care.  If you plan to travel internationally, check with your local health department for specific vaccination recommendations.   What should I know about cancer screening? Many types of cancers can be detected early and may often be prevented. Lung Cancer  You should be screened every year for lung cancer if: ? You are a current smoker who has smoked for at least 30 years. ? You are a former smoker who has quit within the past 15 years.  Talk to your health care provider about your screening options, when you should start screening, and how often you should be screened.  Colorectal Cancer  Routine colorectal cancer screening usually begins at 53 years of age and should be repeated every 5-10 years until you are 53 years old. You may need to be screened more often if early forms of precancerous polyps or small growths are found. Your health care provider may recommend screening at an earlier age if you have risk factors for colon cancer.  Your health care provider may recommend using home test kits to check for hidden blood in the stool.  A small camera at the end of a  tube can be used to examine your colon (sigmoidoscopy or colonoscopy). This checks for the earliest forms of colorectal cancer.  Prostate and Testicular Cancer  Depending on your age and overall health, your health care provider may do certain tests to screen for prostate and testicular cancer.  Talk to your health care provider about any symptoms or concerns you have about testicular or prostate cancer.  Skin Cancer  Check your skin from head to toe regularly.  Tell your health care provider about any new moles or changes in moles, especially if: ? There is a change in a mole's size, shape, or color. ? You have a mole that is larger than a pencil eraser.  Always use sunscreen. Apply sunscreen liberally and repeat throughout the day.  Protect yourself by wearing long sleeves, pants, a wide-brimmed hat, and sunglasses when outside.

## 2017-11-29 LAB — COMPREHENSIVE METABOLIC PANEL
ALT: 14 IU/L (ref 0–44)
AST: 21 IU/L (ref 0–40)
Albumin/Globulin Ratio: 1.7 (ref 1.2–2.2)
Albumin: 4.4 g/dL (ref 3.5–5.5)
Alkaline Phosphatase: 59 IU/L (ref 39–117)
BUN/Creatinine Ratio: 13 (ref 9–20)
BUN: 16 mg/dL (ref 6–24)
Bilirubin Total: 0.3 mg/dL (ref 0.0–1.2)
CALCIUM: 10 mg/dL (ref 8.7–10.2)
CHLORIDE: 104 mmol/L (ref 96–106)
CO2: 25 mmol/L (ref 20–29)
Creatinine, Ser: 1.27 mg/dL (ref 0.76–1.27)
GFR, EST AFRICAN AMERICAN: 75 mL/min/{1.73_m2} (ref >59)
GFR, EST NON AFRICAN AMERICAN: 65 mL/min/{1.73_m2} (ref >59)
GLUCOSE: 85 mg/dL (ref 65–99)
Globulin, Total: 2.6 g/dL (ref 1.5–4.5)
Potassium: 4.9 mmol/L (ref 3.5–5.2)
Sodium: 141 mmol/L (ref 134–144)
TOTAL PROTEIN: 7 g/dL (ref 6.0–8.5)

## 2017-11-29 LAB — CBC
HEMATOCRIT: 39.9 % (ref 37.5–51.0)
HEMOGLOBIN: 13 g/dL (ref 13.0–17.7)
MCH: 28 pg (ref 26.6–33.0)
MCHC: 32.6 g/dL (ref 31.5–35.7)
MCV: 86 fL (ref 79–97)
Platelets: 197 10*3/uL (ref 150–379)
RBC: 4.64 x10E6/uL (ref 4.14–5.80)
RDW: 14.2 % (ref 12.3–15.4)
WBC: 5.6 10*3/uL (ref 3.4–10.8)

## 2017-11-29 LAB — LIPID PANEL
CHOL/HDL RATIO: 3.4 ratio (ref 0.0–5.0)
Cholesterol, Total: 151 mg/dL (ref 100–199)
HDL: 45 mg/dL (ref >39)
LDL Calculated: 83 mg/dL (ref 0–99)
TRIGLYCERIDES: 113 mg/dL (ref 0–149)
VLDL CHOLESTEROL CAL: 23 mg/dL (ref 5–40)

## 2017-11-29 LAB — URIC ACID: URIC ACID: 5.9 mg/dL (ref 3.7–8.6)

## 2017-11-29 LAB — SEDIMENTATION RATE: Sed Rate: 9 mm/hr (ref 0–30)

## 2017-11-29 LAB — PSA: Prostate Specific Ag, Serum: 3.7 ng/mL (ref 0.0–4.0)

## 2017-12-01 ENCOUNTER — Telehealth: Payer: Self-pay

## 2017-12-01 ENCOUNTER — Ambulatory Visit
Admission: RE | Admit: 2017-12-01 | Discharge: 2017-12-01 | Disposition: A | Discharge status: Home / Self Care | Payer: Managed Care, Other (non HMO) | Source: Ambulatory Visit | Attending: Medical | Admitting: Medical

## 2017-12-01 DIAGNOSIS — R9431 Abnormal electrocardiogram [ECG] [EKG]: Secondary | ICD-10-CM

## 2017-12-01 DIAGNOSIS — F17200 Nicotine dependence, unspecified, uncomplicated: Secondary | ICD-10-CM

## 2017-12-01 IMAGING — CR DG CHEST 2V
2 series · 2 of 2 positions shown · non-contrast
Comparison: None.

CLINICAL DATA: Chest pain and shortness of breath.  Abnormal EKG.

EXAM:
CHEST - 2 VIEW

[w chest pa]
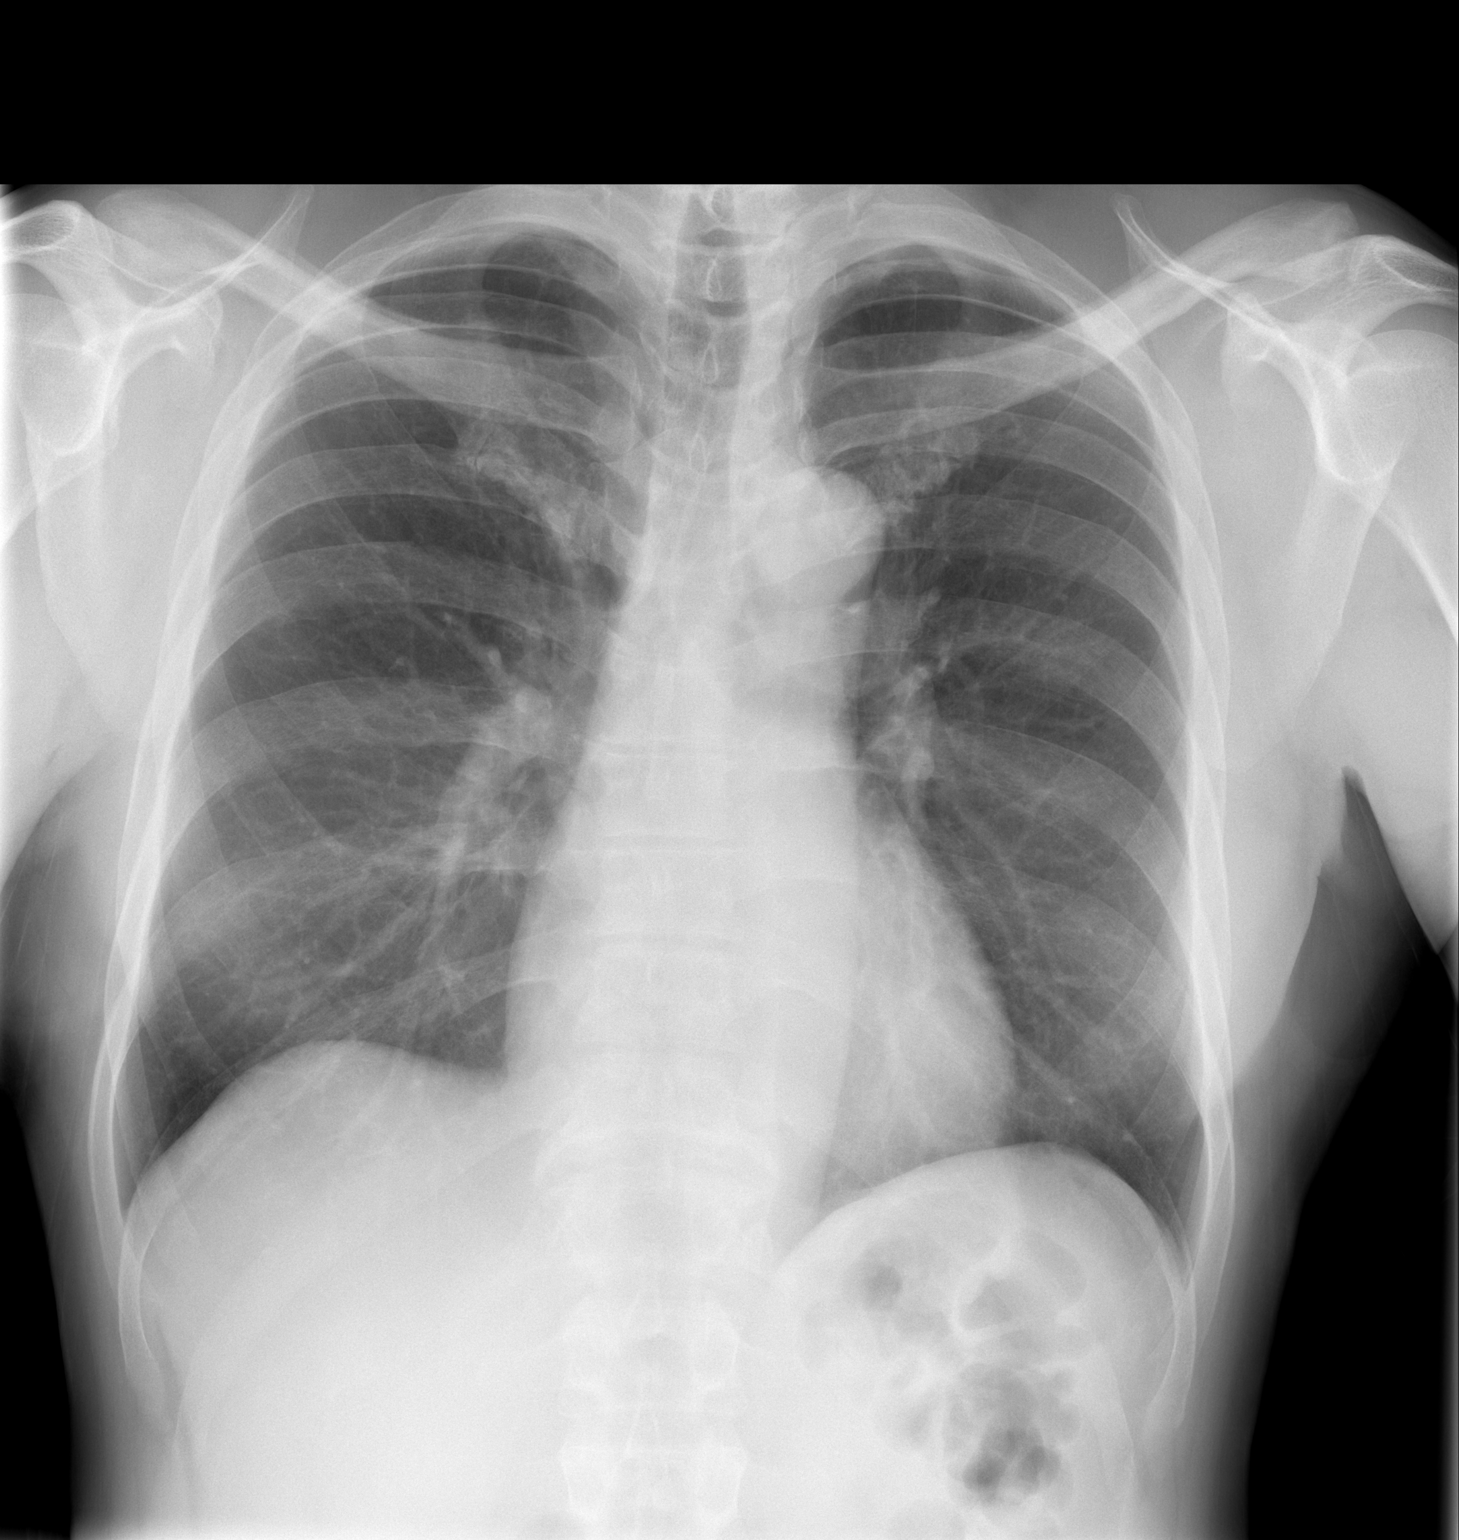

[w chest lat]
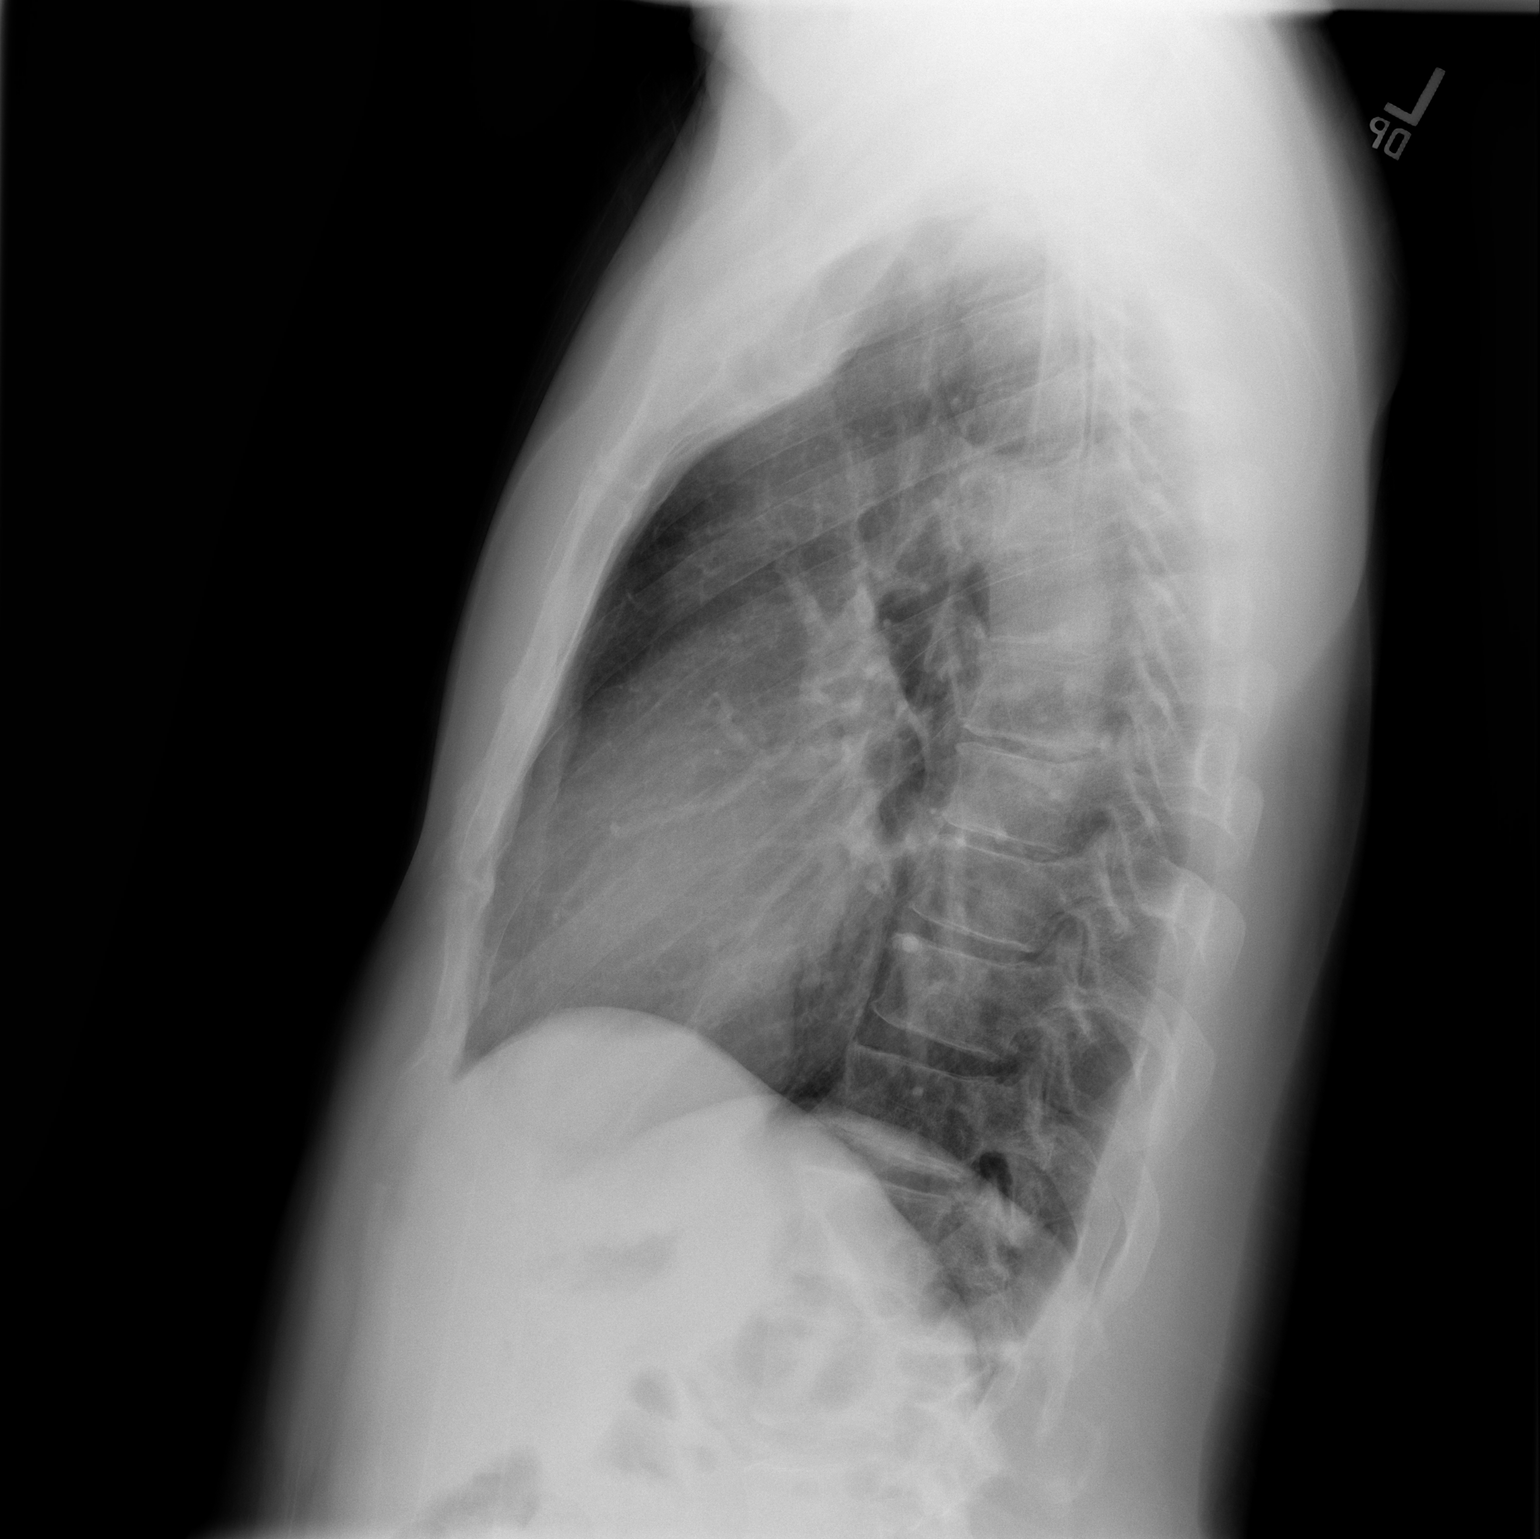

[2 of 2 positions shown; findings below may reference images not displayed]

FINDINGS: The heart size and mediastinal contours are within normal limits.
Both lungs are clear. The visualized skeletal structures are
unremarkable.
IMPRESSION: No active cardiopulmonary disease.

## 2017-12-01 NOTE — Telephone Encounter (Signed)
-----   Message from Carlena Hurl, PA-C sent at 12/01/2017  7:47 AM EDT ----- Labs were all good.   Have him go for chest xray as discussed.

## 2017-12-01 NOTE — Telephone Encounter (Signed)
LVM, gave lab results. Gave call back number if any questions or concerns.  

## 2017-12-02 ENCOUNTER — Telehealth: Payer: Self-pay

## 2017-12-02 NOTE — Telephone Encounter (Signed)
-----   Message from Carlena Hurl, PA-C sent at 12/01/2017  7:45 PM EDT ----- Kermit Balo news, the xray shows no worrisome findings!  Exercise regularly, eat a healthy low fat diet, try to quit tobacco!  We can't help with tobacco cessation if he is ready.  One step is to call 1-800-QUIT-NOW hotline for some free tips.  F/u yearly for physical in general

## 2017-12-02 NOTE — Telephone Encounter (Signed)
Called patient and gave lab results. Patient had no questions or concerns.  

## 2018-08-26 DIAGNOSIS — N400 Enlarged prostate without lower urinary tract symptoms: Secondary | ICD-10-CM

## 2018-08-26 HISTORY — DX: Benign prostatic hyperplasia without lower urinary tract symptoms: N40.0

## 2018-10-04 ENCOUNTER — Encounter (HOSPITAL_COMMUNITY): Payer: Self-pay | Admitting: Emergency Medicine

## 2018-10-04 ENCOUNTER — Emergency Department (HOSPITAL_COMMUNITY)
Admission: EM | Admit: 2018-10-04 | Discharge: 2018-10-04 | Disposition: A | Discharge status: Home / Self Care | Payer: Managed Care, Other (non HMO) | Attending: Emergency Medicine | Admitting: Emergency Medicine

## 2018-10-04 DIAGNOSIS — F17210 Nicotine dependence, cigarettes, uncomplicated: Secondary | ICD-10-CM | POA: Insufficient documentation

## 2018-10-04 DIAGNOSIS — Y998 Other external cause status: Secondary | ICD-10-CM | POA: Insufficient documentation

## 2018-10-04 DIAGNOSIS — Y9389 Activity, other specified: Secondary | ICD-10-CM | POA: Diagnosis not present

## 2018-10-04 DIAGNOSIS — W228XXA Striking against or struck by other objects, initial encounter: Secondary | ICD-10-CM | POA: Diagnosis not present

## 2018-10-04 DIAGNOSIS — Y9289 Other specified places as the place of occurrence of the external cause: Secondary | ICD-10-CM | POA: Diagnosis not present

## 2018-10-04 DIAGNOSIS — S0502XA Injury of conjunctiva and corneal abrasion without foreign body, left eye, initial encounter: Secondary | ICD-10-CM | POA: Diagnosis not present

## 2018-10-04 DIAGNOSIS — S0592XA Unspecified injury of left eye and orbit, initial encounter: Secondary | ICD-10-CM | POA: Diagnosis present

## 2018-10-04 MED ORDER — POLYMYXIN B-TRIMETHOPRIM 10000-0.1 UNIT/ML-% OP SOLN: 1.0000 [drp] | OPHTHALMIC | 0 refills | Status: AC

## 2018-10-04 MED ORDER — TETRACAINE HCL 0.5 % OP SOLN
2.0000 [drp] | Freq: Once | OPHTHALMIC | Status: AC
  Administered 2018-10-04: 2 [drp] via OPHTHALMIC
  Filled 2018-10-04: qty 4

## 2018-10-04 MED ORDER — FLUORESCEIN SODIUM 1 MG OP STRP
1.0000 | ORAL_STRIP | Freq: Once | OPHTHALMIC | Status: AC
  Administered 2018-10-04: 1 via OPHTHALMIC
  Filled 2018-10-04: qty 1

## 2018-10-04 NOTE — Discharge Instructions (Signed)
Use antibiotic eyedrops as prescribed. Use a cool compress to help with irritation and swelling. Do not rub, itch, or irritate your eye, this will make things worse. Follow-up with the eye doctor listed below if your symptoms not improving in the next 1 to 2 days. Return to the emergency room if you develop severe worsening pain, inability to move your eye, drainage from the eye, fevers, or any new, worsening, concerning symptoms.

## 2018-10-04 NOTE — ED Triage Notes (Signed)
Pt states he was trying on glasses and the cardboard tag cut his left eye. Swelling to eye lid, pt applying pressure and rubbing his left eye.

## 2018-10-04 NOTE — ED Provider Notes (Signed)
[facility] EMERGENCY DEPARTMENT Provider Note   CSN: [id number] Arrival date & time: 10/04/18  7858     History   Chief Complaint Chief Complaint  Patient presents with  . Eye Injury    HPI Jorge Walker is a 53 y.o. male presenting for evaluation of left eye injury.  Patient states last night he was trying on glasses when the cardboard tag poked him in the left eye.  He reports acute onset pain.  He used lubricating eyedrops, expecting it to get better, but today pain persisted.  He states it feels like there is something in his eye.  He denies vision changes.  He does not wear contacts.  Eye has been watery since.  He has not tried anything else.  He has no medical problems, takes medications daily.  He has no history of eye problems.  He denies consensual photophobia.  He denies difficulty moving his eyes.    HPI  Past Medical History:  Diagnosis Date  . Allergy   . Arthritis    finger  . BPH (benign prostatic hypertrophy)   . Epididymal cyst   . Tobacco use disorder     Patient Active Problem List   Diagnosis Date Noted  . Abnormal EKG 11/28/2017  . Polyarthralgia 11/28/2017  . Encounter for health maintenance examination in adult 01/02/2016  . Special screening for malignant neoplasms, colon 01/02/2016  . Screening for prostate cancer 01/02/2016  . Benign prostatic hyperplasia 01/02/2016  . Nocturia 01/02/2016  . Lipoma of extremity 01/02/2016  . Epididymal cyst 07/07/2008  . TOBACCO ABUSE 04/28/2008    Past Surgical History:  Procedure Laterality Date  . ARM WOUND REPAIR / CLOSURE     right upper posterior arm - teenager, s/p trauma  . ARTERY REPAIR     right upper arm - teenager, s/p trauma to right upper arm  . COLONOSCOPY  01/24/2016   Diverticulosis in left and right colon.   Repeat 10 years.  Dr. Scarlette Shorts        Home Medications    Prior to Admission medications   Medication Sig Start Date End Date Taking?  Authorizing Provider  benzonatate (TESSALON) 100 MG capsule Take 1 capsule (100 mg total) by mouth every 8 (eight) hours. Patient not taking: Reported on 11/28/2017 10/05/17   Delia Heady, PA-C  cyclobenzaprine (FLEXERIL) 10 MG tablet Take 1 tablet (10 mg total) by mouth 2 (two) times daily as needed for muscle spasms. Patient not taking: Reported on 11/28/2017 08/06/17   McDonald, Mia A, PA-C  fluticasone (FLONASE) 50 MCG/ACT nasal spray Place 1 spray into both nostrils daily. Patient not taking: Reported on 11/28/2017 10/05/17   Delia Heady, PA-C  naproxen (NAPROSYN) 500 MG tablet Take 1 tablet (500 mg total) by mouth 2 (two) times daily. Patient not taking: Reported on 11/28/2017 10/05/17   Delia Heady, PA-C  trimethoprim-polymyxin b (POLYTRIM) ophthalmic solution Place 1 drop into the left eye every 4 (four) hours for 5 days. 10/04/18 10/09/18  Jonathan Corpus, PA-C    Family History Family History  Problem Relation Age of Onset  . Hypertension Mother   . Pneumonia Father        died of pneumonia  . Cancer Neg Hx   . Heart disease Neg Hx   . Stroke Neg Hx   . Diabetes Neg Hx   . Colon cancer Neg Hx   . Colon polyps Neg Hx   . Esophageal cancer Neg Hx   .  Rectal cancer Neg Hx   . Stomach cancer Neg Hx     Social History Social History   Tobacco Use  . Smoking status: Current Every Day Smoker    Packs/day: 0.50    Years: 16.00    Pack years: 8.00    Types: Cigarettes  . Smokeless tobacco: Never Used  Substance Use Topics  . Alcohol use: Yes    Alcohol/week: 5.0 standard drinks    Types: 5 Shots of liquor per week    Comment: weekends   . Drug use: No     Allergies   Patient has no known allergies.   Review of Systems Review of Systems  Eyes: Positive for pain and discharge (clear tears). Negative for photophobia and visual disturbance.  Allergic/Immunologic: Negative for immunocompromised state.     Physical Exam Updated Vital Signs BP (!) 144/99   Pulse 60    Temp 98 F (36.7 C) (Oral)   Resp 16   Ht 5\' 9"  (1.753 m)   Wt 81.6 kg   SpO2 96%   BMI 26.58 kg/m   Physical Exam Vitals signs and nursing note reviewed.  Constitutional:      General: He is not in acute distress.    Appearance: He is well-developed.     Comments: Sitting in the bed covering his left eye, in no acute distress  HENT:     Head: Normocephalic and atraumatic.  Eyes:     Intraocular pressure: Right eye pressure is 19 mmHg. Left eye pressure is 20 mmHg. Measurements were taken using a handheld tonometer.    Extraocular Movements: Extraocular movements intact.     Conjunctiva/sclera: Conjunctivae normal.     Pupils: Pupils are equal, round, and reactive to light.     Left eye: Corneal abrasion and fluorescein uptake present. Seidel exam negative.     Comments: Mild periorbital swelling of the left eye.  Fluorescein uptake of the left eye at the site of a corneal abrasion.  Negative Seidel sign.  Neck:     Musculoskeletal: Normal range of motion.  Pulmonary:     Effort: Pulmonary effort is normal.  Abdominal:     General: There is no distension.  Musculoskeletal: Normal range of motion.  Skin:    General: Skin is warm.     Findings: No rash.  Neurological:     Mental Status: He is alert and oriented to person, place, and time.      ED Treatments / Results  Labs (all labs ordered are listed, but only abnormal results are displayed) Labs Reviewed - No data to display  EKG None  Radiology No results found.  Procedures Procedures (including critical care time)  Medications Ordered in ED Medications  fluorescein ophthalmic strip 1 strip (1 strip Left Eye Given 10/04/18 0921)  tetracaine (PONTOCAINE) 0.5 % ophthalmic solution 2 drop (2 drops Both Eyes Given 10/04/18 0921)     Initial Impression / Assessment and Plan / ED Course  I have reviewed the triage vital signs and the nursing notes.  Pertinent labs & imaging results that were available during  my care of the patient were reviewed by me and considered in my medical decision making (see chart for details).     Patient resenting for evaluation of left eye pain.  Physical exam shows corneal abrasion.  IOP is reassuring.  Visual acuity is poor, however this was performed before tetracaine numbing drops, and patient was having difficulty opening his eye due to the pain.  Patient reports improvement after tetracaine drops.  Doubt ocular emergency including glaucoma, iritis, ulcer, or globe injury.  Discussed findings with patient.  Discussed treatment of antibiotic eyedrops and careful monitoring.  Patient to follow-up with ophthalmology if symptoms not proving.  At this time, patient appears safe for discharge.  Return precautions given.  Patient states he understands and agrees to plan.  Final Clinical Impressions(s) / ED Diagnoses   Final diagnoses:  Abrasion of left cornea, initial encounter    ED Discharge Orders         Ordered    trimethoprim-polymyxin b (POLYTRIM) ophthalmic solution  Every 4 hours     10/04/18 0950           Franchot Heidelberg, PA-C 10/04/18 1025    Blanchie Dessert, MD 10/04/18 1521

## 2018-11-30 ENCOUNTER — Encounter: Payer: Managed Care, Other (non HMO) | Admitting: Medical

## 2018-12-04 ENCOUNTER — Encounter: Payer: Managed Care, Other (non HMO) | Admitting: Medical

## 2018-12-07 ENCOUNTER — Ambulatory Visit (INDEPENDENT_AMBULATORY_CARE_PROVIDER_SITE_OTHER): Payer: Managed Care, Other (non HMO) | Admitting: Medical

## 2018-12-07 ENCOUNTER — Encounter: Payer: Self-pay | Admitting: Medical

## 2018-12-07 ENCOUNTER — Other Ambulatory Visit: Payer: Self-pay

## 2018-12-07 VITALS — Ht 70.0 in | Wt 180.0 lb

## 2018-12-07 DIAGNOSIS — R229 Localized swelling, mass and lump, unspecified: Secondary | ICD-10-CM

## 2018-12-07 DIAGNOSIS — D1779 Benign lipomatous neoplasm of other sites: Secondary | ICD-10-CM

## 2018-12-07 DIAGNOSIS — IMO0002 Reserved for concepts with insufficient information to code with codable children: Secondary | ICD-10-CM

## 2018-12-07 NOTE — Progress Notes (Signed)
Subjective:     Patient ID: Jorge Walker, male   DOB: [birth year], 53 y.o.   MRN: [id number]  This visit type was conducted due to national recommendations for restrictions regarding the COVID-19 Pandemic (e.g. social distancing) in an effort to limit this patient's exposure and mitigate transmission in our community.  Due to their co-morbid illnesses, this patient is at least at moderate risk for complications without adequate follow up.  This format is felt to be most appropriate for this patient at this time.    Documentation for virtual audio and video telecommunications through Zoom encounter:  The patient was located at home. The provider was located in the office. The patient did consent to this visit and is aware of possible charges through their insurance for this visit.  The other persons participating in this telemedicine service were none. Time spent on call was 12 minutes and in review of previous records >15 minutes total.  This virtual service is not related to other E/M service within previous 7 days.   HPI Chief Complaint  Patient presents with  . Cyst    one on his buttocks and one lower back and another on his stomach.    Virtual visit today for cysts.   Currently has one on buttock, lower back and stomach.   He calls them cysts and they have been there for over a year, as we have discussed them before.   He is calling as they seem to be getting bigger, wants to consider having them removed.     The "cysts" are not red, not swollen, no itching, no pain, no drainage.   They have gotten bigger though this past year.   No other aggravating or relieving factors. No other complaint.   Review of Systems As in subjective    Objective:   Physical Exam Due to coronavirus pandemic stay at home measures, patient visit was virtual and they were not examined in person.    Gen: wd, wn, nad  Exam findings are historical Left upper abdomen with 4-5 cm round broad spongy  soft tissue mass suggestive of lipoma, left upper anterior thigh and anterolateral thigh with spongy 3-4 cm diameter soft tissue masses suggestive of lipoma,  Left buttock medial/and somewhat  Lateral with 2-3 cm diameter soft tissue spongy mass      Assessment:     Encounter Diagnoses  Name Primary?  . Lipoma of other specified sites Yes  . Lump        Plan:     We discussed the limitations of telemedicine including the fact that we had problems with the video conference today so most of today's visit was done over the phone.  I have seen him in the past on more than one occasion for these lumps or cyst he describes.  Prior physical examination suggest these are probably more of a lipoma than cyst or other lesion.  It sounds like he called in just because they have gotten bigger since a year ago.  Based on his current symptoms they are not infected or inflamed but just have gradually gotten bigger  After reviewing his exam findings in the past, we will refer to general surgery for consult on lipomas as he wants them possibly removed  Desean was seen today for cyst.  Diagnoses and all orders for this visit:  Lipoma of other specified sites -     Ambulatory referral to General Surgery  Lump -     Ambulatory referral  to General Surgery

## 2018-12-11 ENCOUNTER — Encounter: Payer: Managed Care, Other (non HMO) | Admitting: Medical

## 2019-01-22 ENCOUNTER — Encounter: Payer: Managed Care, Other (non HMO) | Admitting: Medical

## 2019-02-03 ENCOUNTER — Encounter: Payer: Self-pay | Admitting: Medical

## 2019-02-03 ENCOUNTER — Other Ambulatory Visit: Payer: Self-pay

## 2019-02-03 ENCOUNTER — Ambulatory Visit (INDEPENDENT_AMBULATORY_CARE_PROVIDER_SITE_OTHER): Payer: Managed Care, Other (non HMO) | Admitting: Medical

## 2019-02-03 VITALS — BP 130/78 | HR 63 | Temp 98.4°F | Ht 68.75 in | Wt 176.8 lb

## 2019-02-03 DIAGNOSIS — N503 Cyst of epididymis: Secondary | ICD-10-CM | POA: Diagnosis not present

## 2019-02-03 DIAGNOSIS — R351 Nocturia: Secondary | ICD-10-CM

## 2019-02-03 DIAGNOSIS — D1779 Benign lipomatous neoplasm of other sites: Secondary | ICD-10-CM

## 2019-02-03 DIAGNOSIS — F17200 Nicotine dependence, unspecified, uncomplicated: Secondary | ICD-10-CM

## 2019-02-03 DIAGNOSIS — Z7185 Encounter for immunization safety counseling: Secondary | ICD-10-CM | POA: Insufficient documentation

## 2019-02-03 DIAGNOSIS — D1720 Benign lipomatous neoplasm of skin and subcutaneous tissue of unspecified limb: Secondary | ICD-10-CM

## 2019-02-03 DIAGNOSIS — N400 Enlarged prostate without lower urinary tract symptoms: Secondary | ICD-10-CM

## 2019-02-03 DIAGNOSIS — Z0000 Encounter for general adult medical examination without abnormal findings: Secondary | ICD-10-CM | POA: Diagnosis not present

## 2019-02-03 DIAGNOSIS — Z125 Encounter for screening for malignant neoplasm of prostate: Secondary | ICD-10-CM

## 2019-02-03 DIAGNOSIS — Z7189 Other specified counseling: Secondary | ICD-10-CM

## 2019-02-03 NOTE — Progress Notes (Signed)
Subjective:   HPI  Jorge Walker is a 53 y.o. male who presents for a complete physical.  Medical team: Anav Lammert, Camelia Eng, PA-C Dentist No eye doctor GI: Dr. Scarlette Shorts  Concerns: None, but when prompted about prior elevating PSA, he does endorse some nocturia, some urine hesitancy, some urinary dribbling.     He continues to smoke  He is seeing general surgery for consult to remove cysts of skin, multiple locations  Reviewed their medical, surgical, family, social, medication, and allergy history and updated chart as appropriate.  Past Medical History:  Diagnosis Date  . Allergy   . Arthritis    finger  . BPH (benign prostatic hypertrophy)   . Epididymal cyst   . Tobacco use disorder     Past Surgical History:  Procedure Laterality Date  . ARM WOUND REPAIR / CLOSURE     right upper posterior arm - teenager, s/p trauma  . ARTERY REPAIR     right upper arm - teenager, s/p trauma to right upper arm  . COLONOSCOPY  01/24/2016   Diverticulosis in left and right colon.   Repeat 10 years.  Dr. Scarlette Shorts  . ELBOW SURGERY  2019    Social History   Socioeconomic History  . Marital status: Single    Spouse name: Not on file  . Number of children: Not on file  . Years of education: Not on file  . Highest education level: Not on file  Occupational History  . Not on file  Social Needs  . Financial resource strain: Not on file  . Food insecurity    Worry: Not on file    Inability: Not on file  . Transportation needs    Medical: Not on file    Non-medical: Not on file  Tobacco Use  . Smoking status: Current Every Day Smoker    Packs/day: 0.50    Years: 17.00    Pack years: 8.50    Types: Cigarettes  . Smokeless tobacco: Never Used  Substance and Sexual Activity  . Alcohol use: Yes    Alcohol/week: 5.0 standard drinks    Types: 5 Shots of liquor per week    Comment: weekends   . Drug use: No  . Sexual activity: Not on file  Lifestyle  . Physical  activity    Days per week: Not on file    Minutes per session: Not on file  . Stress: Not on file  Relationships  . Social Herbalist on phone: Not on file    Gets together: Not on file    Attends religious service: Not on file    Active member of club or organization: Not on file    Attends meetings of clubs or organizations: Not on file    Relationship status: Not on file  . Intimate partner violence    Fear of current or ex partner: Not on file    Emotionally abused: Not on file    Physically abused: Not on file    Forced sexual activity: Not on file  Other Topics Concern  . Not on file  Social History Narrative   Single, girlfriend, divorced, has 1 child, 31yo daughter wants to go to college, she lives with mother, exercise with walking at work, works in Scientist, 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  01/2019    Family History  Problem Relation Age of Onset  . Hypertension Mother   . Diabetes Mother   .  Pneumonia Father        died of pneumonia  . Cancer Neg Hx   . Heart disease Neg Hx   . Stroke Neg Hx   . Colon cancer Neg Hx   . Colon polyps Neg Hx   . Esophageal cancer Neg Hx   . Rectal cancer Neg Hx   . Stomach cancer Neg Hx     No current outpatient medications on file.  No Known Allergies   Review of Systems Constitutional: -fever, -chills, -sweats, -unexpected weight change, -decreased appetite, -fatigue Allergy: -sneezing, -itching, -congestion Dermatology: -changing moles, --rash, +lumps ENT: -runny nose, -ear pain, -sore throat, -hoarseness, -sinus pain, -teeth pain, - ringing in ears, -hearing loss, -nosebleeds Cardiology: -chest pain, -palpitations, -swelling, -difficulty breathing when lying flat, -waking up short of breath Respiratory: -cough, -shortness of breath, -difficulty breathing with exercise or exertion, -wheezing, -coughing up blood Gastroenterology: -abdominal pain, -nausea, -vomiting, -diarrhea, -constipation,  -blood in stool, -changes in bowel movement, -difficulty swallowing or eating Hematology: -bleeding, -bruising  Musculoskeletal: -joint aches, -muscle aches, -joint swelling, -back pain, -neck pain, -cramping, -changes in gait Ophthalmology: denies vision changes, eye redness, itching, discharge Urology: -burning with urination, -difficulty urinating, -blood in urine, +urinary frequency, -urgency, -incontinence Neurology: -headache, -weakness, -tingling, -numbness, -memory loss, -falls, -dizziness Psychology: -depressed mood, -agitation, -sleep problems     Objective:   Physical Exam  BP 130/78   Pulse 63   Temp 98.4 F (36.9 C)   Ht 5' 8.75" (1.746 m)   Wt 176 lb 12.8 oz (80.2 kg)   SpO2 95%   BMI 26.30 kg/m    General appearance: alert, no distress, WD/WN, lean AA male Skin:right triceps region with prior trauma scar, no other worrisome lesions HEENT: normocephalic, conjunctiva/corneas normal, sclerae anicteric, PERRLA, EOMi, nares patent, no discharge or erythema, pharynx normal Oral cavity: MMM, tongue normal, teeth with upper dentures, otherwise in good repair Neck: supple, no lymphadenopathy, no thyromegaly, no masses, normal ROM, no bruits Chest: non tender, normal shape and expansion Heart: RRR, normal S1, S2, no murmurs Lungs: CTA bilaterally, no wheezes, rhonchi, or rales Abdomen: +bs, soft, non tender, non distended, no masses, no hepatomegaly, no splenomegaly, no bruits Back: non tender, normal ROM, no scoliosis Musculoskeletal: left buttock midline with 3-4cm deep round palpable mass, lipoma, nontender, left upper anterior thigh with 4cm not well defined but fatty tissue lump most likely lipoma, otherwise upper extremities non tender, no obvious deformity, normal ROM throughout, lower extremities non tender, no obvious deformity, normal ROM throughout Extremities: no edema, no cyanosis, no clubbing Pulses: 2+ symmetric, upper and lower extremities, normal cap  refill Neurological: alert, oriented x 3, CN2-12 intact, strength normal upper extremities and lower extremities, sensation normal throughout, DTRs 2+ throughout, no cerebellar signs, gait normal Psychiatric: normal affect, behavior normal, pleasant  GU: normal male external genitalia, multiple nodules round and hard bilat superior to testes suggestive of epididymal cysts as noted on prior 2009 ultrasound,  no hernia, no lymphadenopathy Rectal: anus normal tone, prostate generally enlarged.  No nodules.   Assessment and Plan :   Encounter Diagnoses  Name Primary?  . Encounter for health maintenance examination in adult Yes  . TOBACCO ABUSE   . Epididymal cyst   . Benign prostatic hyperplasia without lower urinary tract symptoms   . Screening for prostate cancer   . Nocturia   . Lipoma of extremity   . Vaccine counseling    Physical exam - discussed and counseled on healthy lifestyle, diet, exercise, preventative  care, vaccinations, sick and well care, proper use of emergency dept and after hours care, and addressed their concerns.    Health screening: Consider  See your eye doctor yearly for routine vision care. See your dentist yearly for routine dental care including hygiene visits twice yearly.  Cancer screening  Colonoscopy:  Reviewed colonoscopy on file that is up to date  Discussed PSA, prostate exam, and prostate cancer screening risks/benefits.     Vaccinations: Advised yearly influenza vaccine  Shingles vaccine:  I recommend you have a shingles vaccine to help prevent shingles or herpes zoster outbreak.   Please call your insurer to inquire about coverage for the Shingrix vaccine given in 2 doses.   Some insurers cover this vaccine after age 46, some cover this after age 11.  If your insurer covers this, then call to schedule appointment to have this vaccine here.   Separate significant issues discussed:  Strongly advised smoking cessation.  We discussed medication  options, counseling.  He will consider.  BPH - consider therapy, await PSA  Lipomas - f/u with general surgery as planned  Spermatoceles within scrotum, unchanged, reassured   Follow-up pending labs, yearly for physical   Javonte was seen today for annual exam.  Diagnoses and all orders for this visit:  Encounter for health maintenance examination in adult -     Comprehensive metabolic panel -     CBC -     PSA, total and free  TOBACCO ABUSE  Epididymal cyst  Benign prostatic hyperplasia without lower urinary tract symptoms -     PSA, total and free  Screening for prostate cancer -     PSA, total and free  Nocturia  Lipoma of extremity  Vaccine counseling

## 2019-02-03 NOTE — Patient Instructions (Signed)
Thanks for trusting [place] with your health care and for coming in for a physical today.  Below are some general recommendations I have for you:  Yearly screenings See your eye doctor yearly for routine vision care. See your dentist yearly for routine dental care including hygiene visits twice yearly. See me here yearly for a routine physical and preventative care visit   Specific Concerns today:  . You have benign spermatoceles (lumps) over testicles in the scrotum.   These are not harmful. . Continue plan for follow up with the general surgeon regarding the fatty lumps/lipomas . Pending labs I will likely start you on medication for enlarged prostate gland (BPH) . Shingles vaccine:  I recommend you have a shingles vaccine to help prevent shingles or herpes zoster outbreak.   Please call your insurer to inquire about coverage for the Shingrix vaccine given in 2 doses.   Some insurers cover this vaccine after age 60, some cover this after age 69.  If your insurer covers this, then call to schedule appointment to have this vaccine here. Marland Kitchen STOP TOBACCO.   If agreeable, I can put you on some medication to help quit tobacco     Benign Prostatic Hyperplasia  Benign prostatic hyperplasia (BPH) is an enlarged prostate gland that is caused by the normal aging process and not by cancer. The prostate is a walnut-sized gland that is involved in the production of semen. It is located in front of the rectum and below the bladder. The bladder stores urine and the urethra is the tube that carries the urine out of the body. The prostate may get bigger as a man gets older. An enlarged prostate can press on the urethra. This can make it harder to pass urine. The build-up of urine in the bladder can cause infection. Back pressure and infection may progress to bladder damage and kidney (renal) failure. What are the causes? This condition is part of a normal aging process. However, not all men develop problems from  this condition. If the prostate enlarges away from the urethra, urine flow will not be blocked. If it enlarges toward the urethra and compresses it, there will be problems passing urine. What increases the risk? This condition is more likely to develop in men over the age of 81 years. What are the signs or symptoms? Symptoms of this condition include:  Getting up often during the night to urinate.  Needing to urinate frequently during the day.  Difficulty starting urine flow.  Decrease in size and strength of your urine stream.  Leaking (dribbling) after urinating.  Inability to pass urine. This needs immediate treatment.  Inability to completely empty your bladder.  Pain when you pass urine. This is more common if there is also an infection.  Urinary tract infection (UTI). How is this diagnosed? This condition is diagnosed based on your medical history, a physical exam, and your symptoms. Tests will also be done, such as:  A post-void bladder scan. This measures any amount of urine that may remain in your bladder after you finish urinating.  A digital rectal exam. In a rectal exam, your health care provider checks your prostate by putting a lubricated, gloved finger into your rectum to feel the back of your prostate gland. This exam detects the size of your gland and any abnormal lumps or growths.  An exam of your urine (urinalysis).  A prostate specific antigen (PSA) screening. This is a blood test used to screen for prostate cancer.  An ultrasound. This test uses sound waves to electronically produce a picture of your prostate gland. Your health care provider may refer you to a specialist in kidney and prostate diseases (urologist). How is this treated? Once symptoms begin, your health care provider will monitor your condition (active surveillance or watchful waiting). Treatment for this condition will depend on the severity of your condition. Treatment may include:   Observation and yearly exams. This may be the only treatment needed if your condition and symptoms are mild.  Medicines to relieve your symptoms, including: ? Medicines to shrink the prostate. ? Medicines to relax the muscle of the prostate.  Surgery in severe cases. Surgery may include: ? Prostatectomy. In this procedure, the prostate tissue is removed completely through an open incision or with a laparoscope or robotics. ? Transurethral resection of the prostate (TURP). In this procedure, a tool is inserted through the opening at the tip of the penis (urethra). It is used to cut away tissue of the inner core of the prostate. The pieces are removed through the same opening of the penis. This removes the blockage. ? Transurethral incision (TUIP). In this procedure, small cuts are made in the prostate. This lessens the prostate's pressure on the urethra. ? Transurethral microwave thermotherapy (TUMT). This procedure uses microwaves to create heat. The heat destroys and removes a small amount of prostate tissue. ? Transurethral needle ablation (TUNA). This procedure uses radio frequencies to destroy and remove a small amount of prostate tissue. ? Interstitial laser coagulation ([place]). This procedure uses a laser to destroy and remove a small amount of prostate tissue. ? Transurethral electrovaporization (TUVP). This procedure uses electrodes to destroy and remove a small amount of prostate tissue. ? Prostatic urethral lift. This procedure inserts an implant to push the lobes of the prostate away from the urethra. Follow these instructions at home:  Take over-the-counter and prescription medicines only as told by your health care provider.  Monitor your symptoms for any changes. Contact your health care provider with any changes.  Avoid drinking large amounts of liquid before going to bed or out in public.  Avoid or reduce how much caffeine or alcohol you drink.  Give yourself time when you  urinate.  Keep all follow-up visits as told by your health care provider. This is important. Contact a health care provider if:  You have unexplained back pain.  Your symptoms do not get better with treatment.  You develop side effects from the medicine you are taking.  Your urine becomes very dark or has a bad smell.  Your lower abdomen becomes distended and you have trouble passing your urine. Get help right away if:  You have a fever or chills.  You suddenly cannot urinate.  You feel lightheaded, or very dizzy, or you faint.  There are large amounts of blood or clots in the urine.  Your urinary problems become hard to manage.  You develop moderate to severe low back or flank pain. The flank is the side of your body between the ribs and the hip. These symptoms may represent a serious problem that is an emergency. Do not wait to see if the symptoms will go away. Get medical help right away. Call your local emergency services (911 in the [place] Do not drive yourself to the hospital. Summary  Benign prostatic hyperplasia (BPH) is an enlarged prostate that is caused by the normal aging process and not by cancer.  An enlarged prostate can press on the urethra. This  can make it hard to pass urine.  This condition is part of a normal aging process and is more likely to develop in men over the age of 63 years.  Get help right away if you suddenly cannot urinate. This information is not intended to replace advice given to you by your health care provider. Make sure you discuss any questions you have with your health care provider. Document Released: 08/12/2005 Document Revised: 09/16/2016 Document Reviewed: 09/16/2016 Elsevier Interactive Patient Education  2019 [employer].    Please follow up yearly for a physical.   Preventative Care for Adults - Male      [place]:  A routine yearly physical is a good way to check in with your primary care  provider about your health and preventive screening. It is also an opportunity to share updates about your health and any concerns you have, and receive a thorough all-over exam.   Most health insurance companies pay for at least some preventative services.  Check with your health plan for specific coverages.  WHAT PREVENTATIVE SERVICES DO MEN NEED?  Adult men should have their weight and blood pressure checked regularly.   Men age 52 and older should have their cholesterol levels checked regularly.  Beginning at age 26 and continuing to age 37, men should be screened for colorectal cancer.  Certain people may need continued testing until age 49.  Updating vaccinations is part of preventative care.  Vaccinations help protect against diseases such as the flu.  Osteoporosis is a disease in which the bones lose minerals and strength as we age. Men ages 37 and over should discuss this with their caregivers  Lab tests are generally done as part of preventative care to screen for anemia and blood disorders, to screen for problems with the kidneys and liver, to screen for bladder problems, to check blood sugar, and to check your cholesterol level.  Preventative services generally include counseling about diet, exercise, avoiding tobacco, drugs, excessive alcohol consumption, and sexually transmitted infections.    GENERAL RECOMMENDATIONS FOR GOOD HEALTH:  Healthy diet:  Eat a variety of foods, including fruit, vegetables, animal or vegetable protein, such as meat, fish, chicken, and eggs, or beans, lentils, tofu, and grains, such as rice.  Drink plenty of water daily.  Decrease saturated fat in the diet, avoid lots of red meat, processed foods, sweets, fast foods, and fried foods.  Exercise:  Aerobic exercise helps maintain good heart health. At least 30-40 minutes of moderate-intensity exercise is recommended. For example, a brisk walk that increases your heart rate and breathing. This  should be done on most days of the week.   Find a type of exercise or a variety of exercises that you enjoy so that it becomes a part of your daily life.  Examples are running, walking, swimming, water aerobics, and biking.  For motivation and support, explore group exercise such as aerobic class, spin class, Zumba, Yoga,or  martial arts, etc.    Set exercise goals for yourself, such as a certain weight goal, walk or run in a race such as a 5k walk/run.  Speak to your primary care provider about exercise goals.  Disease prevention:  If you smoke or chew tobacco, find out from your caregiver how to quit. It can literally save your life, no matter how long you have been a tobacco user. If you do not use tobacco, never begin.   Maintain a healthy diet and normal weight. Increased weight leads  to problems with blood pressure and diabetes.   The Body Mass Index or BMI is a way of measuring how much of your body is fat. Having a BMI above 27 increases the risk of heart disease, diabetes, hypertension, stroke and other problems related to obesity. Your caregiver can help determine your BMI and based on it develop an exercise and dietary program to help you achieve or maintain this important measurement at a healthful level.  High blood pressure causes heart and blood vessel problems.  Persistent high blood pressure should be treated with medicine if weight loss and exercise do not work.   Fat and cholesterol leaves deposits in your arteries that can block them. This causes heart disease and vessel disease elsewhere in your body.  If your cholesterol is found to be high, or if you have heart disease or certain other medical conditions, then you may need to have your cholesterol monitored frequently and be treated with medication.   Ask if you should have a cardiac stress test if your history suggests this. A stress test is a test done on a treadmill that looks for heart disease. This test can find disease  prior to there being a problem.  Osteoporosis is a disease in which the bones lose minerals and strength as we age. This can result in serious bone fractures. Risk of osteoporosis can be identified using a bone density scan. Men ages 24 and over should discuss this with their caregivers. Ask your caregiver whether you should be taking a calcium supplement and Vitamin D, to reduce the rate of osteoporosis.   Avoid drinking alcohol in excess (more than two drinks per day).  Avoid use of street drugs. Do not share needles with anyone. Ask for professional help if you need assistance or instructions on stopping the use of alcohol, cigarettes, and/or drugs.  Brush your teeth twice a day with fluoride toothpaste, and floss once a day. Good oral hygiene prevents tooth decay and gum disease. The problems can be painful, unattractive, and can cause other health problems. Visit your dentist for a routine oral and dental check up and preventive care every 6-12 months.   Look at your skin regularly.  Use a mirror to look at your back. Notify your caregivers of changes in moles, especially if there are changes in shapes, colors, a size larger than a pencil eraser, an irregular border, or development of new moles.  Safety:  Use seatbelts 100% of the time, whether driving or as a passenger.  Use safety devices such as hearing protection if you work in environments with loud noise or significant background noise.  Use safety glasses when doing any work that could send debris in to the eyes.  Use a helmet if you ride a bike or motorcycle.  Use appropriate safety gear for contact sports.  Talk to your caregiver about gun safety.  Use sunscreen with a SPF (or skin protection factor) of 15 or greater.  Lighter skinned people are at a greater risk of skin cancer. Don't forget to also wear sunglasses in order to protect your eyes from too much damaging sunlight. Damaging sunlight can accelerate cataract formation.    Practice safe sex. Use condoms. Condoms are used for birth control and to help reduce the spread of sexually transmitted infections (or STIs).  Some of the STIs are gonorrhea (the clap), chlamydia, syphilis, trichomonas, herpes, HPV (human papilloma virus) and HIV (human immunodeficiency virus) which causes AIDS. The herpes, HIV and HPV are  viral illnesses that have no cure. These can result in disability, cancer and death.   Keep carbon monoxide and smoke detectors in your home functioning at all times. Change the batteries every 6 months or use a model that plugs into the wall.   Vaccinations:  Stay up to date with your tetanus shots and other required immunizations. You should have a booster for tetanus every 10 years. Be sure to get your flu shot every year, since 5%-20% of the [place] population comes down with the flu. The flu vaccine changes each year, so being vaccinated once is not enough. Get your shot in the fall, before the flu season peaks.   Other vaccines to consider:  Human Papilloma Virus or HPV causes cancer of the cervix, and other infections that can be transmitted from person to person. There is a vaccine for HPV, and males should get immunized between the ages of 92 and 52. It requires a series of 3 shots.   Pneumococcal vaccine to protect against certain types of pneumonia.  This is normally recommended for adults age 40 or older.  However, adults younger than 54 years old with certain underlying conditions such as diabetes, heart or lung disease should also receive the vaccine.  Shingles vaccine to protect against Varicella Zoster if you are older than age 81, or younger than 54 years old with certain underlying illness.  If you have not had the Shingrix vaccine, please call your insurer to inquire about coverage for the Shingrix vaccine given in 2 doses.   Some insurers cover this vaccine after age 9, some cover this after age 87.  If your insurer covers this, then call to  schedule appointment to have this vaccine here  Hepatitis A vaccine to protect against a form of infection of the liver by a virus acquired from food.  Hepatitis B vaccine to protect against a form of infection of the liver by a virus acquired from blood or body fluids, particularly if you work in health care.  If you plan to travel internationally, check with your local health department for specific vaccination recommendations.   What should I know about cancer screening? Many types of cancers can be detected early and may often be prevented. Lung Cancer  You should be screened every year for lung cancer if: ? You are a current smoker who has smoked for at least 30 years. ? You are a former smoker who has quit within the past 15 years.  Talk to your health care provider about your screening options, when you should start screening, and how often you should be screened.  Colorectal Cancer  Routine colorectal cancer screening usually begins at 54 years of age and should be repeated every 5-10 years until you are 54 years old. You may need to be screened more often if early forms of precancerous polyps or small growths are found. Your health care provider may recommend screening at an earlier age if you have risk factors for colon cancer.  Your health care provider may recommend using home test kits to check for hidden blood in the stool.  A small camera at the end of a tube can be used to examine your colon (sigmoidoscopy or colonoscopy). This checks for the earliest forms of colorectal cancer.  Prostate and Testicular Cancer  Depending on your age and overall health, your health care provider may do certain tests to screen for prostate and testicular cancer.  Talk to your health care provider about any  symptoms or concerns you have about testicular or prostate cancer.  Skin Cancer  Check your skin from head to toe regularly.  Tell your health care provider about any new moles or  changes in moles, especially if: ? There is a change in a mole's size, shape, or color. ? You have a mole that is larger than a pencil eraser.  Always use sunscreen. Apply sunscreen liberally and repeat throughout the day.  Protect yourself by wearing long sleeves, pants, a wide-brimmed hat, and sunglasses when outside.

## 2019-02-04 LAB — PSA, TOTAL AND FREE
PSA, Free Pct: 14.8 %
PSA, Free: 0.71 ng/mL
Prostate Specific Ag, Serum: 4.8 ng/mL — ABNORMAL HIGH (ref 0.0–4.0)

## 2019-02-04 LAB — COMPREHENSIVE METABOLIC PANEL
ALT: 13 IU/L (ref 0–44)
AST: 20 IU/L (ref 0–40)
Albumin/Globulin Ratio: 1.8 (ref 1.2–2.2)
Albumin: 4.4 g/dL (ref 3.8–4.9)
Alkaline Phosphatase: 56 IU/L (ref 39–117)
BUN/Creatinine Ratio: 12 (ref 9–20)
BUN: 13 mg/dL (ref 6–24)
Bilirubin Total: 0.4 mg/dL (ref 0.0–1.2)
CO2: 23 mmol/L (ref 20–29)
Calcium: 9.9 mg/dL (ref 8.7–10.2)
Chloride: 107 mmol/L — ABNORMAL HIGH (ref 96–106)
Creatinine, Ser: 1.08 mg/dL (ref 0.76–1.27)
GFR calc Af Amer: 90 mL/min/{1.73_m2} (ref >59)
GFR calc non Af Amer: 78 mL/min/{1.73_m2} (ref >59)
Globulin, Total: 2.5 g/dL (ref 1.5–4.5)
Glucose: 90 mg/dL (ref 65–99)
Potassium: 4.1 mmol/L (ref 3.5–5.2)
Sodium: 144 mmol/L (ref 134–144)
Total Protein: 6.9 g/dL (ref 6.0–8.5)

## 2019-02-04 LAB — CBC
Hematocrit: 39 % (ref 37.5–51.0)
Hemoglobin: 12.7 g/dL — ABNORMAL LOW (ref 13.0–17.7)
MCH: 27.5 pg (ref 26.6–33.0)
MCHC: 32.6 g/dL (ref 31.5–35.7)
MCV: 84 fL (ref 79–97)
Platelets: 194 10*3/uL (ref 150–450)
RBC: 4.62 x10E6/uL (ref 4.14–5.80)
RDW: 13.7 % (ref 11.6–15.4)
WBC: 7 10*3/uL (ref 3.4–10.8)

## 2019-02-05 ENCOUNTER — Encounter: Payer: Self-pay | Admitting: Medical

## 2019-02-05 ENCOUNTER — Other Ambulatory Visit: Payer: Self-pay | Admitting: Medical

## 2019-02-05 MED ORDER — FINASTERIDE 5 MG PO TABS: 5.0000 mg | ORAL_TABLET | Freq: Every day | ORAL | 1 refills | Status: AC

## 2019-04-02 ENCOUNTER — Ambulatory Visit (INDEPENDENT_AMBULATORY_CARE_PROVIDER_SITE_OTHER): Payer: Managed Care, Other (non HMO) | Admitting: Medical

## 2019-04-02 ENCOUNTER — Encounter: Payer: Self-pay | Admitting: Medical

## 2019-04-02 ENCOUNTER — Other Ambulatory Visit: Payer: Self-pay

## 2019-04-02 VITALS — BP 120/72 | HR 53 | Temp 98.9°F | Resp 16 | Ht 69.5 in | Wt 179.4 lb

## 2019-04-02 DIAGNOSIS — R710 Precipitous drop in hematocrit: Secondary | ICD-10-CM | POA: Insufficient documentation

## 2019-04-02 DIAGNOSIS — R972 Elevated prostate specific antigen [PSA]: Secondary | ICD-10-CM | POA: Insufficient documentation

## 2019-04-02 LAB — CBC WITH DIFFERENTIAL/PLATELET
Basophils Absolute: 0 10*3/uL (ref 0.0–0.2)
Basos: 1 %
EOS (ABSOLUTE): 0.4 10*3/uL (ref 0.0–0.4)
Eos: 6 %
Hematocrit: 36.5 % — ABNORMAL LOW (ref 37.5–51.0)
Hemoglobin: 11.9 g/dL — ABNORMAL LOW (ref 13.0–17.7)
Immature Grans (Abs): 0 10*3/uL (ref 0.0–0.1)
Immature Granulocytes: 0 %
Lymphocytes Absolute: 2.7 10*3/uL (ref 0.7–3.1)
Lymphs: 39 %
MCH: 27.5 pg (ref 26.6–33.0)
MCHC: 32.6 g/dL (ref 31.5–35.7)
MCV: 84 fL (ref 79–97)
Monocytes Absolute: 0.6 10*3/uL (ref 0.1–0.9)
Monocytes: 9 %
Neutrophils Absolute: 3.1 10*3/uL (ref 1.4–7.0)
Neutrophils: 45 %
Platelets: 186 10*3/uL (ref 150–450)
RBC: 4.33 x10E6/uL (ref 4.14–5.80)
RDW: 13.7 % (ref 11.6–15.4)
WBC: 6.8 10*3/uL (ref 3.4–10.8)

## 2019-04-02 NOTE — Progress Notes (Signed)
Subjective: Chief Complaint  Patient presents with  . follow up    follow up   Here for recheck from physical from June 2020.  At that time his PSA had elevated from the previous year.  He was experiencing some BPH symptoms as well.  He took finasteride for 1 month but did not get the refill.  Thinking back he does note some improvement with the finasteride.  He is here today for repeat PSA blood test.  At his physical his hemoglobin has also dropped slightly.  He denies any bleeding or bruising.  No blood in the stool no blood in the urine.  Otherwise normal state of health, feeling healthy.  Eating a variety of vegetables and meat, drinks some alcohol.  No history of anemia.  He is up-to-date on colonoscopy.  He recently saw general surgery for lipoma excision.  He is having some swelling localized in the left abdomen area where 1 of the lipomas was removed.  He has been back to see general surgery and has follow-up again planned.  Past Medical History:  Diagnosis Date  . Allergy   . Arthritis    finger  . BPH (benign prostatic hypertrophy)   . Epididymal cyst   . Tobacco use disorder     Family History  Problem Relation Age of Onset  . Hypertension Mother   . Diabetes Mother   . Pneumonia Father        died of pneumonia  . Cancer Neg Hx   . Heart disease Neg Hx   . Stroke Neg Hx   . Colon cancer Neg Hx   . Colon polyps Neg Hx   . Esophageal cancer Neg Hx   . Rectal cancer Neg Hx   . Stomach cancer Neg Hx    ROS as in subjective    Objective: BP 120/72   Pulse (!) 53   Temp 98.9 F (37.2 C) (Temporal)   Resp 16   Ht 5' 9.5" (1.765 m)   Wt 179 lb 6.4 oz (81.4 kg)   SpO2 98%   BMI 26.11 kg/m   Wt Readings from Last 3 Encounters:  04/02/19 179 lb 6.4 oz (81.4 kg)  02/03/19 176 lb 12.8 oz (80.2 kg)  12/07/18 180 lb (81.6 kg)    Gen: wd, wn, nad Left upper abdomen with linear surgical scar and localized small puffiness, no obvious mass Otherwise not  examined    Assessment: Encounter Diagnoses  Name Primary?  . Elevated PSA Yes  . Drop in hemoglobin      Plan: Elevated PSA-discussed possible causes of elevated PSA.  He does have some BPH symptoms.  We discussed that the PSA can be associated with prostatitis, BPH, prostate cancer.  We discussed the pros and cons of PSA testing in follow-up evaluation when the PSA is elevated.  He did take 1 month of finasteride in June 2020.  Follow-up pending lab  Drop in hemoglobin noted on labs from June.  He is up-to-date on colonoscopy which was reviewed from 2017.  This could have been just a lab fluctuation.  Repeat CBC today.  He will follow-up with general surgery regarding the recent surgery and localized swelling  Jorge Walker was seen today for follow up.  Diagnoses and all orders for this visit:  Elevated PSA -     PSA, total and free -     CBC with Differential/Platelet  Drop in hemoglobin

## 2019-04-02 NOTE — Patient Instructions (Signed)
Prostate-Specific Antigen Test Why am I having this test? The prostate-specific antigen (PSA) test is a screening test for prostate cancer. It can identify early signs of prostate cancer, which may allow for more effective treatment. Your health care provider may recommend that you have a PSA test starting at age 54 or that you have one earlier or later, depending on your risk factors for prostate cancer. You may also have a PSA test:  To monitor treatment of prostate cancer.  To check whether prostate cancer has returned after treatment.  If you have signs of other conditions that can affect PSA levels, such as: ? An enlarged prostate that is not caused by cancer (benign prostatic hyperplasia, BPH). This condition is very common in older men. ? A prostate infection. What is being tested? This test measures the amount of PSA in your blood. PSA is a protein that is made in the prostate. The prostate naturally produces more PSA as you age, but very high levels may be a sign of a medical condition. What kind of sample is taken?  A blood sample is required for this test. It is usually collected by inserting a needle into a blood vessel or by sticking a finger with a small needle. Blood for this test should be drawn before having an exam of the prostate. How do I prepare for this test? Do not ejaculate starting 24 hours before your test, or as long as told by your health care provider. Tell a health care provider about:  Any allergies you have.  All medicines you are taking, including vitamins, herbs, eye drops, creams, and over-the-counter medicines. This also includes: ? Medicines to assist with hair growth, such as finasteride. ? Any recent exposure to a medicine called diethylstilbestrol.  Any blood disorders you have.  Any recent procedures you have had, especially any procedures involving the prostate or rectum.  Any medical conditions you have.  Any recent urinary tract infections  (UTIs) you have had. How are the results reported? Your test results will be reported as a value that indicates how much PSA is in your blood. This will be given as nanograms of PSA per milliliter of blood (ng/mL). Your health care provider will compare your results to normal ranges that were established after testing a large group of people (reference ranges). Reference ranges may vary among labs and hospitals. PSA levels vary from person to person and generally increase with age. Because of this variation, there is no single PSA value that is considered normal for everyone. Instead, PSA reference ranges are used to describe whether your PSA levels are considered low or high (elevated). Common reference ranges are:  Low: 0-2.5 ng/mL.  Slightly to moderately elevated: 2.6-10.0 ng/mL.  Moderately elevated: 10.0-19.9 ng/mL.  Significantly elevated: 20 ng/mL or greater. Sometimes, the test results may report that a condition is present when it is not present (false-positive result). What do the results mean? A test result that is higher than 4 ng/mL may mean that you are at an increased risk for prostate cancer. However, a PSA test by itself is not enough to diagnose prostate cancer. High PSA levels may also be caused by the natural aging process, prostate infection, or BPH. PSA screening cannot tell you if your PSA is high due to cancer or a different cause. A prostate biopsy is the only way to diagnose prostate cancer. A risk of having the PSA test is diagnosing and treating prostate cancer that would never have caused any   symptoms or problems (overdiagnosis and overtreatment). Talk with your health care provider about what your results mean. Questions to ask your health care provider Ask your health care provider, or the department that is doing the test:  When will my results be ready?  How will I get my results?  What are my treatment options?  What other tests do I need?  What are my  next steps? Summary  The prostate-specific antigen (PSA) test is a screening test for prostate cancer.  Your health care provider may recommend that you have a PSA test starting at age 46 or that you have one earlier or later, depending on your risk factors for prostate cancer.  A test result that is higher than 4 ng/mL may mean that you are at an increased risk for prostate cancer. However, elevated levels can be caused by a number of conditions other than prostate cancer.  Talk with your health care provider about what your results mean. This information is not intended to replace advice given to you by your health care provider. Make sure you discuss any questions you have with your health care provider. Document Released: 09/14/2004 Document Revised: 07/25/2017 Document Reviewed: 05/19/2017 Elsevier Patient Education  2020 [employer].

## 2019-04-03 LAB — PSA, TOTAL AND FREE
PSA, Free Pct: 14 %
PSA, Free: 0.49 ng/mL
Prostate Specific Ag, Serum: 3.5 ng/mL (ref 0.0–4.0)

## 2019-04-05 NOTE — Progress Notes (Signed)
Left message on voicemail for patient to call back. 

## 2019-05-05 ENCOUNTER — Other Ambulatory Visit: Payer: Self-pay | Admitting: Medical

## 2019-05-05 ENCOUNTER — Telehealth: Payer: Self-pay | Admitting: Internal Medicine

## 2019-05-05 DIAGNOSIS — R710 Precipitous drop in hematocrit: Secondary | ICD-10-CM

## 2019-05-05 DIAGNOSIS — R972 Elevated prostate specific antigen [PSA]: Secondary | ICD-10-CM

## 2019-05-05 DIAGNOSIS — N400 Enlarged prostate without lower urinary tract symptoms: Secondary | ICD-10-CM

## 2019-05-05 NOTE — Telephone Encounter (Signed)
Please put in labs in for pt. He is on the schedule for tomorrow

## 2019-05-05 NOTE — Telephone Encounter (Signed)
Done

## 2019-05-05 NOTE — Progress Notes (Signed)
Cbc

## 2019-05-06 ENCOUNTER — Other Ambulatory Visit (INDEPENDENT_AMBULATORY_CARE_PROVIDER_SITE_OTHER): Payer: Managed Care, Other (non HMO)

## 2019-05-06 DIAGNOSIS — R710 Precipitous drop in hematocrit: Secondary | ICD-10-CM

## 2019-05-06 DIAGNOSIS — N400 Enlarged prostate without lower urinary tract symptoms: Secondary | ICD-10-CM

## 2019-05-06 DIAGNOSIS — R972 Elevated prostate specific antigen [PSA]: Secondary | ICD-10-CM

## 2019-05-07 LAB — CBC WITH DIFFERENTIAL/PLATELET
Basophils Absolute: 0.1 10*3/uL (ref 0.0–0.2)
Basos: 1 %
EOS (ABSOLUTE): 0.4 10*3/uL (ref 0.0–0.4)
Eos: 5 %
Hematocrit: 37.9 % (ref 37.5–51.0)
Hemoglobin: 12.5 g/dL — ABNORMAL LOW (ref 13.0–17.7)
Immature Grans (Abs): 0 10*3/uL (ref 0.0–0.1)
Immature Granulocytes: 0 %
Lymphocytes Absolute: 3 10*3/uL (ref 0.7–3.1)
Lymphs: 41 %
MCH: 28 pg (ref 26.6–33.0)
MCHC: 33 g/dL (ref 31.5–35.7)
MCV: 85 fL (ref 79–97)
Monocytes Absolute: 0.6 10*3/uL (ref 0.1–0.9)
Monocytes: 9 %
Neutrophils Absolute: 3.2 10*3/uL (ref 1.4–7.0)
Neutrophils: 44 %
Platelets: 186 10*3/uL (ref 150–450)
RBC: 4.46 x10E6/uL (ref 4.14–5.80)
RDW: 13.6 % (ref 11.6–15.4)
WBC: 7.2 10*3/uL (ref 3.4–10.8)

## 2019-05-07 LAB — PSA: Prostate Specific Ag, Serum: 3.2 ng/mL (ref 0.0–4.0)

## 2019-05-20 ENCOUNTER — Other Ambulatory Visit: Payer: Managed Care, Other (non HMO)

## 2019-05-27 ENCOUNTER — Other Ambulatory Visit (INDEPENDENT_AMBULATORY_CARE_PROVIDER_SITE_OTHER): Payer: Managed Care, Other (non HMO)

## 2019-05-27 ENCOUNTER — Other Ambulatory Visit: Payer: Self-pay

## 2019-05-27 DIAGNOSIS — Z1211 Encounter for screening for malignant neoplasm of colon: Secondary | ICD-10-CM | POA: Diagnosis not present

## 2019-05-27 LAB — HEMOCCULT GUIAC POC 1CARD (OFFICE)
Card #2 Fecal Occult Blod, POC: NEGATIVE
Card #3 Fecal Occult Blood, POC: NEGATIVE
Fecal Occult Blood, POC: NEGATIVE

## 2019-05-28 ENCOUNTER — Other Ambulatory Visit: Payer: Self-pay | Admitting: Medical

## 2019-05-28 DIAGNOSIS — D649 Anemia, unspecified: Secondary | ICD-10-CM

## 2019-05-28 MED ORDER — FERROUS GLUCONATE 324 (38 FE) MG PO TABS: 324.0000 mg | ORAL_TABLET | Freq: Every day | ORAL | 2 refills | Status: DC

## 2019-07-06 ENCOUNTER — Ambulatory Visit (INDEPENDENT_AMBULATORY_CARE_PROVIDER_SITE_OTHER): Payer: Managed Care, Other (non HMO) | Admitting: Medical

## 2019-07-06 ENCOUNTER — Encounter: Payer: Self-pay | Admitting: Medical

## 2019-07-06 VITALS — BP 138/86 | HR 67 | Temp 98.0°F | Ht 69.5 in | Wt 175.2 lb

## 2019-07-06 DIAGNOSIS — R1030 Lower abdominal pain, unspecified: Secondary | ICD-10-CM | POA: Diagnosis not present

## 2019-07-06 DIAGNOSIS — R319 Hematuria, unspecified: Secondary | ICD-10-CM | POA: Diagnosis not present

## 2019-07-06 DIAGNOSIS — M25512 Pain in left shoulder: Secondary | ICD-10-CM

## 2019-07-06 DIAGNOSIS — M542 Cervicalgia: Secondary | ICD-10-CM

## 2019-07-06 DIAGNOSIS — M25511 Pain in right shoulder: Secondary | ICD-10-CM

## 2019-07-06 DIAGNOSIS — Z113 Encounter for screening for infections with a predominantly sexual mode of transmission: Secondary | ICD-10-CM

## 2019-07-06 LAB — POCT URINALYSIS DIP (PROADVANTAGE DEVICE)
Bilirubin, UA: NEGATIVE
Glucose, UA: NEGATIVE mg/dL
Ketones, POC UA: NEGATIVE mg/dL
Leukocytes, UA: NEGATIVE
Nitrite, UA: NEGATIVE
Protein Ur, POC: NEGATIVE mg/dL
Specific Gravity, Urine: 1.03
Urobilinogen, Ur: NEGATIVE
pH, UA: 6 (ref 5.0–8.0)

## 2019-07-06 NOTE — Progress Notes (Signed)
Verdis Frederickson has sent for culture

## 2019-07-06 NOTE — Progress Notes (Signed)
Subjective: Chief Complaint  Patient presents with  . Shoulder Pain    bilateral   . Neck Pain    left side   Here for shoulder pain on both sides, neck pain x weeks, but worse in recent days. 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  Does this work daily in general.  Is right handed.  For weeks has been having pains in shoulders and neck.  No particular trauma or fall, but did have one particular day at work felt worse pain without specific trauma.   Has tingling in left hand in general. No specific numbness.   At times feels weak in hands.   Has been trying work easier due to the pains.  The majority of pains is shoulders and neck, not the arms.   No lower back pain.  Has used some OTC analgesic for symptoms from time to time.  Hasn't seen any other medical provider for this.    For past 3 days  noticed some blood and clots in the urine .  Has some irritation with urination.  Is sexually active x 5 years with current partner.   No prior prostate infection.  Has had some cloudy urine.   No odor.  Drinks a lot of soda.   No other aggravating or relieving factors. No other complaint.  Past Medical History:  Diagnosis Date  . Allergy   . Arthritis    finger  . BPH (benign prostatic hypertrophy)   . Epididymal cyst   . Tobacco use disorder    Current Outpatient Medications on File Prior to Visit  Medication Sig Dispense Refill  . ferrous gluconate (FERGON) 324 MG tablet Take 1 tablet (324 mg total) by mouth daily with breakfast. (Patient not taking: Reported on 07/06/2019) 30 tablet 2  . finasteride (PROSCAR) 5 MG tablet Take 1 tablet (5 mg total) by mouth daily. (Patient not taking: Reported on 04/02/2019) 30 tablet 1   No current facility-administered medications on file prior to visit.    ROS as in subjective   Objective: BP 138/86   Pulse 67   Temp 98 F (36.7 C)   Ht 5' 9.5" (1.765 m)   Wt 175 lb 3.2 oz (79.5 kg)   SpO2 97%   BMI  25.50 kg/m   Wt Readings from Last 3 Encounters:  07/06/19 175 lb 3.2 oz (79.5 kg)  04/02/19 179 lb 6.4 oz (81.4 kg)  02/03/19 176 lb 12.8 oz (80.2 kg)   Gen: wd, wn, nad Skin: unremarkable MSK: Bilateral shoulders tender over AC joint and biceps origin, mild pain with flexion over 90 degrees, mild pain with abduction over 90 degrees, range of motion seems relatively full, no laxity, mild pain with crossover test on the right, Hawkins pain on the right, otherwise no other tenderness noted, no swelling, no deformity Neck: supple, no mass, no lymphadenopathy, no thyromegaly, mild bilat posterolateral neck tenderness, no midline posterior tenderness Upper back paraspinal tenderness and supraspinatus tenderness, no mid and lower back or midline tenderness, no deformity, back ROM normal Arms neurovascularly intact Abdomen: lower abdomen nontender, no mass, no organomegaly    Assessment: Encounter Diagnoses  Name Primary?  . Neck pain Yes  . Bilateral shoulder pain, unspecified chronicity   . Hematuria, unspecified type   . Lower abdominal pain   . Screen for STD (sexually transmitted disease)      Plan: Neck pain, shoulder pain, suggestive of shoulder tendinitis from repetitive activity  more likely than other pathology.  Discussed other differential.  Referral to physical therapy   hematuria, lower abdominal pain --reviewed urinalysis, screen for STD, labs today, follow-up pending labs.  His symptoms resolved as of today but will follow-up pending labs  Jorge Walker was seen today for shoulder pain and neck pain.  Diagnoses and all orders for this visit:  Neck pain -     Ambulatory referral to Physical Therapy  Bilateral shoulder pain, unspecified chronicity -     Ambulatory referral to Physical Therapy  Hematuria, unspecified type -     HIV regular -     RPR regular -     GC/Chlamydia Probe Amp -     Urine culture  Lower abdominal pain -     HIV regular -     RPR regular -      GC/Chlamydia Probe Amp -     Urine culture  Screen for STD (sexually transmitted disease) -     HIV regular -     RPR regular -     GC/Chlamydia Probe Amp -     Urine culture   F/u pending labs

## 2019-07-07 LAB — HIV ANTIBODY (ROUTINE TESTING W REFLEX): HIV Screen 4th Generation wRfx: NONREACTIVE

## 2019-07-07 LAB — RPR: RPR Ser Ql: NONREACTIVE

## 2019-07-08 ENCOUNTER — Other Ambulatory Visit: Payer: Self-pay | Admitting: Medical

## 2019-07-08 LAB — GC/CHLAMYDIA PROBE AMP
Chlamydia trachomatis, NAA: NEGATIVE
Neisseria Gonorrhoeae by PCR: NEGATIVE

## 2019-07-08 LAB — URINE CULTURE

## 2019-07-08 MED ORDER — SULFAMETHOXAZOLE-TRIMETHOPRIM 800-160 MG PO TABS: 1.0000 | ORAL_TABLET | Freq: Two times a day (BID) | ORAL | 0 refills | Status: DC

## 2019-07-12 ENCOUNTER — Ambulatory Visit: Payer: Managed Care, Other (non HMO) | Admitting: Medical

## 2019-07-26 ENCOUNTER — Other Ambulatory Visit: Payer: Self-pay | Admitting: Medical

## 2019-07-26 ENCOUNTER — Other Ambulatory Visit: Payer: Self-pay

## 2019-07-26 ENCOUNTER — Other Ambulatory Visit (INDEPENDENT_AMBULATORY_CARE_PROVIDER_SITE_OTHER): Payer: Managed Care, Other (non HMO)

## 2019-07-26 DIAGNOSIS — D649 Anemia, unspecified: Secondary | ICD-10-CM

## 2019-07-26 DIAGNOSIS — N400 Enlarged prostate without lower urinary tract symptoms: Secondary | ICD-10-CM

## 2019-07-27 ENCOUNTER — Telehealth: Payer: Self-pay | Admitting: Medical

## 2019-07-27 LAB — CBC
Hematocrit: 39.6 % (ref 37.5–51.0)
Hemoglobin: 12.9 g/dL — ABNORMAL LOW (ref 13.0–17.7)
MCH: 27.8 pg (ref 26.6–33.0)
MCHC: 32.6 g/dL (ref 31.5–35.7)
MCV: 85 fL (ref 79–97)
Platelets: 222 10*3/uL (ref 150–450)
RBC: 4.64 x10E6/uL (ref 4.14–5.80)
RDW: 13.4 % (ref 11.6–15.4)
WBC: 6.9 10*3/uL (ref 3.4–10.8)

## 2019-07-27 LAB — PSA: Prostate Specific Ag, Serum: 5.3 ng/mL — ABNORMAL HIGH (ref 0.0–4.0)

## 2019-07-27 NOTE — Telephone Encounter (Signed)
Called and left pt vm to call the office to schedule visit to discuss labs

## 2019-07-29 ENCOUNTER — Other Ambulatory Visit: Payer: Self-pay

## 2019-07-29 ENCOUNTER — Encounter: Payer: Self-pay | Admitting: Physical Therapy

## 2019-07-29 ENCOUNTER — Ambulatory Visit: Payer: Managed Care, Other (non HMO) | Attending: Medical | Admitting: Physical Therapy

## 2019-07-29 DIAGNOSIS — M25512 Pain in left shoulder: Secondary | ICD-10-CM | POA: Insufficient documentation

## 2019-07-29 DIAGNOSIS — G8929 Other chronic pain: Secondary | ICD-10-CM | POA: Diagnosis present

## 2019-07-29 DIAGNOSIS — M25511 Pain in right shoulder: Secondary | ICD-10-CM | POA: Insufficient documentation

## 2019-07-29 DIAGNOSIS — M6281 Muscle weakness (generalized): Secondary | ICD-10-CM | POA: Insufficient documentation

## 2019-07-29 DIAGNOSIS — M542 Cervicalgia: Secondary | ICD-10-CM | POA: Insufficient documentation

## 2019-07-29 NOTE — Patient Instructions (Signed)
Access Code: [email or web address]  URL: [email or web address]  Date: 07/29/2019  Prepared by: Hilda Blades   Exercises Banded Row - 10 reps - 2 sets - 4x weekly Shoulder External Rotation and Scapular Retraction with Resistance - 10 reps - 2 sets - 4x weekly Standing Shoulder Horizontal Abduction with Resistance - 10 reps - 2 sets - 1x daily - 4x weekly Prone I - 10 reps - 3 seconds hold - 1x daily - 4x weekly Prone T - 10 reps - 3 seconds hold - 1x daily - 4x weekly Prone Y - 10 reps - 3 seconds hold - 1x daily - 4x weekly

## 2019-07-29 NOTE — Therapy (Signed)
[facility], [place], [address] Phone: [phone]   Fax:  [phone]  Physical Therapy Evaluation  Patient Details  Name: Jorge Walker MRN: [id number] Date of Birth: 04-25-1966 Referring Provider (PT): Glade Lloyd Camelia Eng, PA-C   Encounter Date: 07/29/2019  PT End of Session - 07/29/19 1354    Visit Number  1    Number of Visits  6    Date for PT Re-Evaluation  09/23/19    Authorization Type  [employer]    PT Start Time  [id number]    PT Stop Time  1442    PT Time Calculation (min)  44 min    Activity Tolerance  Patient tolerated treatment well    Behavior During Therapy  [facility] for tasks assessed/performed       Past Medical History:  Diagnosis Date  . Allergy   . Arthritis    finger  . BPH (benign prostatic hypertrophy)   . Epididymal cyst   . Tobacco use disorder     Past Surgical History:  Procedure Laterality Date  . ARM WOUND REPAIR / CLOSURE     right upper posterior arm - teenager, s/p trauma  . ARTERY REPAIR     right upper arm - teenager, s/p trauma to right upper arm  . COLONOSCOPY  01/24/2016   Diverticulosis in left and right colon.   Repeat 10 years.  Dr. Scarlette Shorts  . ELBOW SURGERY  2019    There were no vitals filed for this visit.   Subjective Assessment - 07/29/19 1353    Subjective  Patient reports bilateral shoulder pain. This has been going on since about the middle of October. He believes this began at work. Occurred with mechanism of injury. When pain first began he also had some neck pain but that has resolved. At work he works from [employer] where he has to "jog the paper," cut the paper, and stack the paper. He has pain while at work and he is unable to lay on his side when trying to sleep.    Pertinent History  Right elbow surgery 2019,    How long can you sit comfortably?  No limitation    How long can you stand comfortably?  No limitation    How long can you  walk comfortably?  No limitation    Diagnostic tests  None    Patient Stated Goals  Reduce pain to improve ability to work and sleep    Currently in Pain?  Yes    Pain Score  5     Pain Location  Shoulder    Pain Orientation  Right;Left    Pain Descriptors / Indicators  Aching    Pain Type  Chronic pain    Pain Radiating Towards  numbness left arm    Pain Onset  More than a month ago    Pain Frequency  Intermittent    Aggravating Factors   Lifting, reaching behind back    Pain Relieving Factors  Rest    Effect of Pain on Daily Activities  Patient is limited in lifting at work and lying on his side.         [facility] PT Assessment - 07/29/19 0001      Assessment   Medical Diagnosis  Neck and bilateral shoulder pain    Referring Provider (PT)  Tysinger, Leward Quan    Next MD Visit  07/30/2019    Prior Therapy  No  Precautions   Precautions  None      Restrictions   Weight Bearing Restrictions  No      Balance Screen   Has the patient fallen in the past 6 months  No    Has the patient had a decrease in activity level because of a fear of falling?   No    Is the patient reluctant to leave their home because of a fear of falling?   No      Prior Function   Level of Independence  Independent      Cognition   Overall Cognitive Status  Within Functional Limits for tasks assessed      Observation/Other Assessments   Focus on Therapeutic Outcomes (FOTO)   43% limitation      Observation/Other Assessments-Edema    Edema  --   None noted     Sensation   Light Touch  Appears Intact      Posture/Postural Control   Posture Comments  Patient exhibits a moderate rounded shoulder and forward head posture, inferior angle winging at rest      ROM / Strength   AROM / PROM / Strength  AROM;Strength      AROM   AROM Assessment Site  Shoulder    Right/Left Shoulder  Right;Left    Right Shoulder Flexion  160 Degrees    Right Shoulder ABduction  160 Degrees    Right Shoulder  Internal Rotation  --   T8   Right Shoulder External Rotation  70 Degrees   T4   Left Shoulder Flexion  160 Degrees    Left Shoulder ABduction  160 Degrees    Left Shoulder Internal Rotation  --   T6   Left Shoulder External Rotation  70 Degrees   T4     Strength   Overall Strength Comments  Periscapular musculature grossly 4-/5    Strength Assessment Site  Shoulder    Right/Left Shoulder  Right;Left    Right Shoulder Flexion  4/5    Right Shoulder Extension  4/5    Right Shoulder ABduction  4/5    Right Shoulder Internal Rotation  5/5    Right Shoulder External Rotation  4/5    Right Shoulder Horizontal ABduction  4/5    Left Shoulder Flexion  4/5    Left Shoulder Extension  4/5    Left Shoulder ABduction  4/5    Left Shoulder Internal Rotation  5/5    Left Shoulder External Rotation  4/5    Left Shoulder Horizontal ABduction  4/5                Objective measurements completed on examination: See above findings.      [facility] Adult PT Treatment/Exercise - 07/29/19 0001      Exercises   Exercises  Shoulder      Shoulder Exercises: Prone   Other Prone Exercises  Prone I, T, Y x10 2 sec hold each      Shoulder Exercises: Standing   Horizontal ABduction  10 reps;Theraband    Theraband Level (Shoulder Horizontal ABduction)  Level 2 (Red)    External Rotation  10 reps;Theraband    Theraband Level (Shoulder External Rotation)  Level 2 (Red)    External Rotation Limitations  double    Row  10 reps;Theraband    Theraband Level (Shoulder Row)  Level 4 [facility])             PT Education - 07/29/19 1355  Education Details  Exam findings, anatomy of condition, POC, HEP    Person(s) Educated  Patient    Methods  Explanation;Demonstration;Verbal cues;Handout    Comprehension  Verbalized understanding;Returned demonstration;Verbal cues required;Need further instruction       PT Short Term Goals - 07/29/19 1756      PT SHORT TERM GOAL #1   Title  Patient  will be independent with HEP to maintain progress in PT    Time  4    Period  Weeks    Status  New    Target Date  08/26/19      PT SHORT TERM GOAL #2   Title  Patient will report < or = 2-3/10 bilateral shoulder pain while at work.    Time  4    Period  Weeks    Status  New    Target Date  08/26/19      PT SHORT TERM GOAL #3   Title  Patient will exhibit improved posture to reduce stress on shoulders/neck and improve lifting ability    Time  4    Period  Weeks    Status  New    Target Date  08/26/19        PT Long Term Goals - 07/29/19 1759      PT LONG TERM GOAL #1   Title  Patient will exhibit gross UE strength 4+/5 to improve lifting ability and tolerance at work    Time  8    Period  Weeks    Status  New    Target Date  09/23/19      PT LONG TERM GOAL #2   Title  Patient will report < or = 1-2/10 pain with lifting at work.    Time  8    Period  Weeks    Status  New    Target Date  09/23/19      PT LONG TERM GOAL #3   Title  Patient will report no limitation with lying on his side to improve sleeping.    Time  8    Period  Weeks    Status  New    Target Date  09/23/19      PT LONG TERM GOAL #4   Title  Patient will report improved function of FOTO < or = 28% limitation    Time  8    Period  Weeks    Status  New    Target Date  09/23/19             Plan - 07/29/19 1609    Clinical Impression Statement  Patient presents to PT with report of chronic bilateral shoulder pain that he attributes to the repetitive nature of lifting at his job. His symptoms are consistent with bilateral rotator cuff tendinopathy with possible cervical involvement as he was reporting numbness into left arm. His cervical screen was negative other than some muscular tightness and this will be addressed in future visits. He was provided exercises to address periscapular and rotator cuff strengh deficits with focus on proper posture. He would benefit from continued skilled PT to  progress his strength and control to reduce pain with lifting at work.    Personal Factors and Comorbidities  Profession    Examination-Activity Limitations  Lift;Sleep    Examination-Participation Restrictions  Other   Job duties   Stability/Clinical Decision Making  Stable/Uncomplicated    Clinical Decision Making  Low    Rehab Potential  Good  PT Frequency  1x / week   will begin in 2 weeks   PT Duration  8 weeks    PT Treatment/Interventions  ADLs/Self Care Home Management;Cryotherapy;Electrical Stimulation;Moist Heat;Iontophoresis 4mg /ml Dexamethasone;Ultrasound;Therapeutic exercise;Therapeutic activities;Neuromuscular re-education;Patient/family education;Dry needling;Passive range of motion;Manual techniques;Taping;Spinal Manipulations;Joint Manipulations    PT Next Visit Plan  Assess HEP, assess cervical spine in more depth if left arm numbness persists; progress periscapular and rotator cuff strength and control    PT Home Exercise Plan  Prone I, T, Y; Banded row with blue, double ER with red, standing horiz abd with red    Consulted and Agree with Plan of Care  Patient       Patient will benefit from skilled therapeutic intervention in order to improve the following deficits and impairments:  Decreased activity tolerance, Decreased strength, Impaired flexibility, Postural dysfunction, Pain  Visit Diagnosis: Chronic right shoulder pain  Chronic left shoulder pain  Cervicalgia  Muscle weakness (generalized)     Problem List Patient Active Problem List   Diagnosis Date Noted  . Elevated PSA 04/02/2019  . Drop in hemoglobin 04/02/2019  . Vaccine counseling 02/03/2019  . Abnormal EKG 11/28/2017  . Polyarthralgia 11/28/2017  . Encounter for health maintenance examination in adult 01/02/2016  . Special screening for malignant neoplasms, colon 01/02/2016  . Screening for prostate cancer 01/02/2016  . Benign prostatic hyperplasia 01/02/2016  . Nocturia 01/02/2016  .  Lipoma of extremity 01/02/2016  . Epididymal cyst 07/07/2008  . TOBACCO ABUSE 04/28/2008    Hilda Blades, PT, DPT, LAT, ATC 07/29/19  6:04 PM Phone: [phone] Fax: [facility] [address] [place], [place], [address] Phone: [phone]   Fax:  [phone]  Name: RANDOL BERMEL MRN: [id number] Date of Birth: [birth year]

## 2019-07-30 ENCOUNTER — Encounter: Payer: Self-pay | Admitting: Medical

## 2019-07-30 ENCOUNTER — Ambulatory Visit (INDEPENDENT_AMBULATORY_CARE_PROVIDER_SITE_OTHER): Payer: Managed Care, Other (non HMO) | Admitting: Medical

## 2019-07-30 VITALS — BP 148/82 | HR 56 | Temp 98.0°F | Ht 70.0 in | Wt 173.6 lb

## 2019-07-30 DIAGNOSIS — R972 Elevated prostate specific antigen [PSA]: Secondary | ICD-10-CM | POA: Diagnosis not present

## 2019-07-30 DIAGNOSIS — R030 Elevated blood-pressure reading, without diagnosis of hypertension: Secondary | ICD-10-CM

## 2019-07-30 DIAGNOSIS — R799 Abnormal finding of blood chemistry, unspecified: Secondary | ICD-10-CM | POA: Diagnosis not present

## 2019-07-30 DIAGNOSIS — N419 Inflammatory disease of prostate, unspecified: Secondary | ICD-10-CM | POA: Diagnosis not present

## 2019-07-30 DIAGNOSIS — F17200 Nicotine dependence, unspecified, uncomplicated: Secondary | ICD-10-CM

## 2019-07-30 DIAGNOSIS — R310 Gross hematuria: Secondary | ICD-10-CM

## 2019-07-30 NOTE — Patient Instructions (Signed)
Recommendations: Return in mid to late January for nurse visit to recheck blood, urine and blood pressure  We will be checking PSA lab, CBC blood count, clean catch urine, and BP check   Your recent PSA was elevated likely due to infection, but PSA can be elevated for other reasons.  Thus we need to recheck this in january to make sure it returns to your baseline of 3.7.   Your blood count showed mild anemia, we want recheck this at next visit as well as urine as you had blood in the urine presumably from infection.  Your blood pressure has been elevated at recent visits.   Lets recheck this in January.   Quit smoking     Prostate Cancer Screening  The prostate is a walnut-sized gland that is located below the bladder and in front of the rectum in males. The function of the prostate (prostate gland) is to add fluid to semen during ejaculation. Prostate cancer is the second most common type of cancer in men. A screening test for cancer is a test that is done before cancer symptoms start. Screening can help to identify cancer at an early stage, when the cancer can be treated more easily. The recommended prostate cancer screening test is a blood test called the prostate-specific antigen (PSA) test. PSA is a protein that is made in the prostate. As you age, your prostate naturally produces more PSA. Abnormally high PSA levels may be caused by:  Prostate cancer.  An enlarged prostate that is not caused by cancer (benign prostatic hyperplasia, BPH). This condition is very common in older men.  A prostate gland infection (prostatitis).  Medicines to assist with hair growth, such as finasteride. Depending on the PSA results, you may need more tests, such as:  A physical exam to check the size of your prostate gland.  Blood and imaging tests.  A procedure to remove tissue samples from your prostate gland for testing (biopsy). Who should have screening? Screening recommendations vary  based on age.  If you are younger than age 18, screening is not recommended.  If you are age 32-54 and you have no risk factors, screening is not recommended.  If you are younger than age 2, ask your health care provider if you need screening if you have one of these risk factors: ? Being of African-American descent. ? Having a family history of prostate cancer.  If you are age 33-69, talk with your health care provider about your need for screening and how often screening should be done.  If you are older than age 70, screening is not recommended. This is because the risks that screening can cause are greater than the benefits that it may provide (risks outweigh the benefits). If you are at high risk for prostate cancer, your health care provider may recommend that you have screenings more often or start screening at a younger age. You may be at high risk if you:  Are older than age 28.  Are African-American.  Have a father, brother, or uncle who has been diagnosed with prostate cancer. The risk may be higher if your family member's cancer occurred at an early age. What are the benefits of screening? There is a small chance that screening may lower your risk of dying from prostate cancer. The chance is small because prostate cancer is typically a slow-growing cancer, and most men with prostate cancer die from a different cause. What are the risks of screening? The main risk of  prostate cancer screening is diagnosing and treating prostate cancer that would never have caused any symptoms or problems (overdiagnosis and overtreatment). PSA screening cannot tell you if your PSA is high due to cancer or a different cause. A prostate biopsy is the only procedure to diagnose prostate cancer. Even the results of a biopsy may not tell you if your cancer needs to be treated. Slow-growing prostate cancer may not need any treatment other than monitoring, so diagnosing and treating it may cause  unnecessary stress or other side effects. A prostate biopsy may also cause:  Infection or fever.  A false negative. This is a result that shows that you do not have prostate cancer when you actually do have prostate cancer. Questions to ask your health care provider  When should I start prostate cancer screening?  What is my risk for prostate cancer?  How often do I need screening?  What type of screening tests do I need?  How do I get my test results?  What do my results mean?  Do I need treatment? Contact a health care provider if:  You have difficulty urinating.  You have pain when you urinate or ejaculate.  You have blood in your urine or semen.  You have pain in your back or in the area of your prostate.  You have trouble getting or maintaining an erection (erectile dysfunction, ED). Summary  Prostate cancer is a common type of cancer in men. The prostate (prostate gland) is located below the bladder and in front of the rectum. This gland adds fluid to semen during ejaculation.  Prostate cancer screening may identify cancer at an early stage, when the cancer can be treated more easily.  The prostate-specific antigen (PSA) test is the recommended screening test for prostate cancer.  Discuss the risks and benefits of prostate cancer screening with your health care provider. If you are age 94 or older, screening is likely to lead to more risks than benefits (risks outweigh the benefits). This information is not intended to replace advice given to you by your health care provider. Make sure you discuss any questions you have with your health care provider. Document Released: 05/23/2017 Document Revised: 07/25/2017 Document Reviewed: 05/23/2017 Elsevier Patient Education  2020 [employer].     Hypertension, Adult Hypertension is another name for high blood pressure. High blood pressure forces your heart to work harder to pump blood. This can cause problems over  time. There are two numbers in a blood pressure reading. There is a top number (systolic) over a bottom number (diastolic). It is best to have a blood pressure that is below 120/80. Healthy choices can help lower your blood pressure, or you may need medicine to help lower it. What are the causes? The cause of this condition is not known. Some conditions may be related to high blood pressure. What increases the risk?  Smoking.  Having type 2 diabetes mellitus, high cholesterol, or both.  Not getting enough exercise or physical activity.  Being overweight.  Having too much fat, sugar, calories, or salt (sodium) in your diet.  Drinking too much alcohol.  Having long-term (chronic) kidney disease.  Having a family history of high blood pressure.  Age. Risk increases with age.  Race. You may be at higher risk if you are African American.  Gender. Men are at higher risk than women before age 14. After age 65, women are at higher risk than men.  Having obstructive sleep apnea.  Stress. What are  the signs or symptoms?  High blood pressure may not cause symptoms. Very high blood pressure (hypertensive crisis) may cause: ? Headache. ? Feelings of worry or nervousness (anxiety). ? Shortness of breath. ? Nosebleed. ? A feeling of being sick to your stomach (nausea). ? Throwing up (vomiting). ? Changes in how you see. ? Very bad chest pain. ? Seizures. How is this treated?  This condition is treated by making healthy lifestyle changes, such as: ? Eating healthy foods. ? Exercising more. ? Drinking less alcohol.  Your health care provider may prescribe medicine if lifestyle changes are not enough to get your blood pressure under control, and if: ? Your top number is above 130. ? Your bottom number is above 80.  Your personal target blood pressure may vary. Follow these instructions at home: Eating and drinking   If told, follow the DASH eating plan. To follow this  plan: ? Fill one half of your plate at each meal with fruits and vegetables. ? Fill one fourth of your plate at each meal with whole grains. Whole grains include whole-wheat pasta, brown rice, and whole-grain bread. ? Eat or drink low-fat dairy products, such as skim milk or low-fat yogurt. ? Fill one fourth of your plate at each meal with low-fat (lean) proteins. Low-fat proteins include fish, chicken without skin, eggs, beans, and tofu. ? Avoid fatty meat, cured and processed meat, or chicken with skin. ? Avoid pre-made or processed food.  Eat less than 1,500 mg of salt each day.  Do not drink alcohol if: ? Your doctor tells you not to drink. ? You are pregnant, may be pregnant, or are planning to become pregnant.  If you drink alcohol: ? Limit how much you use to:  0-1 drink a day for women.  0-2 drinks a day for men. ? Be aware of how much alcohol is in your drink. In the [place] one drink equals one 12 oz bottle of beer (355 mL), one 5 oz glass of wine (148 mL), or one 1 oz glass of hard liquor (44 mL). Lifestyle   Work with your doctor to stay at a healthy weight or to lose weight. Ask your doctor what the best weight is for you.  Get at least 30 minutes of exercise most days of the week. This may include walking, swimming, or biking.  Get at least 30 minutes of exercise that strengthens your muscles (resistance exercise) at least 3 days a week. This may include lifting weights or doing Pilates.  Do not use any products that contain nicotine or tobacco, such as cigarettes, e-cigarettes, and chewing tobacco. If you need help quitting, ask your doctor.  Check your blood pressure at home as told by your doctor.  Keep all follow-up visits as told by your doctor. This is important. Medicines  Take over-the-counter and prescription medicines only as told by your doctor. Follow directions carefully.  Do not skip doses of blood pressure medicine. The medicine does not work as well  if you skip doses. Skipping doses also puts you at risk for problems.  Ask your doctor about side effects or reactions to medicines that you should watch for. Contact a doctor if you:  Think you are having a reaction to the medicine you are taking.  Have headaches that keep coming back (recurring).  Feel dizzy.  Have swelling in your ankles.  Have trouble with your vision. Get help right away if you:  Get a very bad headache.  Start to  feel mixed up (confused).  Feel weak or numb.  Feel faint.  Have very bad pain in your: ? Chest. ? Belly (abdomen).  Throw up more than once.  Have trouble breathing. Summary  Hypertension is another name for high blood pressure.  High blood pressure forces your heart to work harder to pump blood.  For most people, a normal blood pressure is less than 120/80.  Making healthy choices can help lower blood pressure. If your blood pressure does not get lower with healthy choices, you may need to take medicine. This information is not intended to replace advice given to you by your health care provider. Make sure you discuss any questions you have with your health care provider. Document Released: 01/29/2008 Document Revised: 04/22/2018 Document Reviewed: 04/22/2018 Elsevier Patient Education  2020 [employer].

## 2019-07-30 NOTE — Progress Notes (Signed)
Subjective:  Jorge Walker is a 53 y.o. male who presents for Chief Complaint  Patient presents with  . Labs Only    discuss labs      Here for follow-up from recent visit for hematuria, pelvic pain, assume prostate infection.  He is symptom-free at this point He has had no more hematuria, no fever, no dysuria, no pelvic pain.  He is a smoker  He is active on the job with walking.  He just restarted physical therapy regarding his neck last visit  No other aggravating or relieving factors.    No other c/o.  Past Medical History:  Diagnosis Date  . Allergy   . Arthritis    finger  . BPH (benign prostatic hypertrophy)   . Epididymal cyst   . Tobacco use disorder    Current Outpatient Medications on File Prior to Visit  Medication Sig Dispense Refill  . ferrous gluconate (FERGON) 324 MG tablet Take 1 tablet (324 mg total) by mouth daily with breakfast. (Patient not taking: Reported on 07/06/2019) 30 tablet 2  . finasteride (PROSCAR) 5 MG tablet Take 1 tablet (5 mg total) by mouth daily. (Patient not taking: Reported on 04/02/2019) 30 tablet 1  . sulfamethoxazole-trimethoprim (BACTRIM DS) 800-160 MG tablet Take 1 tablet by mouth 2 (two) times daily. (Patient not taking: Reported on 07/30/2019) 28 tablet 0   No current facility-administered medications on file prior to visit.     The following portions of the patient's history were reviewed and updated as appropriate: allergies, current medications, past family history, past medical history, past social history, past surgical history and problem list.  ROS Otherwise as in subjective above   Objective: BP (!) 148/82   Pulse (!) 56   Temp 98 F (36.7 C)   Ht 5\' 10"  (1.778 m)   Wt 173 lb 9.6 oz (78.7 kg)   SpO2 100%   BMI 24.91 kg/m   General appearance: alert, no distress, well developed, well nourished, African American male Abdomen: +bs, soft, non tender, non distended, no masses, no hepatomegaly, no  splenomegaly   Assessment: Encounter Diagnoses  Name Primary?  . Elevated PSA Yes  . Abnormal blood cell count   . Gross hematuria   . Prostatitis, unspecified prostatitis type   . Smoker   . Elevated blood-pressure reading without diagnosis of hypertension      Plan: We discussed his recent visit and labs which included slightly low hemoglobin, elevated PSA in the 5 range from his baseline of 3.7.  Since last visit and being on the round of Bactrim his symptoms have resolved.  I advised he quit smoking.  Counseled on smoking cessation.  He does not seem ready to quit.  We will see him back in mid January for clean-catch urine, PSA lab, CBC and blood pressure check.  If PSA continues to be elevated despite asymptomatic we will refer to urology at that point  I suspect the recent slight drop in hemoglobin was from the hematuria  Jorge Walker was seen today for labs only.  Diagnoses and all orders for this visit:  Elevated PSA -     PSA, total and free; Future -     POCT urinalysis dipstick; Future  Abnormal blood cell count -     CBC with Differential; Future -     POCT urinalysis dipstick; Future  Gross hematuria -     CBC with Differential; Future -     POCT urinalysis dipstick; Future  Prostatitis, unspecified  prostatitis type -     POCT urinalysis dipstick; Future  Smoker -     POCT urinalysis dipstick; Future  Elevated blood-pressure reading without diagnosis of hypertension    Follow up: mid January 2021 for nurse visit  Labs, clean catch UA, and BP check

## 2019-08-12 ENCOUNTER — Encounter: Payer: Self-pay | Admitting: Physical Therapy

## 2019-08-12 ENCOUNTER — Ambulatory Visit: Payer: Managed Care, Other (non HMO) | Admitting: Physical Therapy

## 2019-08-12 ENCOUNTER — Other Ambulatory Visit: Payer: Self-pay

## 2019-08-12 DIAGNOSIS — M25511 Pain in right shoulder: Secondary | ICD-10-CM | POA: Diagnosis not present

## 2019-08-12 DIAGNOSIS — M542 Cervicalgia: Secondary | ICD-10-CM

## 2019-08-12 DIAGNOSIS — M25512 Pain in left shoulder: Secondary | ICD-10-CM

## 2019-08-12 DIAGNOSIS — G8929 Other chronic pain: Secondary | ICD-10-CM

## 2019-08-12 DIAGNOSIS — M6281 Muscle weakness (generalized): Secondary | ICD-10-CM

## 2019-08-12 NOTE — Patient Instructions (Signed)
Access Code: [email or web address]  URL: [email or web address]  Date: 08/12/2019  Prepared by: Hilda Blades   Exercises Banded Row - 10 reps - 2 sets - 1x daily - 7x weekly Shoulder Extension with Band - 10 reps - 2 sets - 1x daily - 7x weekly Shoulder External Rotation with Anchored Resistance - 10 reps - 2 sets - 1x daily - 7x weekly Shoulder Internal Rotation with Resistance - 10 reps - 2 sets - 1x daily - 7x weekly Seated Upper Trapezius Stretch - 3 reps - 20 seconds hold - 1x daily - 7x weekly Gentle Levator Scapulae Stretch - 3 reps - 20 seconds hold - 1x daily - 7x weekly Seated Cervical Retraction - 10 reps - 3 seconds hold - 1x daily - 7x weekly

## 2019-08-12 NOTE — Therapy (Signed)
[place], [place], [address] Phone: [phone]   Fax:  [phone]  Physical Therapy Treatment  Patient Details  Name: Jorge Walker MRN: [id number] Date of Birth: 10-20-1965 Referring Provider (PT): Glade Lloyd Camelia Eng, PA-C   Encounter Date: 08/12/2019  PT End of Session - 08/12/19 1358    Visit Number  2    Number of Visits  6    Date for PT Re-Evaluation  09/23/19    Authorization Type  [employer]    PT Start Time  1400    PT Stop Time  1445    PT Time Calculation (min)  45 min    Activity Tolerance  Patient tolerated treatment well    Behavior During Therapy  [facility] for tasks assessed/performed       Past Medical History:  Diagnosis Date  . Allergy   . Arthritis    finger  . BPH (benign prostatic hypertrophy)   . Epididymal cyst   . Tobacco use disorder     Past Surgical History:  Procedure Laterality Date  . ARM WOUND REPAIR / CLOSURE     right upper posterior arm - teenager, s/p trauma  . ARTERY REPAIR     right upper arm - teenager, s/p trauma to right upper arm  . COLONOSCOPY  01/24/2016   Diverticulosis in left and right colon.   Repeat 10 years.  Dr. Scarlette Shorts  . ELBOW SURGERY  2019    There were no vitals filed for this visit.  Subjective Assessment - 08/12/19 1357    Subjective  Patient reports today was his last day at work was today. He continues to have the same pain in both shoulders and numbness from the left elbow to hand. Right shoulder is worse than left.    Currently in Pain?  Yes    Pain Location  Shoulder    Pain Orientation  Right;Left    Pain Descriptors / Indicators  Aching    Pain Type  Chronic pain    Pain Onset  More than a month ago    Pain Frequency  Intermittent                       [facility] Adult PT Treatment/Exercise - 08/12/19 0001      Exercises   Exercises  Shoulder;Neck      Neck Exercises: Seated   Neck Retraction  10 reps;3  secs    Neck Retraction Limitations  Cued for form and posture      Shoulder Exercises: Standing   External Rotation  10 reps;Theraband   2 sets   Theraband Level (Shoulder External Rotation)  Level 2 (Red)    Internal Rotation  10 reps;Theraband   2 sets   Theraband Level (Shoulder Internal Rotation)  Level 2 (Red)    Extension  10 reps;Theraband   2 sets   Theraband Level (Shoulder Extension)  Level 2 (Red)    Row  10 reps;Theraband   2 sets   Theraband Level (Shoulder Row)  Level 3 (Green)      Shoulder Exercises: ROM/Strengthening   UBE (Upper Arm Bike)  L1 x4 min (2 fwd/2 bwd)      Neck Exercises: Stretches   Upper Trapezius Stretch  2 reps;20 seconds    Levator Stretch  2 reps;20 seconds             PT Education - 08/12/19 1358    Education Details  HEP    Person(s) Educated  Patient    Methods  Explanation;Demonstration;Verbal cues;Handout    Comprehension  Verbalized understanding;Returned demonstration;Verbal cues required;Need further instruction       PT Short Term Goals - 07/29/19 1756      PT SHORT TERM GOAL #1   Title  Patient will be independent with HEP to maintain progress in PT    Time  4    Period  Weeks    Status  New    Target Date  08/26/19      PT SHORT TERM GOAL #2   Title  Patient will report < or = 2-3/10 bilateral shoulder pain while at work.    Time  4    Period  Weeks    Status  New    Target Date  08/26/19      PT SHORT TERM GOAL #3   Title  Patient will exhibit improved posture to reduce stress on shoulders/neck and improve lifting ability    Time  4    Period  Weeks    Status  New    Target Date  08/26/19        PT Long Term Goals - 07/29/19 1759      PT LONG TERM GOAL #1   Title  Patient will exhibit gross UE strength 4+/5 to improve lifting ability and tolerance at work    Time  8    Period  Weeks    Status  New    Target Date  09/23/19      PT LONG TERM GOAL #2   Title  Patient will report < or = 1-2/10  pain with lifting at work.    Time  8    Period  Weeks    Status  New    Target Date  09/23/19      PT LONG TERM GOAL #3   Title  Patient will report no limitation with lying on his side to improve sleeping.    Time  8    Period  Weeks    Status  New    Target Date  09/23/19      PT LONG TERM GOAL #4   Title  Patient will report improved function of FOTO < or = 28% limitation    Time  8    Period  Weeks    Status  New    Target Date  09/23/19            Plan - 08/12/19 1358    Clinical Impression Statement  Patient continues to have bilateral shoulder pain that is consistent with rotator cuff tendinopathy with left lower arm numbness. His numbness is not reproduced with cervical motion or testing, but he did exhibit upper trap/levator tighness. He tolerated therapy well this visit and his HEP was adjusted to reduce stress on shoulders and incorporate postural control and stretching. Now that he will be changing job, hopefully his shoulder pain will reduce since he will be lifting and moving heavy boxes less. He would benefit from continued skilled PT to progress rotator cuff tolerance and postural control.    PT Treatment/Interventions  ADLs/Self Care Home Management;Cryotherapy;Electrical Stimulation;Moist Heat;Iontophoresis 4mg /ml Dexamethasone;Ultrasound;Therapeutic exercise;Therapeutic activities;Neuromuscular re-education;Patient/family education;Dry needling;Passive range of motion;Manual techniques;Taping;Spinal Manipulations;Joint Manipulations    PT Next Visit Plan  Assess HEP, assess cervical spine in more depth if left arm numbness persists; progress periscapular and rotator cuff strength and control    PT Home Exercise Plan  Banded row  with green, extension with red, ER/IR with red; seated upper trap and levator stretching, seated chin tuck    Consulted and Agree with Plan of Care  Patient       Patient will benefit from skilled therapeutic intervention in order to  improve the following deficits and impairments:  Decreased activity tolerance, Decreased strength, Impaired flexibility, Postural dysfunction, Pain  Visit Diagnosis: Chronic right shoulder pain  Chronic left shoulder pain  Cervicalgia  Muscle weakness (generalized)     Problem List Patient Active Problem List   Diagnosis Date Noted  . Abnormal blood cell count 07/30/2019  . Gross hematuria 07/30/2019  . Prostatitis 07/30/2019  . Elevated blood-pressure reading without diagnosis of hypertension 07/30/2019  . Elevated PSA 04/02/2019  . Drop in hemoglobin 04/02/2019  . Vaccine counseling 02/03/2019  . Abnormal EKG 11/28/2017  . Polyarthralgia 11/28/2017  . Encounter for health maintenance examination in adult 01/02/2016  . Special screening for malignant neoplasms, colon 01/02/2016  . Screening for prostate cancer 01/02/2016  . Benign prostatic hyperplasia 01/02/2016  . Nocturia 01/02/2016  . Lipoma of extremity 01/02/2016  . Epididymal cyst 07/07/2008  . Smoker 04/28/2008    Hilda Blades, PT, DPT, LAT, ATC 08/12/19  2:56 PM Phone: [phone] Fax: [facility] [address] [place], [place], [address] Phone: [phone]   Fax:  [phone]  Name: Jorge Walker MRN: [id number] Date of Birth: [birth year]

## 2019-08-17 ENCOUNTER — Ambulatory Visit: Payer: Managed Care, Other (non HMO) | Admitting: Physical Therapy

## 2019-08-17 ENCOUNTER — Encounter: Payer: Self-pay | Admitting: Physical Therapy

## 2019-08-17 ENCOUNTER — Other Ambulatory Visit: Payer: Self-pay

## 2019-08-17 DIAGNOSIS — M6281 Muscle weakness (generalized): Secondary | ICD-10-CM

## 2019-08-17 DIAGNOSIS — M25511 Pain in right shoulder: Secondary | ICD-10-CM

## 2019-08-17 DIAGNOSIS — M542 Cervicalgia: Secondary | ICD-10-CM

## 2019-08-17 DIAGNOSIS — G8929 Other chronic pain: Secondary | ICD-10-CM

## 2019-08-17 NOTE — Therapy (Signed)
[place], [place], [address] Phone: [phone]   Fax:  [phone]  Physical Therapy Treatment  Patient Details  Name: Jorge Walker MRN: [id number] Date of Birth: 06-15-1966 Referring Provider (PT): Glade Lloyd Camelia Eng, PA-C   Encounter Date: 08/17/2019  PT End of Session - 08/17/19 1047    Visit Number  3    Number of Visits  6    Date for PT Re-Evaluation  09/23/19    Authorization Type  [employer]    PT Start Time  0915    PT Stop Time  0959    PT Time Calculation (min)  44 min    Activity Tolerance  Patient tolerated treatment well    Behavior During Therapy  [facility] for tasks assessed/performed       Past Medical History:  Diagnosis Date  . Allergy   . Arthritis    finger  . BPH (benign prostatic hypertrophy)   . Epididymal cyst   . Tobacco use disorder     Past Surgical History:  Procedure Laterality Date  . ARM WOUND REPAIR / CLOSURE     right upper posterior arm - teenager, s/p trauma  . ARTERY REPAIR     right upper arm - teenager, s/p trauma to right upper arm  . COLONOSCOPY  01/24/2016   Diverticulosis in left and right colon.   Repeat 10 years.  Dr. Scarlette Shorts  . ELBOW SURGERY  2019    There were no vitals filed for this visit.  Subjective Assessment - 08/17/19 0918    Subjective  Patient reports he is doing about the same. He is consistent with exercises.    Currently in Pain?  Yes    Pain Score  1     Pain Location  Shoulder    Pain Orientation  Right;Left    Pain Descriptors / Indicators  Aching    Pain Type  Chronic pain    Pain Onset  More than a month ago    Pain Frequency  Intermittent         [facility] PT Assessment - 08/17/19 0001      Posture/Postural Control   Posture Comments  Patient continued to exhibited rounded shoulder and forward head posture, inferior angle and medial border winging at rest      AROM   Overall AROM Comments  Patient exhibits inferior  angle winging with elevation, full overhead motion                   [facility] Adult PT Treatment/Exercise - 08/17/19 0001      Exercises   Exercises  Shoulder;Neck      Shoulder Exercises: Supine   Protraction  10 reps;Weights   2 sets   Protraction Weight (lbs)  6      Shoulder Exercises: Standing   External Rotation  15 reps;Theraband    Theraband Level (Shoulder External Rotation)  Level 2 (Red)    Internal Rotation  15 reps;Theraband    Theraband Level (Shoulder Internal Rotation)  Level 2 (Red)    Flexion  15 reps;Weights    Shoulder Flexion Weight (lbs)  2    Flexion Limitations  scaption, cued for setting shoulder blades in proper position before lifting    Extension  15 reps;Theraband    Theraband Level (Shoulder Extension)  Level 2 (Red)    Row  15 reps;Theraband    Theraband Level (Shoulder Row)  Level 3 (Green)  Shoulder Exercises: ROM/Strengthening   UBE (Upper Arm Bike)  L2 x4 min (2 fwd/2 bwd)      Shoulder Exercises: Stretch   Other Shoulder Stretches  Doorway pec stretch 3x20 sec - one arm at a time      Manual Therapy   Manual Therapy  Joint mobilization;Passive ROM    Joint Mobilization  Posterior and inferior GH joint mobs in various ranges of elevation    Passive ROM  Cross body posterior cuff stretch with scap stabilized       Neck Exercises: Stretches   Upper Trapezius Stretch  2 reps;20 seconds    Levator Stretch  2 reps;20 seconds             PT Education - 08/17/19 0923    Education Details  HEP    Person(s) Educated  Patient    Methods  Explanation;Demonstration;Verbal cues    Comprehension  Verbalized understanding;Returned demonstration;Verbal cues required;Need further instruction       PT Short Term Goals - 07/29/19 1756      PT SHORT TERM GOAL #1   Title  Patient will be independent with HEP to maintain progress in PT    Time  4    Period  Weeks    Status  New    Target Date  08/26/19      PT SHORT TERM GOAL  #2   Title  Patient will report < or = 2-3/10 bilateral shoulder pain while at work.    Time  4    Period  Weeks    Status  New    Target Date  08/26/19      PT SHORT TERM GOAL #3   Title  Patient will exhibit improved posture to reduce stress on shoulders/neck and improve lifting ability    Time  4    Period  Weeks    Status  New    Target Date  08/26/19        PT Long Term Goals - 07/29/19 1759      PT LONG TERM GOAL #1   Title  Patient will exhibit gross UE strength 4+/5 to improve lifting ability and tolerance at work    Time  8    Period  Weeks    Status  New    Target Date  09/23/19      PT LONG TERM GOAL #2   Title  Patient will report < or = 1-2/10 pain with lifting at work.    Time  8    Period  Weeks    Status  New    Target Date  09/23/19      PT LONG TERM GOAL #3   Title  Patient will report no limitation with lying on his side to improve sleeping.    Time  8    Period  Weeks    Status  New    Target Date  09/23/19      PT LONG TERM GOAL #4   Title  Patient will report improved function of FOTO < or = 28% limitation    Time  8    Period  Weeks    Status  New    Target Date  09/23/19            Plan - 08/17/19 1048    Clinical Impression Statement  Patient tolerated therapy well, and reported minimal benefit following manual therapy and stretching. He continues to exhibit postural and strength deficits of the  shoulder. He was progressed with strengthening this visit with good tolerance, his HEP was not adjusted to see how he responds to the additional strengthening exercise. He would benefit from continued skilled PT to improve shoulder pain and function so he can return to PLOF.    PT Treatment/Interventions  ADLs/Self Care Home Management;Cryotherapy;Electrical Stimulation;Moist Heat;Iontophoresis 4mg /ml Dexamethasone;Ultrasound;Therapeutic exercise;Therapeutic activities;Neuromuscular re-education;Patient/family education;Dry needling;Passive  range of motion;Manual techniques;Taping;Spinal Manipulations;Joint Manipulations    PT Next Visit Plan  Assess HEP, manual for shoulder, thoracic and cervical PRN; progress periscapular and rotator cuff strength and control, serratus strengthening    PT Home Exercise Plan  Banded row with green, extension with red, ER/IR with red; seated upper trap and levator stretching, seated chin tuck    Consulted and Agree with Plan of Care  Patient       Patient will benefit from skilled therapeutic intervention in order to improve the following deficits and impairments:  Decreased activity tolerance, Decreased strength, Impaired flexibility, Postural dysfunction, Pain  Visit Diagnosis: Chronic right shoulder pain  Chronic left shoulder pain  Cervicalgia  Muscle weakness (generalized)     Problem List Patient Active Problem List   Diagnosis Date Noted  . Abnormal blood cell count 07/30/2019  . Gross hematuria 07/30/2019  . Prostatitis 07/30/2019  . Elevated blood-pressure reading without diagnosis of hypertension 07/30/2019  . Elevated PSA 04/02/2019  . Drop in hemoglobin 04/02/2019  . Vaccine counseling 02/03/2019  . Abnormal EKG 11/28/2017  . Polyarthralgia 11/28/2017  . Encounter for health maintenance examination in adult 01/02/2016  . Special screening for malignant neoplasms, colon 01/02/2016  . Screening for prostate cancer 01/02/2016  . Benign prostatic hyperplasia 01/02/2016  . Nocturia 01/02/2016  . Lipoma of extremity 01/02/2016  . Epididymal cyst 07/07/2008  . Smoker 04/28/2008    Hilda Blades, PT, DPT, LAT, ATC 08/17/19  10:53 AM Phone: [phone] Fax: [place] [facility] [address] [place], [place], [address] Phone: [phone]   Fax:  [phone]  Name: ERICO DENTE MRN: [id number] Date of Birth: July 17, 1966

## 2019-08-26 ENCOUNTER — Encounter: Payer: Self-pay | Admitting: Physical Therapy

## 2019-08-26 ENCOUNTER — Other Ambulatory Visit: Payer: Self-pay

## 2019-08-26 ENCOUNTER — Ambulatory Visit: Payer: Managed Care, Other (non HMO) | Admitting: Physical Therapy

## 2019-08-26 DIAGNOSIS — M542 Cervicalgia: Secondary | ICD-10-CM

## 2019-08-26 DIAGNOSIS — M25511 Pain in right shoulder: Secondary | ICD-10-CM | POA: Diagnosis not present

## 2019-08-26 DIAGNOSIS — M6281 Muscle weakness (generalized): Secondary | ICD-10-CM

## 2019-08-26 DIAGNOSIS — G8929 Other chronic pain: Secondary | ICD-10-CM

## 2019-08-26 NOTE — Therapy (Signed)
[place], [place], [address] Phone: [phone]   Fax:  [phone]  Physical Therapy Treatment  Patient Details  Name: Jorge Walker MRN: [id number] Date of Birth: 1966-02-23 Referring Provider (PT): Glade Lloyd Camelia Eng, PA-C   Encounter Date: 08/26/2019  PT End of Session - 08/26/19 1408    Visit Number  4    Number of Visits  6    Date for PT Re-Evaluation  09/23/19    Authorization Type  [employer]    PT Start Time  1400    PT Stop Time  1442    PT Time Calculation (min)  42 min    Activity Tolerance  Patient tolerated treatment well    Behavior During Therapy  [facility] for tasks assessed/performed       Past Medical History:  Diagnosis Date  . Allergy   . Arthritis    finger  . BPH (benign prostatic hypertrophy)   . Epididymal cyst   . Tobacco use disorder     Past Surgical History:  Procedure Laterality Date  . ARM WOUND REPAIR / CLOSURE     right upper posterior arm - teenager, s/p trauma  . ARTERY REPAIR     right upper arm - teenager, s/p trauma to right upper arm  . COLONOSCOPY  01/24/2016   Diverticulosis in left and right colon.   Repeat 10 years.  Dr. Scarlette Shorts  . ELBOW SURGERY  2019    There were no vitals filed for this visit.  Subjective Assessment - 08/26/19 1402    Subjective  Patient reports he is still about the same. He continues to have pain in both shoulders and tingling in the left hand. He states that since last visit, he went to an orthopedic doctor and received cortisone shots in both shoulders.    Currently in Pain?  Yes    Pain Score  2     Pain Location  Shoulder    Pain Orientation  Right;Left   R > L   Pain Descriptors / Indicators  Aching    Pain Type  Chronic pain    Pain Radiating Towards  tingling of left hand    Pain Onset  More than a month ago    Pain Frequency  Intermittent                       [facility] Adult PT Treatment/Exercise  - 08/26/19 0001      Exercises   Exercises  Shoulder;Neck      Shoulder Exercises: Sidelying   Flexion  10 reps    Flexion Weight (lbs)  1    Flexion Limitations  Patient reports shoulder pain so exercise was d/c'd      Shoulder Exercises: Standing   Protraction  10 reps   2 sets   Theraband Level (Shoulder Protraction)  Level 2 (Red)    Protraction Limitations  mod VC for technique and proper scapular mechanics    External Rotation  15 reps;Theraband    Theraband Level (Shoulder External Rotation)  Level 2 (Red)    External Rotation Limitations  VC for posture    Internal Rotation  15 reps;Theraband    Theraband Level (Shoulder Internal Rotation)  Level 2 (Red)    Internal Rotation Limitations  VC for posture    Extension  15 reps;Theraband    Theraband Level (Shoulder Extension)  Level 2 (Red)    Extension Limitations  VC  for posture    Row  15 reps;Theraband   2 sets   Theraband Level (Shoulder Row)  Level 3 (Green)    Row Limitations  VC for posture      Shoulder Exercises: ROM/Strengthening   UBE (Upper Arm Bike)  L2 x4 min (2 fwd/2 bwd)      Shoulder Exercises: Stretch   Other Shoulder Stretches  Doorway pec stretch 3x20 sec - one arm at a time      Manual Therapy   Manual Therapy  Joint mobilization;Passive ROM    Joint Mobilization  Posterior GHJ     Passive ROM  Cross body posterior cuff stretch with scap stabilized       Neck Exercises: Stretches   Upper Trapezius Stretch  2 reps;20 seconds    Levator Stretch  2 reps;20 seconds             PT Education - 08/26/19 1408    Education Details  HEP    Person(s) Educated  Patient    Methods  Explanation;Demonstration;Verbal cues    Comprehension  Verbalized understanding;Returned demonstration;Verbal cues required;Need further instruction       PT Short Term Goals - 08/26/19 1715      PT SHORT TERM GOAL #1   Title  Patient will be independent with HEP to maintain progress in PT    Time  4    Period   Weeks    Status  On-going    Target Date  08/26/19      PT SHORT TERM GOAL #2   Title  Patient will report < or = 2-3/10 bilateral shoulder pain while at work.    Baseline  Patient no longer working, reports decreased shoulder pain    Time  4    Period  Weeks    Status  Partially Met    Target Date  08/26/19      PT SHORT TERM GOAL #3   Title  Patient will exhibit improved posture to reduce stress on shoulders/neck and improve lifting ability    Baseline  Continues to exhibit poor posture    Time  4    Period  Weeks    Status  On-going    Target Date  08/26/19        PT Long Term Goals - 07/29/19 1759      PT LONG TERM GOAL #1   Title  Patient will exhibit gross UE strength 4+/5 to improve lifting ability and tolerance at work    Time  8    Period  Weeks    Status  New    Target Date  09/23/19      PT LONG TERM GOAL #2   Title  Patient will report < or = 1-2/10 pain with lifting at work.    Time  8    Period  Weeks    Status  New    Target Date  09/23/19      PT LONG TERM GOAL #3   Title  Patient will report no limitation with lying on his side to improve sleeping.    Time  8    Period  Weeks    Status  New    Target Date  09/23/19      PT LONG TERM GOAL #4   Title  Patient will report improved function of FOTO < or = 28% limitation    Time  8    Period  Weeks    Status  New    Target Date  09/23/19            Plan - 08/26/19 1711    Clinical Impression Statement  Patient continues to report shoulder pain and left hand tingling. His symptoms seem consistent with RC tendinopahty and exhibit poor posture with scapular dyskinesis and strength deficits of the periscapular musculature. This visit focused primarily on progressing lower trap/serratus and cuff strengthening to reduce pain. Serratus ant strengthening was added to his HEP to improve scapular mechanics. He would benefit from continued skilled PT to progress his strength/mechanics and posture in  order to reduce pain and improve activity tolerance so he can return to PLOF.    PT Treatment/Interventions  ADLs/Self Care Home Management;Cryotherapy;Electrical Stimulation;Moist Heat;Iontophoresis '4mg'$ /ml Dexamethasone;Ultrasound;Therapeutic exercise;Therapeutic activities;Neuromuscular re-education;Patient/family education;Dry needling;Passive range of motion;Manual techniques;Taping;Spinal Manipulations;Joint Manipulations    PT Next Visit Plan  Assess HEP, manual for shoulder, thoracic and cervical PRN; progress periscapular and rotator cuff strength and control, serratus strengthening    PT Home Exercise Plan  Banded row with green, extension with red, ER/IR with red, serratus hug with red; seated upper trap and levator stretching, seated chin tuck    Consulted and Agree with Plan of Care  Patient       Patient will benefit from skilled therapeutic intervention in order to improve the following deficits and impairments:  Decreased activity tolerance, Decreased strength, Impaired flexibility, Postural dysfunction, Pain  Visit Diagnosis: Chronic right shoulder pain  Chronic left shoulder pain  Cervicalgia  Muscle weakness (generalized)     Problem List Patient Active Problem List   Diagnosis Date Noted  . Abnormal blood cell count 07/30/2019  . Gross hematuria 07/30/2019  . Prostatitis 07/30/2019  . Elevated blood-pressure reading without diagnosis of hypertension 07/30/2019  . Elevated PSA 04/02/2019  . Drop in hemoglobin 04/02/2019  . Vaccine counseling 02/03/2019  . Abnormal EKG 11/28/2017  . Polyarthralgia 11/28/2017  . Encounter for health maintenance examination in adult 01/02/2016  . Special screening for malignant neoplasms, colon 01/02/2016  . Screening for prostate cancer 01/02/2016  . Benign prostatic hyperplasia 01/02/2016  . Nocturia 01/02/2016  . Lipoma of extremity 01/02/2016  . Epididymal cyst 07/07/2008  . Smoker 04/28/2008    Hilda Blades, PT, DPT,  LAT, ATC 08/26/19  5:19 PM Phone: [phone] Fax: [place] [facility] [address] [place], [place], [address] Phone: [phone]   Fax:  [phone]  Name: Jorge Walker MRN: [id number] Date of Birth: Jul 30, 1966

## 2019-09-02 ENCOUNTER — Other Ambulatory Visit: Payer: Self-pay

## 2019-09-02 ENCOUNTER — Encounter: Payer: Self-pay | Admitting: Physical Therapy

## 2019-09-02 ENCOUNTER — Ambulatory Visit: Payer: 59 | Attending: Medical | Admitting: Physical Therapy

## 2019-09-02 DIAGNOSIS — M25511 Pain in right shoulder: Secondary | ICD-10-CM | POA: Insufficient documentation

## 2019-09-02 DIAGNOSIS — G8929 Other chronic pain: Secondary | ICD-10-CM | POA: Diagnosis present

## 2019-09-02 DIAGNOSIS — M542 Cervicalgia: Secondary | ICD-10-CM | POA: Insufficient documentation

## 2019-09-02 DIAGNOSIS — M25512 Pain in left shoulder: Secondary | ICD-10-CM | POA: Insufficient documentation

## 2019-09-02 DIAGNOSIS — M6281 Muscle weakness (generalized): Secondary | ICD-10-CM | POA: Insufficient documentation

## 2019-09-02 NOTE — Therapy (Signed)
[place], [place], [address] Phone: [phone]   Fax:  [phone]  Physical Therapy Treatment  Patient Details  Name: Jorge Walker MRN: [id number] Date of Birth: 08-Jan-1966 Referring Provider (PT): Glade Lloyd Camelia Eng, PA-C   Encounter Date: 09/02/2019  PT End of Session - 09/02/19 1405    Visit Number  5    Number of Visits  6    Date for PT Re-Evaluation  09/23/19    Authorization Type  [employer]    PT Start Time  1401    PT Stop Time  1439    PT Time Calculation (min)  38 min    Activity Tolerance  Patient tolerated treatment well    Behavior During Therapy  [facility] for tasks assessed/performed       Past Medical History:  Diagnosis Date  . Allergy   . Arthritis    finger  . BPH (benign prostatic hypertrophy)   . Epididymal cyst   . Tobacco use disorder     Past Surgical History:  Procedure Laterality Date  . ARM WOUND REPAIR / CLOSURE     right upper posterior arm - teenager, s/p trauma  . ARTERY REPAIR     right upper arm - teenager, s/p trauma to right upper arm  . COLONOSCOPY  01/24/2016   Diverticulosis in left and right colon.   Repeat 10 years.  Dr. Scarlette Shorts  . ELBOW SURGERY  2019    There were no vitals filed for this visit.  Subjective Assessment - 09/02/19 1405    Subjective  Patient reports no real improvement in his shoulder symptoms. He continues to have the left hand numbness that seems to come on randomly and does not affect his grip.    Currently in Pain?  Yes    Pain Score  1     Pain Location  Shoulder    Pain Orientation  Right;Left    Pain Descriptors / Indicators  Aching    Pain Type  Chronic pain    Pain Radiating Towards  numbness/tingling of left hand    Pain Onset  More than a month ago    Pain Frequency  Intermittent                       [facility] Adult PT Treatment/Exercise - 09/02/19 0001      Exercises   Exercises   Shoulder;Neck      Shoulder Exercises: Prone   Retraction  10 reps   5 sec hold     Shoulder Exercises: Standing   External Rotation  15 reps;Theraband   2 sets   Theraband Level (Shoulder External Rotation)  Level 2 (Red)    External Rotation Limitations  VC for posture    Internal Rotation  15 reps;Theraband   2 sets   Theraband Level (Shoulder Internal Rotation)  Level 2 (Red)    Internal Rotation Limitations  VC for posture    Extension  15 reps;Theraband   2 sets   Theraband Level (Shoulder Extension)  Level 3 (Green)    Row  15 reps;Theraband   2 sets   Theraband Level (Shoulder Row)  Level 4 (Blue)      Shoulder Exercises: Stretch   Other Shoulder Stretches  Supine pec stretch on foam roller 3x30 sec      Neck Exercises: Stretches   Upper Trapezius Stretch  2 reps;20 seconds    Levator Stretch  2 reps;20 seconds             PT Education - 09/02/19 1405    Education Details  HEP    Person(s) Educated  Patient    Methods  Explanation;Demonstration;Verbal cues    Comprehension  Verbalized understanding;Returned demonstration;Verbal cues required;Need further instruction       PT Short Term Goals - 08/26/19 1715      PT SHORT TERM GOAL #1   Title  Patient will be independent with HEP to maintain progress in PT    Time  4    Period  Weeks    Status  On-going    Target Date  08/26/19      PT SHORT TERM GOAL #2   Title  Patient will report < or = 2-3/10 bilateral shoulder pain while at work.    Baseline  Patient no longer working, reports decreased shoulder pain    Time  4    Period  Weeks    Status  Partially Met    Target Date  08/26/19      PT SHORT TERM GOAL #3   Title  Patient will exhibit improved posture to reduce stress on shoulders/neck and improve lifting ability    Baseline  Continues to exhibit poor posture    Time  4    Period  Weeks    Status  On-going    Target Date  08/26/19        PT Long Term Goals - 07/29/19 1759      PT  LONG TERM GOAL #1   Title  Patient will exhibit gross UE strength 4+/5 to improve lifting ability and tolerance at work    Time  8    Period  Weeks    Status  New    Target Date  09/23/19      PT LONG TERM GOAL #2   Title  Patient will report < or = 1-2/10 pain with lifting at work.    Time  8    Period  Weeks    Status  New    Target Date  09/23/19      PT LONG TERM GOAL #3   Title  Patient will report no limitation with lying on his side to improve sleeping.    Time  8    Period  Weeks    Status  New    Target Date  09/23/19      PT LONG TERM GOAL #4   Title  Patient will report improved function of FOTO < or = 28% limitation    Time  8    Period  Weeks    Status  New    Target Date  09/23/19            Plan - 09/02/19 1420    Clinical Impression Statement  Patient seems to be doing better this visit and reports less pain in his shoulder but he continues to have the left hand numbness. He doesnt exhibit any radicular symptoms with cervical testing and the numbness in his left hand is not provoked or reduced with cervical or shoulder motion/testing. He did well with the periscapular and rotator cuff strengthening, but he does continue to exhibit strength deficit and poor scapular control. He would benefit from continued skilled PT to progress his strength/mechanics and posture in order to reduce pain and improve activity tolerance so he can return to PLOF.    PT Treatment/Interventions  ADLs/Self Care Home Management;Cryotherapy;Dealer  Stimulation;Moist Heat;Iontophoresis '4mg'$ /ml Dexamethasone;Ultrasound;Therapeutic exercise;Therapeutic activities;Neuromuscular re-education;Patient/family education;Dry needling;Passive range of motion;Manual techniques;Taping;Spinal Manipulations;Joint Manipulations    PT Next Visit Plan  Assess HEP, manual for shoulder, thoracic and cervical PRN; progress periscapular and rotator cuff strength and control, serratus strengthening    PT Home  Exercise Plan  Banded row with green, extension with red, ER/IR with red, serratus hug with red; seated upper trap and levator stretching, seated chin tuck, doorway pec stretch    Consulted and Agree with Plan of Care  Patient       Patient will benefit from skilled therapeutic intervention in order to improve the following deficits and impairments:  Decreased activity tolerance, Decreased strength, Impaired flexibility, Postural dysfunction, Pain  Visit Diagnosis: Chronic left shoulder pain  Chronic right shoulder pain  Cervicalgia  Muscle weakness (generalized)     Problem List Patient Active Problem List   Diagnosis Date Noted  . Abnormal blood cell count 07/30/2019  . Gross hematuria 07/30/2019  . Prostatitis 07/30/2019  . Elevated blood-pressure reading without diagnosis of hypertension 07/30/2019  . Elevated PSA 04/02/2019  . Drop in hemoglobin 04/02/2019  . Vaccine counseling 02/03/2019  . Abnormal EKG 11/28/2017  . Polyarthralgia 11/28/2017  . Encounter for health maintenance examination in adult 01/02/2016  . Special screening for malignant neoplasms, colon 01/02/2016  . Screening for prostate cancer 01/02/2016  . Benign prostatic hyperplasia 01/02/2016  . Nocturia 01/02/2016  . Lipoma of extremity 01/02/2016  . Epididymal cyst 07/07/2008  . Smoker 04/28/2008    Hilda Blades, PT, DPT, LAT, ATC 09/02/19  2:50 PM Phone: [phone] Fax: [place] [facility] [address] [place], [place], [address] Phone: [phone]   Fax:  [phone]  Name: ABID BOLLA MRN: [id number] Date of Birth: [birth year]

## 2019-09-03 ENCOUNTER — Other Ambulatory Visit: Payer: Managed Care, Other (non HMO)

## 2019-09-03 ENCOUNTER — Encounter: Payer: Self-pay | Admitting: Medical

## 2019-09-03 ENCOUNTER — Ambulatory Visit (INDEPENDENT_AMBULATORY_CARE_PROVIDER_SITE_OTHER): Payer: 59 | Admitting: Medical

## 2019-09-03 VITALS — BP 136/72 | HR 64 | Temp 97.8°F | Ht 69.5 in | Wt 177.2 lb

## 2019-09-03 DIAGNOSIS — G8929 Other chronic pain: Secondary | ICD-10-CM

## 2019-09-03 DIAGNOSIS — M545 Low back pain, unspecified: Secondary | ICD-10-CM

## 2019-09-03 DIAGNOSIS — R030 Elevated blood-pressure reading, without diagnosis of hypertension: Secondary | ICD-10-CM

## 2019-09-03 DIAGNOSIS — R310 Gross hematuria: Secondary | ICD-10-CM | POA: Diagnosis not present

## 2019-09-03 DIAGNOSIS — R350 Frequency of micturition: Secondary | ICD-10-CM

## 2019-09-03 DIAGNOSIS — N401 Enlarged prostate with lower urinary tract symptoms: Secondary | ICD-10-CM

## 2019-09-03 DIAGNOSIS — F17200 Nicotine dependence, unspecified, uncomplicated: Secondary | ICD-10-CM | POA: Diagnosis not present

## 2019-09-03 DIAGNOSIS — R972 Elevated prostate specific antigen [PSA]: Secondary | ICD-10-CM

## 2019-09-03 LAB — CBC WITH DIFFERENTIAL/PLATELET
Basophils Absolute: 0 10*3/uL (ref 0.0–0.2)
Basos: 1 %
EOS (ABSOLUTE): 0.3 10*3/uL (ref 0.0–0.4)
Eos: 4 %
Hematocrit: 40.4 % (ref 37.5–51.0)
Hemoglobin: 12.8 g/dL — ABNORMAL LOW (ref 13.0–17.7)
Immature Grans (Abs): 0 10*3/uL (ref 0.0–0.1)
Immature Granulocytes: 0 %
Lymphocytes Absolute: 2.5 10*3/uL (ref 0.7–3.1)
Lymphs: 40 %
MCH: 26.7 pg (ref 26.6–33.0)
MCHC: 31.7 g/dL (ref 31.5–35.7)
MCV: 84 fL (ref 79–97)
Monocytes Absolute: 0.6 10*3/uL (ref 0.1–0.9)
Monocytes: 9 %
Neutrophils Absolute: 2.9 10*3/uL (ref 1.4–7.0)
Neutrophils: 46 %
Platelets: 225 10*3/uL (ref 150–450)
RBC: 4.79 x10E6/uL (ref 4.14–5.80)
RDW: 13.5 % (ref 11.6–15.4)
WBC: 6.3 10*3/uL (ref 3.4–10.8)

## 2019-09-03 LAB — BASIC METABOLIC PANEL
BUN/Creatinine Ratio: 13 (ref 9–20)
BUN: 14 mg/dL (ref 6–24)
CO2: 22 mmol/L (ref 20–29)
Calcium: 10.1 mg/dL (ref 8.7–10.2)
Chloride: 103 mmol/L (ref 96–106)
Creatinine, Ser: 1.09 mg/dL (ref 0.76–1.27)
GFR calc Af Amer: 88 mL/min/{1.73_m2} (ref >59)
GFR calc non Af Amer: 77 mL/min/{1.73_m2} (ref >59)
Glucose: 101 mg/dL — ABNORMAL HIGH (ref 65–99)
Potassium: 5.2 mmol/L (ref 3.5–5.2)
Sodium: 138 mmol/L (ref 134–144)

## 2019-09-03 LAB — POCT URINALYSIS DIP (PROADVANTAGE DEVICE)
Bilirubin, UA: NEGATIVE
Glucose, UA: NEGATIVE mg/dL
Ketones, POC UA: NEGATIVE mg/dL
Leukocytes, UA: NEGATIVE
Nitrite, UA: NEGATIVE
Protein Ur, POC: NEGATIVE mg/dL
Specific Gravity, Urine: 1.03
Urobilinogen, Ur: NEGATIVE
pH, UA: 6 (ref 5.0–8.0)

## 2019-09-03 MED ORDER — DOXYCYCLINE HYCLATE 100 MG PO TABS: 100.0000 mg | ORAL_TABLET | Freq: Two times a day (BID) | ORAL | 0 refills | Status: DC

## 2019-09-03 NOTE — Addendum Note (Signed)
Addended by: Edgar Frisk on: 09/03/2019 09:48 AM   Modules accepted: Orders

## 2019-09-03 NOTE — Progress Notes (Signed)
Subjective: Chief Complaint  Patient presents with  . Hematuria    clumps of blood out of penis    Here for episode of blood coming out penis yesterday.  Had a small clot of blood coming out of penis yesterday.  Kind of dark red yesterday.  Felt some general discomfort in abdomen and back afterwards, but not bad.  Still has ongoing urinary hesitancy, frequency for the last several weeks.  Sometimes cloudy urine.  No burning with urination.  No testicle pain or swelling.  No penile discharge.  No fever.  No nausea or vomiting.  Has girlfriend, same partner x 6 years.  We discussed the same symptoms back in November.  His symptoms did clear up in November after a round of antibiotic.  Been a smoker 15 years for 1 ppd.    No family history of cancer.  Here for follow-up on elevated blood pressure back in November  He has chronic back pain.  This is intermittent.  No pain down the legs.  Past Medical History:  Diagnosis Date  . Allergy   . Arthritis    finger  . BPH (benign prostatic hypertrophy)   . Epididymal cyst   . Tobacco use disorder     Family History  Problem Relation Age of Onset  . Hypertension Mother   . Diabetes Mother   . Pneumonia Father        died of pneumonia  . Cancer Neg Hx   . Heart disease Neg Hx   . Stroke Neg Hx   . Colon cancer Neg Hx   . Colon polyps Neg Hx   . Esophageal cancer Neg Hx   . Rectal cancer Neg Hx   . Stomach cancer Neg Hx       Objective: BP 136/72   Pulse 64   Temp 97.8 F (36.6 C)   Ht 5' 9.5" (1.765 m)   Wt 177 lb 3.2 oz (80.4 kg)   SpO2 99%   BMI 25.79 kg/m   BP Readings from Last 3 Encounters:  09/03/19 136/72  07/30/19 (!) 148/82  07/06/19 138/86   Wt Readings from Last 3 Encounters:  09/03/19 177 lb 3.2 oz (80.4 kg)  07/30/19 173 lb 9.6 oz (78.7 kg)  07/06/19 175 lb 3.2 oz (79.5 kg)   General: Well-developed well-nourished no acute distress, African-American male Abdomen: Positive bowel sounds, soft,  nontender, no mass no organomegaly no lymphadenopathy in the inguinal region Back: Nontender but there are some spasms in the bilateral lumbar paraspinal region, relatively normal range of motion Legs nontender, normal range of motion Neuro: Negative straight leg raise, normal leg strength and sensation Pulses normal lower extremities No lower extremity edema GU rectal deferred today    Assessment: Encounter Diagnoses  Name Primary?  Jorge Walker hematuria Yes  . Smoker   . Benign prostatic hyperplasia with urinary frequency   . Elevated PSA   . Chronic right-sided low back pain, unspecified whether sciatica present   . Elevated blood-pressure reading without diagnosis of hypertension      Plan: Given the gross blood clot hematuria, dysuria, smoking history, elevated PSA-we will go ahead and refer to urology and pursue CT abdomen pelvis.  I will go ahead and treat him with a round of doxycycline.  He has similar symptoms that we treated with Bactrim back in November.  Given the recurrence it is time for further evaluation  Tobacco use-advised cessation.  Discussed risk of tobacco use including cancer, COPD, heart disease  and other  Chronic back pain-we will pursue CT abdomen pelvis and will see if anything looks unusual in the spine with this scan.  We discussed general stretching and exercise.  He does have some spasm on exam.  Discussed massage therapy  Elevated blood pressure.  Blood pressures seem okay today  Eilert was seen today for hematuria.  Diagnoses and all orders for this visit:  Gross hematuria -     Ambulatory referral to Urology -     CT Abdomen Pelvis W Contrast; Future -     Basic Metabolic Panel -     CBC with Differential  Smoker -     Ambulatory referral to Urology -     CT Abdomen Pelvis W Contrast; Future -     Basic Metabolic Panel -     CBC with Differential  Benign prostatic hyperplasia with urinary frequency -     Ambulatory referral to Urology -      CT Abdomen Pelvis W Contrast; Future -     Basic Metabolic Panel -     CBC with Differential  Elevated PSA -     Ambulatory referral to Urology -     CT Abdomen Pelvis W Contrast; Future -     Basic Metabolic Panel -     CBC with Differential  Chronic right-sided low back pain, unspecified whether sciatica present  Elevated blood-pressure reading without diagnosis of hypertension  Other orders -     doxycycline (VIBRA-TABS) 100 MG tablet; Take 1 tablet (100 mg total) by mouth 2 (two) times daily.   Follow-up pending labs CT and referral

## 2019-09-08 ENCOUNTER — Ambulatory Visit: Payer: 59 | Admitting: Physical Therapy

## 2019-09-08 ENCOUNTER — Encounter: Payer: Self-pay | Admitting: Physical Therapy

## 2019-09-08 ENCOUNTER — Other Ambulatory Visit: Payer: Self-pay

## 2019-09-08 DIAGNOSIS — G8929 Other chronic pain: Secondary | ICD-10-CM

## 2019-09-08 DIAGNOSIS — M25512 Pain in left shoulder: Secondary | ICD-10-CM | POA: Diagnosis not present

## 2019-09-08 DIAGNOSIS — M6281 Muscle weakness (generalized): Secondary | ICD-10-CM

## 2019-09-08 DIAGNOSIS — M542 Cervicalgia: Secondary | ICD-10-CM

## 2019-09-08 NOTE — Therapy (Signed)
[place], [place], [address] Phone: [phone]   Fax:  [phone]  Physical Therapy Treatment  Patient Details  Name: Jorge Walker MRN: [id number] Date of Birth: 02-18-1966 Referring Provider (PT): Glade Lloyd Camelia Eng, PA-C   Encounter Date: 09/08/2019  PT End of Session - 09/08/19 1006    Visit Number  6    Number of Visits  6    Date for PT Re-Evaluation  09/23/19    Authorization Type  [employer]    PT Start Time  1000    PT Stop Time  1040    PT Time Calculation (min)  40 min    Activity Tolerance  Patient tolerated treatment well    Behavior During Therapy  [facility] for tasks assessed/performed       Past Medical History:  Diagnosis Date  . Allergy   . Arthritis    finger  . BPH (benign prostatic hypertrophy)   . Epididymal cyst   . Tobacco use disorder     Past Surgical History:  Procedure Laterality Date  . ARM WOUND REPAIR / CLOSURE     right upper posterior arm - teenager, s/p trauma  . ARTERY REPAIR     right upper arm - teenager, s/p trauma to right upper arm  . COLONOSCOPY  01/24/2016   Diverticulosis in left and right colon.   Repeat 10 years.  Dr. Scarlette Shorts  . ELBOW SURGERY  2019    There were no vitals filed for this visit.  Subjective Assessment - 09/08/19 1003    Subjective  Patient reports he may have over-did his exercises and aggravated the left side of his neck. He continues to have the left hand numbness and shoulder pain (R>L).    Currently in Pain?  Yes    Pain Score  4     Pain Location  Neck    Pain Orientation  Left    Pain Descriptors / Indicators  Aching;Throbbing    Pain Type  Chronic pain    Pain Radiating Towards  numbness/tingling of left hand    Pain Onset  More than a month ago    Pain Frequency  Intermittent                       [facility] Adult PT Treatment/Exercise - 09/08/19 0001      Exercises   Exercises   Shoulder;Neck      Neck Exercises: Seated   Neck Retraction  5 reps;3 secs    Cervical Rotation  10 reps    Lateral Flexion  10 reps    Shoulder Rolls  Backwards;10 reps    Other Seated Exercise  Cervical circles x10 each      Shoulder Exercises: ROM/Strengthening   UBE (Upper Arm Bike)  L2 x4 min (2 fwd/2 bwd)      Manual Therapy   Manual Therapy  Soft tissue mobilization;Myofascial release;Passive ROM;Manual Traction    Soft tissue mobilization  Prone left sided scalenes, upper trap, levator region    Myofascial Release  Suboccipital release, skilled palpation for TPDN on right    Passive ROM  Supine upper trap and levator stretching    Manual Traction  In combination with suboccipital release      Neck Exercises: Stretches   Upper Trapezius Stretch  2 reps;20 seconds    Levator Stretch  2 reps;20 seconds       Trigger Point Dry Needling -  09/08/19 0001    Consent Given?  Yes    Education Handout Provided  No    Muscles Treated Head and Neck  Upper trapezius;Levator scapulae   right   Dry Needling Comments  Patient in prone position    Upper Trapezius Response  Twitch reponse elicited;Palpable increased muscle length    Levator Scapulae Response  Twitch response elicited;Palpable increased muscle length     TPDN performed by Carlus Pavlov PT      PT Education - 09/08/19 1005    Education Details  HEP, using ball or theracane for self STM for upper trap and levator region    Person(s) Educated  Patient    Methods  Explanation;Demonstration;Verbal cues;Tactile cues    Comprehension  Verbalized understanding;Returned demonstration;Verbal cues required;Tactile cues required;Need further instruction       PT Short Term Goals - 08/26/19 1715      PT SHORT TERM GOAL #1   Title  Patient will be independent with HEP to maintain progress in PT    Time  4    Period  Weeks    Status  On-going    Target Date  08/26/19      PT SHORT TERM GOAL #2   Title  Patient will  report < or = 2-3/10 bilateral shoulder pain while at work.    Baseline  Patient no longer working, reports decreased shoulder pain    Time  4    Period  Weeks    Status  Partially Met    Target Date  08/26/19      PT SHORT TERM GOAL #3   Title  Patient will exhibit improved posture to reduce stress on shoulders/neck and improve lifting ability    Baseline  Continues to exhibit poor posture    Time  4    Period  Weeks    Status  On-going    Target Date  08/26/19        PT Long Term Goals - 07/29/19 1759      PT LONG TERM GOAL #1   Title  Patient will exhibit gross UE strength 4+/5 to improve lifting ability and tolerance at work    Time  8    Period  Weeks    Status  New    Target Date  09/23/19      PT LONG TERM GOAL #2   Title  Patient will report < or = 1-2/10 pain with lifting at work.    Time  8    Period  Weeks    Status  New    Target Date  09/23/19      PT LONG TERM GOAL #3   Title  Patient will report no limitation with lying on his side to improve sleeping.    Time  8    Period  Weeks    Status  New    Target Date  09/23/19      PT LONG TERM GOAL #4   Title  Patient will report improved function of FOTO < or = 28% limitation    Time  8    Period  Weeks    Status  New    Target Date  09/23/19            Plan - 09/08/19 1240    Clinical Impression Statement  Therapy today consisted mostly of manual therapy focused on reducing left sided neck pain and right shoulder discomfort. He did exhibit trigger points along left upper  trap and levator scap region that reproduced the left sided neck pain he was reporting, and trigger points along right upper trap and levator that reproduced persisent posterior shoulder soreness. He did report slight improvement following manual therapy and was encouraged to perform exercises as toleratd at home to improve postural control.    PT Treatment/Interventions  ADLs/Self Care Home Management;Cryotherapy;Electrical  Stimulation;Moist Heat;Iontophoresis '4mg'$ /ml Dexamethasone;Ultrasound;Therapeutic exercise;Therapeutic activities;Neuromuscular re-education;Patient/family education;Dry needling;Passive range of motion;Manual techniques;Taping;Spinal Manipulations;Joint Manipulations    PT Next Visit Plan  Assess HEP, manual for shoulder, thoracic and cervical PRN; progress periscapular and rotator cuff strength and control, serratus strengthening    PT Home Exercise Plan  Banded row with green, extension with red, ER/IR with red, serratus hug with red; seated upper trap and levator stretching, seated chin tuck, doorway pec stretch, ball STM for upper trap/levator    Consulted and Agree with Plan of Care  Patient       Patient will benefit from skilled therapeutic intervention in order to improve the following deficits and impairments:  Decreased activity tolerance, Decreased strength, Impaired flexibility, Postural dysfunction, Pain  Visit Diagnosis: Cervicalgia  Chronic left shoulder pain  Chronic right shoulder pain  Muscle weakness (generalized)     Problem List Patient Active Problem List   Diagnosis Date Noted  . Chronic right-sided low back pain 09/03/2019  . Abnormal blood cell count 07/30/2019  . Gross hematuria 07/30/2019  . Prostatitis 07/30/2019  . Elevated blood-pressure reading without diagnosis of hypertension 07/30/2019  . Elevated PSA 04/02/2019  . Drop in hemoglobin 04/02/2019  . Vaccine counseling 02/03/2019  . Abnormal EKG 11/28/2017  . Polyarthralgia 11/28/2017  . Encounter for health maintenance examination in adult 01/02/2016  . Special screening for malignant neoplasms, colon 01/02/2016  . Screening for prostate cancer 01/02/2016  . Benign prostatic hyperplasia 01/02/2016  . Nocturia 01/02/2016  . Lipoma of extremity 01/02/2016  . Epididymal cyst 07/07/2008  . Smoker 04/28/2008    Hilda Blades, PT, DPT, LAT, ATC 09/08/19  12:54 PM Phone: [phone] Fax:  [place] [facility] [address] [place], [place], [address] Phone: [phone]   Fax:  [phone]  Name: LUMIR DEMETRIOU MRN: [id number] Date of Birth: [birth year]

## 2019-09-10 ENCOUNTER — Other Ambulatory Visit: Payer: Managed Care, Other (non HMO)

## 2019-09-15 ENCOUNTER — Encounter: Payer: Self-pay | Admitting: Physical Therapy

## 2019-09-15 ENCOUNTER — Ambulatory Visit: Payer: 59 | Admitting: Physical Therapy

## 2019-09-15 ENCOUNTER — Other Ambulatory Visit: Payer: Self-pay

## 2019-09-15 DIAGNOSIS — G8929 Other chronic pain: Secondary | ICD-10-CM

## 2019-09-15 DIAGNOSIS — M25512 Pain in left shoulder: Secondary | ICD-10-CM | POA: Diagnosis not present

## 2019-09-15 DIAGNOSIS — M542 Cervicalgia: Secondary | ICD-10-CM

## 2019-09-15 DIAGNOSIS — M6281 Muscle weakness (generalized): Secondary | ICD-10-CM

## 2019-09-15 NOTE — Therapy (Signed)
[place], [place], [address] Phone: [phone]   Fax:  [phone]  Physical Therapy Treatment  Patient Details  Name: Jorge Walker MRN: [id number] Date of Birth: 1966/04/06 Referring Provider (PT): Glade Lloyd Camelia Eng, PA-C   Encounter Date: 09/15/2019  PT End of Session - 09/15/19 0913    Visit Number  7    Date for PT Re-Evaluation  09/23/19    Authorization Type  [employer]    PT Start Time  0910    PT Stop Time  0950    PT Time Calculation (min)  40 min    Activity Tolerance  Patient tolerated treatment well    Behavior During Therapy  [facility] for tasks assessed/performed       Past Medical History:  Diagnosis Date  . Allergy   . Arthritis    finger  . BPH (benign prostatic hypertrophy)   . Epididymal cyst   . Tobacco use disorder     Past Surgical History:  Procedure Laterality Date  . ARM WOUND REPAIR / CLOSURE     right upper posterior arm - teenager, s/p trauma  . ARTERY REPAIR     right upper arm - teenager, s/p trauma to right upper arm  . COLONOSCOPY  01/24/2016   Diverticulosis in left and right colon.   Repeat 10 years.  Dr. Scarlette Shorts  . ELBOW SURGERY  2019    There were no vitals filed for this visit.  Subjective Assessment - 09/15/19 0910    Subjective  Patient reports he may have gotten some benfit from the manual treatment last visit. His right shoulder is still sore and mainly when he is reaching backwardis when he has the most discomfort. The neck is feeling better.    Patient Stated Goals  Reduce pain to improve ability to work and sleep    Currently in Pain?  Yes    Pain Score  4     Pain Location  Neck    Pain Descriptors / Indicators  Aching;Sore    Pain Type  Chronic pain    Pain Onset  More than a month ago    Pain Frequency  Intermittent                       [facility] Adult PT Treatment/Exercise - 09/15/19 0001      Exercises    Exercises  Shoulder;Neck      Shoulder Exercises: Standing   External Rotation  15 reps;Theraband    Theraband Level (Shoulder External Rotation)  Level 2 (Red)    Internal Rotation  15 reps;Theraband    Theraband Level (Shoulder Internal Rotation)  Level 2 (Red)    Extension  15 reps;Theraband    Theraband Level (Shoulder Extension)  Level 2 (Red)    Row  15 reps;Theraband    Theraband Level (Shoulder Row)  Level 3 (Green)      Shoulder Exercises: ROM/Strengthening   UBE (Upper Arm Bike)  L2 x5 min (fwd/bwd)      Shoulder Exercises: Stretch   Cross Chest Stretch  3 reps;20 seconds    Cross Chest Stretch Limitations  corss body post cuff stretch against wall    Other Shoulder Stretches  SMFR using tennis ball to post cuff with cross body arm movement      Manual Therapy   Manual Therapy  Soft tissue mobilization;Myofascial release;Passive ROM    Soft tissue mobilization  Prone right  posterior cuff, upper trap, levator, rhomboid    Myofascial Release  Right posterior cuff, skilled palpation for TPDN to teres minor and rhomboid    Passive ROM  Supine posterior cuff stretch with scap pinned             PT Education - 09/15/19 0913    Education Details  HEP    Person(s) Educated  Patient    Methods  Explanation;Demonstration;Verbal cues;Tactile cues    Comprehension  Verbalized understanding;Returned demonstration;Verbal cues required;Tactile cues required       PT Short Term Goals - 08/26/19 1715      PT SHORT TERM GOAL #1   Title  Patient will be independent with HEP to maintain progress in PT    Time  4    Period  Weeks    Status  On-going    Target Date  08/26/19      PT SHORT TERM GOAL #2   Title  Patient will report < or = 2-3/10 bilateral shoulder pain while at work.    Baseline  Patient no longer working, reports decreased shoulder pain    Time  4    Period  Weeks    Status  Partially Met    Target Date  08/26/19      PT SHORT TERM GOAL #3   Title   Patient will exhibit improved posture to reduce stress on shoulders/neck and improve lifting ability    Baseline  Continues to exhibit poor posture    Time  4    Period  Weeks    Status  On-going    Target Date  08/26/19        PT Long Term Goals - 07/29/19 1759      PT LONG TERM GOAL #1   Title  Patient will exhibit gross UE strength 4+/5 to improve lifting ability and tolerance at work    Time  8    Period  Weeks    Status  New    Target Date  09/23/19      PT LONG TERM GOAL #2   Title  Patient will report < or = 1-2/10 pain with lifting at work.    Time  8    Period  Weeks    Status  New    Target Date  09/23/19      PT LONG TERM GOAL #3   Title  Patient will report no limitation with lying on his side to improve sleeping.    Time  8    Period  Weeks    Status  New    Target Date  09/23/19      PT LONG TERM GOAL #4   Title  Patient will report improved function of FOTO < or = 28% limitation    Time  8    Period  Weeks    Status  New    Target Date  09/23/19            Plan - 09/15/19 0914    Clinical Impression Statement  Patient reported concordant right shoulder symptoms with STM to the posterior cuff and rhomboid region this visit so dry needling was used again to assist in relaxing these muscles. He did report some benfit following manual therapy and he was instructed on methods to perform this at home using tennis ball and stretching against wall. His symptoms continue to seem consistent with rotator cuff tendinopathy. He would benefit from continued skilled PT to improve his  pain with reaching back.    PT Treatment/Interventions  ADLs/Self Care Home Management;Cryotherapy;Electrical Stimulation;Moist Heat;Iontophoresis '4mg'$ /ml Dexamethasone;Ultrasound;Therapeutic exercise;Therapeutic activities;Neuromuscular re-education;Patient/family education;Dry needling;Passive range of motion;Manual techniques;Taping;Spinal Manipulations;Joint Manipulations    PT Next  Visit Plan  Assess HEP, manual for shoulder, thoracic and cervical PRN; progress periscapular and rotator cuff strength and control, serratus strengthening    PT Home Exercise Plan  Banded row with green, extension with red, ER/IR with red, serratus hug with red; seated upper trap and levator stretching, seated chin tuck, doorway pec stretch, ball STM for upper trap/levator/posterior cuff, cross body stretch against wall    Consulted and Agree with Plan of Care  Patient       Patient will benefit from skilled therapeutic intervention in order to improve the following deficits and impairments:  Decreased activity tolerance, Decreased strength, Impaired flexibility, Postural dysfunction, Pain  Visit Diagnosis: Chronic right shoulder pain  Chronic left shoulder pain  Cervicalgia  Muscle weakness (generalized)     Problem List Patient Active Problem List   Diagnosis Date Noted  . Chronic right-sided low back pain 09/03/2019  . Abnormal blood cell count 07/30/2019  . Gross hematuria 07/30/2019  . Prostatitis 07/30/2019  . Elevated blood-pressure reading without diagnosis of hypertension 07/30/2019  . Elevated PSA 04/02/2019  . Drop in hemoglobin 04/02/2019  . Vaccine counseling 02/03/2019  . Abnormal EKG 11/28/2017  . Polyarthralgia 11/28/2017  . Encounter for health maintenance examination in adult 01/02/2016  . Special screening for malignant neoplasms, colon 01/02/2016  . Screening for prostate cancer 01/02/2016  . Benign prostatic hyperplasia 01/02/2016  . Nocturia 01/02/2016  . Lipoma of extremity 01/02/2016  . Epididymal cyst 07/07/2008  . Smoker 04/28/2008    Hilda Blades, PT, DPT, LAT, ATC 09/15/19  10:00 AM Phone: [phone] Fax: [place] [facility] [address] [place], [place], [address] Phone: [phone]   Fax:  [phone]  Name: Jorge Walker MRN: [id number] Date of Birth:  March 22, 1966

## 2019-09-16 ENCOUNTER — Other Ambulatory Visit: Payer: Managed Care, Other (non HMO)

## 2019-09-22 ENCOUNTER — Encounter: Payer: Self-pay | Admitting: Physical Therapy

## 2019-09-22 ENCOUNTER — Ambulatory Visit: Payer: 59 | Admitting: Physical Therapy

## 2019-09-22 ENCOUNTER — Other Ambulatory Visit: Payer: Self-pay

## 2019-09-22 DIAGNOSIS — M25512 Pain in left shoulder: Secondary | ICD-10-CM | POA: Diagnosis not present

## 2019-09-22 DIAGNOSIS — M542 Cervicalgia: Secondary | ICD-10-CM

## 2019-09-22 DIAGNOSIS — M25511 Pain in right shoulder: Secondary | ICD-10-CM

## 2019-09-22 DIAGNOSIS — G8929 Other chronic pain: Secondary | ICD-10-CM

## 2019-09-22 DIAGNOSIS — M6281 Muscle weakness (generalized): Secondary | ICD-10-CM

## 2019-09-22 NOTE — Therapy (Signed)
[place], [place], [address] Phone: [phone]   Fax:  [phone]  Physical Therapy Treatment  Progress Note Reporting Period 07/29/2019 to 09/22/2019  See note below for Objective Data and Assessment of Progress/Goals.     Patient Details  Name: Jorge Walker MRN: [id number] Date of Birth: 1966-06-11 Referring Provider (PT): Glade Lloyd Camelia Eng, PA-C   Encounter Date: 09/22/2019  PT End of Session - 09/22/19 1002    Visit Number  8    Number of Visits  16    Date for PT Re-Evaluation  11/17/19    Authorization Type  [employer]    PT Start Time  [phone]    PT Stop Time  1037    PT Time Calculation (min)  42 min    Activity Tolerance  Patient tolerated treatment well    Behavior During Therapy  [facility] for tasks assessed/performed       Past Medical History:  Diagnosis Date  . Allergy   . Arthritis    finger  . BPH (benign prostatic hypertrophy)   . Epididymal cyst   . Tobacco use disorder     Past Surgical History:  Procedure Laterality Date  . ARM WOUND REPAIR / CLOSURE     right upper posterior arm - teenager, s/p trauma  . ARTERY REPAIR     right upper arm - teenager, s/p trauma to right upper arm  . COLONOSCOPY  01/24/2016   Diverticulosis in left and right colon.   Repeat 10 years.  Dr. Scarlette Shorts  . ELBOW SURGERY  2019    There were no vitals filed for this visit.  Subjective Assessment - 09/22/19 0957    Subjective  Patient reports shoulder is feeling about the same. States that the right shoulder continues to bother him most especially when reaching back. Neck is feeling fairly good.    Patient Stated Goals  Reduce pain to improve ability to work and sleep    Currently in Pain?  Yes    Pain Score  4     Pain Location  Shoulder    Pain Orientation  Right;Left   R>L   Pain Descriptors / Indicators  Aching;Sore    Pain Type  Chronic pain    Pain Onset  More than a  month ago    Pain Frequency  Intermittent         [facility] PT Assessment - 09/22/19 0001      Assessment   Medical Diagnosis  Neck and bilateral shoulder pain    Referring Provider (PT)  Tysinger, Camelia Eng, PA-C      Balance Screen   Has the patient fallen in the past 6 months  No      Prior Function   Level of Independence  Independent      Observation/Other Assessments   Focus on Therapeutic Outcomes (FOTO)   NA      Posture/Postural Control   Posture Comments  Patient continued to exhibited rounded shoulder and forward head posture, inferior angle and medial border winging at rest      AROM   Right Shoulder Flexion  170 Degrees    Right Shoulder ABduction  170 Degrees    Right Shoulder Internal Rotation  --   T8 - patient report concordant pain   Right Shoulder External Rotation  70 Degrees   T2   Left Shoulder Flexion  170 Degrees    Left Shoulder ABduction  170 Degrees  Left Shoulder Internal Rotation  --   T6   Left Shoulder External Rotation  70 Degrees   T4     Strength   Right Shoulder Flexion  5/5    Right Shoulder ABduction  5/5    Right Shoulder Internal Rotation  5/5    Right Shoulder External Rotation  4/5   patient reports concordant pain   Left Shoulder Flexion  5/5    Left Shoulder ABduction  5/5    Left Shoulder Internal Rotation  5/5    Left Shoulder External Rotation  5/5      Palpation   Palpation comment  TTP with trigger points noted anterior deltoid and pec region, posterior cuff region all on right      Special Tests    Special Tests  Biceps/Labral Tests;Rotator Cuff Impingement    Rotator Cuff Impingment tests  Michel Bickers test    Biceps/Labral tests  [facility] Test      Hawkins-Kennedy test   Findings  Positive      Yergason's Test   Findings  Negative                   [facility] Adult PT Treatment/Exercise - 09/22/19 0001      Exercises   Exercises  Shoulder;Neck      Shoulder Exercises: Standing   External  Rotation  15 reps;Theraband    Theraband Level (Shoulder External Rotation)  Level 2 (Red)    Internal Rotation  15 reps;Theraband    Theraband Level (Shoulder Internal Rotation)  Level 2 (Red)    Extension  15 reps;Theraband    Theraband Level (Shoulder Extension)  Level 2 (Red)    Row  15 reps;Theraband    Theraband Level (Shoulder Row)  Level 3 (Green)      Shoulder Exercises: ROM/Strengthening   UBE (Upper Arm Bike)  L2 x5 min (fwd/bwd)      Manual Therapy   Manual Therapy  Soft tissue mobilization;Myofascial release;Passive ROM;Joint mobilization    Joint Mobilization  Posterior GHJ     Soft tissue mobilization  Right shoulder    Myofascial Release  Anterior deltoid, pec major, bicep, posterior cuff    Passive ROM  Supine posterior cuff stretch with scap pinned             PT Education - 09/22/19 1001    Education Details  HEP    Person(s) Educated  Patient    Methods  Explanation;Demonstration;Verbal cues    Comprehension  Verbalized understanding;Returned demonstration;Verbal cues required;Need further instruction       PT Short Term Goals - 09/22/19 1046      PT SHORT TERM GOAL #1   Title  Patient will be independent with HEP to maintain progress in PT    Time  4    Period  Weeks    Status  Achieved    Target Date  --      PT SHORT TERM GOAL #2   Title  Patient will report < or = 2-3/10 bilateral shoulder pain while reaching behind his back    Baseline  Report continued right shoulder pain    Time  4    Period  Weeks    Status  Revised    Target Date  10/20/19      PT SHORT TERM GOAL #3   Title  Patient will exhibit improved posture to reduce stress on shoulders/neck and improve lifting ability    Baseline  Continues to exhibit poor  posture    Time  4    Period  Weeks    Status  Revised    Target Date  10/20/19        PT Long Term Goals - 09/22/19 1048      PT LONG TERM GOAL #1   Title  Patient will exhibit gross UE strength 4+/5 to improve  lifting ability    Time  8    Period  Weeks    Status  Revised    Target Date  11/17/19      PT LONG TERM GOAL #2   Title  Patient will report < or = 1-2/10 pain with lifting and reaching behind his back    Time  8    Period  Weeks    Status  Revised    Target Date  11/17/19      PT LONG TERM GOAL #3   Title  Patient will report no limitation with lying on his side to improve sleeping.    Baseline  Continues to report sleep disturbances lying on right side    Time  8    Period  Weeks    Status  Revised    Target Date  11/17/19      PT LONG TERM GOAL #4   Title  Patient will report improved function of FOTO < or = 28% limitation    Time  8    Period  Weeks    Target Date  11/17/19            Plan - 09/22/19 1002    Clinical Impression Statement  Patient continues to have R > L shoulder pain that seems mostly consistent with rotator cuff tendinopathy but does have some muscular pain/tightness around the shoulder that could be contributing to his pain. He respond well to manual therapy initially but states pain continues to come back. He was encouraged to maintain consistency with rotator cuff strengthening exercises and periscapular strengthening to improve postural and scapular control with activity. He would benefit from continued skilled PT to reduce pain with activity through imrpovement in strength and control.    Rehab Potential  Good    PT Frequency  1x / week    PT Duration  8 weeks    PT Treatment/Interventions  ADLs/Self Care Home Management;Cryotherapy;Electrical Stimulation;Moist Heat;Iontophoresis 4mg /ml Dexamethasone;Ultrasound;Therapeutic exercise;Therapeutic activities;Neuromuscular re-education;Patient/family education;Dry needling;Passive range of motion;Manual techniques;Taping;Spinal Manipulations;Joint Manipulations    PT Next Visit Plan  Assess HEP, manual for shoulder, thoracic and cervical PRN; progress periscapular and rotator cuff strength and control,  serratus strengthening    PT Home Exercise Plan  Banded row with green, extension with red, ER/IR with red, serratus hug with red; seated upper trap and levator stretching, seated chin tuck, doorway pec stretch, ball STM for upper trap/levator/posterior cuff, cross body stretch against wall    Consulted and Agree with Plan of Care  Patient       Patient will benefit from skilled therapeutic intervention in order to improve the following deficits and impairments:  Decreased activity tolerance, Decreased strength, Impaired flexibility, Postural dysfunction, Pain  Visit Diagnosis: Chronic right shoulder pain  Chronic left shoulder pain  Cervicalgia  Muscle weakness (generalized)     Problem List Patient Active Problem List   Diagnosis Date Noted  . Chronic right-sided low back pain 09/03/2019  . Abnormal blood cell count 07/30/2019  . Gross hematuria 07/30/2019  . Prostatitis 07/30/2019  . Elevated blood-pressure reading without diagnosis of hypertension 07/30/2019  .  Elevated PSA 04/02/2019  . Drop in hemoglobin 04/02/2019  . Vaccine counseling 02/03/2019  . Abnormal EKG 11/28/2017  . Polyarthralgia 11/28/2017  . Encounter for health maintenance examination in adult 01/02/2016  . Special screening for malignant neoplasms, colon 01/02/2016  . Screening for prostate cancer 01/02/2016  . Benign prostatic hyperplasia 01/02/2016  . Nocturia 01/02/2016  . Lipoma of extremity 01/02/2016  . Epididymal cyst 07/07/2008  . Smoker 04/28/2008    Hilda Blades, PT, DPT, LAT, ATC 09/22/19  11:04 AM Phone: [phone] Fax: [facility] [address] [place], [place], [address] Phone: [phone]   Fax:  [phone]  Name: JUNIE GULBRANDSON MRN: [id number] Date of Birth: 1966/03/18

## 2019-09-23 DIAGNOSIS — Z0279 Encounter for issue of other medical certificate: Secondary | ICD-10-CM

## 2019-09-27 ENCOUNTER — Telehealth: Payer: Self-pay

## 2019-09-27 NOTE — Telephone Encounter (Signed)
Patient called inquiring about ct scan. Patient was informed that provider wants him to see [facility] and let them decide if he needs ct scan done. Patient has appointment scheduled for 09/30/19.

## 2019-09-27 NOTE — Telephone Encounter (Signed)
If he already has appointment with urologist then lets defer to them as we are having a fairly hard time getting scans covered

## 2019-09-29 ENCOUNTER — Other Ambulatory Visit: Payer: Self-pay

## 2019-09-29 ENCOUNTER — Encounter: Payer: Self-pay | Admitting: Physical Therapy

## 2019-09-29 ENCOUNTER — Ambulatory Visit: Payer: 59 | Attending: Medical | Admitting: Physical Therapy

## 2019-09-29 DIAGNOSIS — M542 Cervicalgia: Secondary | ICD-10-CM

## 2019-09-29 DIAGNOSIS — M25512 Pain in left shoulder: Secondary | ICD-10-CM | POA: Insufficient documentation

## 2019-09-29 DIAGNOSIS — M25511 Pain in right shoulder: Secondary | ICD-10-CM | POA: Diagnosis present

## 2019-09-29 DIAGNOSIS — G8929 Other chronic pain: Secondary | ICD-10-CM

## 2019-09-29 DIAGNOSIS — M6281 Muscle weakness (generalized): Secondary | ICD-10-CM | POA: Diagnosis present

## 2019-09-29 NOTE — Therapy (Signed)
[place], [place], [address] Phone: [phone]   Fax:  [phone]  Physical Therapy Treatment  Patient Details  Name: Jorge Walker MRN: [id number] Date of Birth: Dec 29, 1965 Referring Provider (PT): Glade Lloyd Camelia Eng, PA-C   Encounter Date: 09/29/2019  PT End of Session - 09/29/19 1004    Visit Number  9    Number of Visits  16    Date for PT Re-Evaluation  11/17/19    Authorization Type  [employer]    PT Start Time  1000    PT Stop Time  1040    PT Time Calculation (min)  40 min    Activity Tolerance  Patient tolerated treatment well    Behavior During Therapy  [facility] for tasks assessed/performed       Past Medical History:  Diagnosis Date  . Allergy   . Arthritis    finger  . BPH (benign prostatic hypertrophy)   . Epididymal cyst   . Tobacco use disorder     Past Surgical History:  Procedure Laterality Date  . ARM WOUND REPAIR / CLOSURE     right upper posterior arm - teenager, s/p trauma  . ARTERY REPAIR     right upper arm - teenager, s/p trauma to right upper arm  . COLONOSCOPY  01/24/2016   Diverticulosis in left and right colon.   Repeat 10 years.  Dr. Scarlette Shorts  . ELBOW SURGERY  2019    There were no vitals filed for this visit.  Subjective Assessment - 09/29/19 1002    Subjective  Patient reports over the past few days both of his shoulder were hurting more with no apparent reason. He used icy hot to help dull the pain. Currently he is feeling about the same.    Patient Stated Goals  Reduce pain to improve ability to work and sleep    Currently in Pain?  Yes    Pain Score  6     Pain Location  Shoulder    Pain Orientation  Right;Left   R>L   Pain Descriptors / Indicators  Aching;Sore    Pain Type  Chronic pain    Pain Onset  More than a month ago    Pain Frequency  Intermittent                       [facility] Adult PT Treatment/Exercise - 09/29/19  0001      Exercises   Exercises  Shoulder;Neck      Shoulder Exercises: Prone   Other Prone Exercises  Quadruped serratus press 2x10      Shoulder Exercises: Standing   External Rotation  20 reps   2 sets   Theraband Level (Shoulder External Rotation)  Level 2 (Red);Level 1 (Yellow)    External Rotation Limitations  no money with scap retraction    Internal Rotation  20 reps   2 sets   Theraband Level (Shoulder Internal Rotation)  Level 2 (Red)    Extension  15 reps   2 sets   Theraband Level (Shoulder Extension)  Level 2 (Red)    Row  15 reps   2 sets   Theraband Level (Shoulder Row)  Level 3 (Green)    Shoulder Elevation  15 reps   2 sets   Shoulder Elevation Limitations  scaption with 3#      Shoulder Exercises: ROM/Strengthening   UBE (Upper Arm Bike)  L2 x5 min (  fwd/bwd)             PT Education - 09/29/19 1004    Education Details  HEP    Person(s) Educated  Patient    Methods  Explanation;Demonstration;Verbal cues    Comprehension  Verbalized understanding;Returned demonstration;Verbal cues required;Need further instruction       PT Short Term Goals - 09/22/19 1046      PT SHORT TERM GOAL #1   Title  Patient will be independent with HEP to maintain progress in PT    Time  4    Period  Weeks    Status  Achieved    Target Date  --      PT SHORT TERM GOAL #2   Title  Patient will report < or = 2-3/10 bilateral shoulder pain while reaching behind his back    Baseline  Report continued right shoulder pain    Time  4    Period  Weeks    Status  Revised    Target Date  10/20/19      PT SHORT TERM GOAL #3   Title  Patient will exhibit improved posture to reduce stress on shoulders/neck and improve lifting ability    Baseline  Continues to exhibit poor posture    Time  4    Period  Weeks    Status  Revised    Target Date  10/20/19        PT Long Term Goals - 09/22/19 1048      PT LONG TERM GOAL #1   Title  Patient will exhibit gross UE  strength 4+/5 to improve lifting ability    Time  8    Period  Weeks    Status  Revised    Target Date  11/17/19      PT LONG TERM GOAL #2   Title  Patient will report < or = 1-2/10 pain with lifting and reaching behind his back    Time  8    Period  Weeks    Status  Revised    Target Date  11/17/19      PT LONG TERM GOAL #3   Title  Patient will report no limitation with lying on his side to improve sleeping.    Baseline  Continues to report sleep disturbances lying on right side    Time  8    Period  Weeks    Status  Revised    Target Date  11/17/19      PT LONG TERM GOAL #4   Title  Patient will report improved function of FOTO < or = 28% limitation    Time  8    Period  Weeks    Target Date  11/17/19            Plan - 09/29/19 1005    Clinical Impression Statement  Patient continues to exhibit bilat shoulder pain with symptoms that seem consistent with RTC tendinopathy/impingement. Todays visit focused on banded rotator cuff and periscapular strengthening to progress strength and improve activity tolerance. He requires cueing for proper posture and scapular control with exercises. He would benefit from continued skilled PT to improve pain with activity.    PT Treatment/Interventions  ADLs/Self Care Home Management;Cryotherapy;Electrical Stimulation;Moist Heat;Iontophoresis 4mg /ml Dexamethasone;Ultrasound;Therapeutic exercise;Therapeutic activities;Neuromuscular re-education;Patient/family education;Dry needling;Passive range of motion;Manual techniques;Taping;Spinal Manipulations;Joint Manipulations    PT Next Visit Plan  Assess HEP, manual for shoulder, thoracic and cervical PRN; progress periscapular and rotator cuff strength and control, serratus strengthening  PT Home Exercise Plan  Banded row with green, extension with red, ER/IR with red, serratus hug with red; seated upper trap and levator stretching, seated chin tuck, doorway pec stretch, ball STM for upper  trap/levator/posterior cuff, cross body stretch against wall    Consulted and Agree with Plan of Care  Patient       Patient will benefit from skilled therapeutic intervention in order to improve the following deficits and impairments:  Decreased activity tolerance, Decreased strength, Impaired flexibility, Postural dysfunction, Pain  Visit Diagnosis: Chronic right shoulder pain  Chronic left shoulder pain  Cervicalgia  Muscle weakness (generalized)     Problem List Patient Active Problem List   Diagnosis Date Noted  . Chronic right-sided low back pain 09/03/2019  . Abnormal blood cell count 07/30/2019  . Gross hematuria 07/30/2019  . Prostatitis 07/30/2019  . Elevated blood-pressure reading without diagnosis of hypertension 07/30/2019  . Elevated PSA 04/02/2019  . Drop in hemoglobin 04/02/2019  . Vaccine counseling 02/03/2019  . Abnormal EKG 11/28/2017  . Polyarthralgia 11/28/2017  . Encounter for health maintenance examination in adult 01/02/2016  . Special screening for malignant neoplasms, colon 01/02/2016  . Screening for prostate cancer 01/02/2016  . Benign prostatic hyperplasia 01/02/2016  . Nocturia 01/02/2016  . Lipoma of extremity 01/02/2016  . Epididymal cyst 07/07/2008  . Smoker 04/28/2008    Hilda Blades, PT, DPT, LAT, ATC 09/29/19  10:40 AM Phone: [phone] Fax: [facility] [address] [place], [place], [address] Phone: [phone]   Fax:  [phone]  Name: Jorge Walker MRN: [id number] Date of Birth: Jul 20, 1966

## 2019-10-08 ENCOUNTER — Ambulatory Visit: Payer: 59 | Admitting: Physical Therapy

## 2019-10-08 ENCOUNTER — Encounter: Payer: Self-pay | Admitting: Physical Therapy

## 2019-10-08 ENCOUNTER — Other Ambulatory Visit: Payer: Self-pay

## 2019-10-08 DIAGNOSIS — M25511 Pain in right shoulder: Secondary | ICD-10-CM | POA: Diagnosis not present

## 2019-10-08 DIAGNOSIS — M25512 Pain in left shoulder: Secondary | ICD-10-CM

## 2019-10-08 DIAGNOSIS — M6281 Muscle weakness (generalized): Secondary | ICD-10-CM

## 2019-10-08 DIAGNOSIS — M542 Cervicalgia: Secondary | ICD-10-CM

## 2019-10-08 DIAGNOSIS — G8929 Other chronic pain: Secondary | ICD-10-CM

## 2019-10-08 NOTE — Therapy (Signed)
[place], [place], [address] Phone: [phone]   Fax:  [phone]  Physical Therapy Treatment  Patient Details  Name: Jorge Walker MRN: [id number] Date of Birth: [birth year] Referring Provider (PT): Glade Lloyd Camelia Eng, PA-C   Encounter Date: 10/08/2019  PT End of Session - 10/08/19 1049    Visit Number  10    Date for PT Re-Evaluation  11/17/19    Authorization Type  [employer]    PT Start Time  1045    PT Stop Time  1125    PT Time Calculation (min)  40 min    Activity Tolerance  Patient tolerated treatment well    Behavior During Therapy  [facility] for tasks assessed/performed       Past Medical History:  Diagnosis Date  . Allergy   . Arthritis    finger  . BPH (benign prostatic hypertrophy)   . Epididymal cyst   . Tobacco use disorder     Past Surgical History:  Procedure Laterality Date  . ARM WOUND REPAIR / CLOSURE     right upper posterior arm - teenager, s/p trauma  . ARTERY REPAIR     right upper arm - teenager, s/p trauma to right upper arm  . COLONOSCOPY  01/24/2016   Diverticulosis in left and right colon.   Repeat 10 years.  Dr. Scarlette Shorts  . ELBOW SURGERY  2019    There were no vitals filed for this visit.  Subjective Assessment - 10/08/19 1048    Subjective  Patient reports his pain is the same.    Patient Stated Goals  Reduce pain to improve ability to work and sleep    Currently in Pain?  Yes    Pain Score  7     Pain Location  Shoulder    Pain Orientation  Right;Left   R > L   Pain Descriptors / Indicators  Aching;Sore    Pain Type  Chronic pain    Pain Onset  More than a month ago    Pain Frequency  Intermittent                       [facility] Adult PT Treatment/Exercise - 10/08/19 0001      Exercises   Exercises  Shoulder;Neck      Shoulder Exercises: Supine   Protraction  20 reps   2 sets   Protraction Weight (lbs)  8      Shoulder  Exercises: Standing   Horizontal ABduction  20 reps   2 sets   Theraband Level (Shoulder Horizontal ABduction)  Level 2 (Red)    Horizontal ABduction Limitations  palms up    External Rotation  20 reps   2 sets   Theraband Level (Shoulder External Rotation)  Level 2 (Red)    External Rotation Limitations  no money    Internal Rotation  20 reps   2 sets   Theraband Level (Shoulder Internal Rotation)  Level 2 (Red)    Extension  20 reps   2 sets   Theraband Level (Shoulder Extension)  Level 2 (Red)    Row  20 reps   2 sets   Theraband Level (Shoulder Row)  Level 4 (Blue)    Shoulder Elevation Limitations  2x20 scaption with 2#      Shoulder Exercises: ROM/Strengthening   UBE (Upper Arm Bike)  L2 x4 min (2 fwd/bwd)  PT Education - 10/08/19 1049    Education Details  HEP    Person(s) Educated  Patient    Methods  Explanation;Demonstration;Verbal cues    Comprehension  Verbalized understanding;Returned demonstration;Verbal cues required;Need further instruction       PT Short Term Goals - 09/22/19 1046      PT SHORT TERM GOAL #1   Title  Patient will be independent with HEP to maintain progress in PT    Time  4    Period  Weeks    Status  Achieved    Target Date  --      PT SHORT TERM GOAL #2   Title  Patient will report < or = 2-3/10 bilateral shoulder pain while reaching behind his back    Baseline  Report continued right shoulder pain    Time  4    Period  Weeks    Status  Revised    Target Date  10/20/19      PT SHORT TERM GOAL #3   Title  Patient will exhibit improved posture to reduce stress on shoulders/neck and improve lifting ability    Baseline  Continues to exhibit poor posture    Time  4    Period  Weeks    Status  Revised    Target Date  10/20/19        PT Long Term Goals - 09/22/19 1048      PT LONG TERM GOAL #1   Title  Patient will exhibit gross UE strength 4+/5 to improve lifting ability    Time  8    Period  Weeks     Status  Revised    Target Date  11/17/19      PT LONG TERM GOAL #2   Title  Patient will report < or = 1-2/10 pain with lifting and reaching behind his back    Time  8    Period  Weeks    Status  Revised    Target Date  11/17/19      PT LONG TERM GOAL #3   Title  Patient will report no limitation with lying on his side to improve sleeping.    Baseline  Continues to report sleep disturbances lying on right side    Time  8    Period  Weeks    Status  Revised    Target Date  11/17/19      PT LONG TERM GOAL #4   Title  Patient will report improved function of FOTO < or = 28% limitation    Time  8    Period  Weeks    Target Date  11/17/19            Plan - 10/08/19 1127    Clinical Impression Statement  Patient continues to have bilateral shoulder pain with activity. He is progressing with resistance for rotator cuff and periscapular strengthening but continues to exhibit rounded shoulder posture and scapular dyskinesis that is most likely contributing to symptoms of rotator cuff tendinopathy. He has not had any complaints related to his neck recently. He would benefit from continued skilled PT to progress his postural control and activity tolerance to improve pain with activity.    PT Treatment/Interventions  ADLs/Self Care Home Management;Cryotherapy;Electrical Stimulation;Moist Heat;Iontophoresis 4mg /ml Dexamethasone;Ultrasound;Therapeutic exercise;Therapeutic activities;Neuromuscular re-education;Patient/family education;Dry needling;Passive range of motion;Manual techniques;Taping;Spinal Manipulations;Joint Manipulations    PT Next Visit Plan  Assess HEP, manual for shoulder, thoracic and cervical PRN; progress periscapular and rotator cuff strength  and control, serratus strengthening    PT Home Exercise Plan  Banded row with green, extension with red, ER/IR with red, serratus hug with red; seated upper trap and levator stretching, seated chin tuck, doorway pec stretch, ball STM  for upper trap/levator/posterior cuff, cross body stretch against wall    Consulted and Agree with Plan of Care  Patient       Patient will benefit from skilled therapeutic intervention in order to improve the following deficits and impairments:  Decreased activity tolerance, Decreased strength, Impaired flexibility, Postural dysfunction, Pain  Visit Diagnosis: Chronic left shoulder pain  Chronic right shoulder pain  Cervicalgia  Muscle weakness (generalized)     Problem List Patient Active Problem List   Diagnosis Date Noted  . Chronic right-sided low back pain 09/03/2019  . Abnormal blood cell count 07/30/2019  . Gross hematuria 07/30/2019  . Prostatitis 07/30/2019  . Elevated blood-pressure reading without diagnosis of hypertension 07/30/2019  . Elevated PSA 04/02/2019  . Drop in hemoglobin 04/02/2019  . Vaccine counseling 02/03/2019  . Abnormal EKG 11/28/2017  . Polyarthralgia 11/28/2017  . Encounter for health maintenance examination in adult 01/02/2016  . Special screening for malignant neoplasms, colon 01/02/2016  . Screening for prostate cancer 01/02/2016  . Benign prostatic hyperplasia 01/02/2016  . Nocturia 01/02/2016  . Lipoma of extremity 01/02/2016  . Epididymal cyst 07/07/2008  . Smoker 04/28/2008    Hilda Blades, PT, DPT, LAT, ATC 10/08/19  11:31 AM Phone: [phone] Fax: [place] [facility] [address] [place], [place], [address] Phone: [phone]   Fax:  [phone]  Name: Jorge Walker MRN: [id number] Date of Birth: May 29, 1966

## 2019-10-13 ENCOUNTER — Other Ambulatory Visit: Payer: Self-pay

## 2019-10-13 ENCOUNTER — Encounter: Payer: Self-pay | Admitting: Physical Therapy

## 2019-10-13 ENCOUNTER — Ambulatory Visit: Payer: 59 | Admitting: Physical Therapy

## 2019-10-13 DIAGNOSIS — G8929 Other chronic pain: Secondary | ICD-10-CM

## 2019-10-13 DIAGNOSIS — M542 Cervicalgia: Secondary | ICD-10-CM

## 2019-10-13 DIAGNOSIS — M25511 Pain in right shoulder: Secondary | ICD-10-CM

## 2019-10-13 DIAGNOSIS — M6281 Muscle weakness (generalized): Secondary | ICD-10-CM

## 2019-10-13 DIAGNOSIS — M25512 Pain in left shoulder: Secondary | ICD-10-CM

## 2019-10-13 NOTE — Therapy (Signed)
[place], [place], [address] Phone: [phone]   Fax:  [phone]  Physical Therapy Treatment  Patient Details  Name: Jorge Walker MRN: [id number] Date of Birth: April 28, 1966 Referring Provider (PT): Glade Lloyd Camelia Eng, PA-C   Encounter Date: 10/13/2019  PT End of Session - 10/13/19 1045    Visit Number  11    Number of Visits  16    Date for PT Re-Evaluation  11/17/19    Authorization Type  [employer]    PT Start Time  1042    PT Stop Time  1125    PT Time Calculation (min)  43 min    Activity Tolerance  Patient tolerated treatment well    Behavior During Therapy  [facility] for tasks assessed/performed       Past Medical History:  Diagnosis Date  . Allergy   . Arthritis    finger  . BPH (benign prostatic hypertrophy)   . Epididymal cyst   . Tobacco use disorder     Past Surgical History:  Procedure Laterality Date  . ARM WOUND REPAIR / CLOSURE     right upper posterior arm - teenager, s/p trauma  . ARTERY REPAIR     right upper arm - teenager, s/p trauma to right upper arm  . COLONOSCOPY  01/24/2016   Diverticulosis in left and right colon.   Repeat 10 years.  Dr. Scarlette Shorts  . ELBOW SURGERY  2019    There were no vitals filed for this visit.  Subjective Assessment - 10/13/19 1042    Subjective  Patient reports no changes since last visit.    Patient Stated Goals  Reduce pain to improve ability to work and sleep    Currently in Pain?  Yes    Pain Score  6     Pain Location  Shoulder    Pain Orientation  Right;Left    Pain Descriptors / Indicators  Aching;Sore    Pain Type  Chronic pain    Pain Onset  More than a month ago    Pain Frequency  Intermittent                       [facility] Adult PT Treatment/Exercise - 10/13/19 0001      Exercises   Exercises  Shoulder;Neck      Shoulder Exercises: Supine   Protraction  20 reps   2 sets   Protraction Weight  (lbs)  9      Shoulder Exercises: Standing   Horizontal ABduction  20 reps   2 sets   Theraband Level (Shoulder Horizontal ABduction)  Level 3 (Green)    Horizontal ABduction Limitations  palms up    External Rotation  20 reps   2 sets   Theraband Level (Shoulder External Rotation)  Level 3 (Green)    External Rotation Limitations  no money    Internal Rotation  20 reps   2 sets   Theraband Level (Shoulder Internal Rotation)  Level 3 (Green)    Extension  20 reps   2 sets   Theraband Level (Shoulder Extension)  Level 3 (Green)    Row  20 reps   2 sets   Theraband Level (Shoulder Row)  Level 4 (Blue)    Shoulder Elevation Limitations  2x20 scaption with 3#      Shoulder Exercises: ROM/Strengthening   UBE (Upper Arm Bike)  L3 x4 min (2 fwd/bwd)  Shoulder Exercises: Stretch   Corner Stretch  3 reps;20 seconds    Corner Stretch Limitations  doorway pec stretch             PT Education - 10/13/19 1043    Education Details  HEP    Person(s) Educated  Patient    Methods  Explanation;Demonstration;Verbal cues;Tactile cues    Comprehension  Verbalized understanding;Returned demonstration;Verbal cues required;Tactile cues required;Need further instruction       PT Short Term Goals - 09/22/19 1046      PT SHORT TERM GOAL #1   Title  Patient will be independent with HEP to maintain progress in PT    Time  4    Period  Weeks    Status  Achieved    Target Date  --      PT SHORT TERM GOAL #2   Title  Patient will report < or = 2-3/10 bilateral shoulder pain while reaching behind his back    Baseline  Report continued right shoulder pain    Time  4    Period  Weeks    Status  Revised    Target Date  10/20/19      PT SHORT TERM GOAL #3   Title  Patient will exhibit improved posture to reduce stress on shoulders/neck and improve lifting ability    Baseline  Continues to exhibit poor posture    Time  4    Period  Weeks    Status  Revised    Target Date  10/20/19         PT Long Term Goals - 09/22/19 1048      PT LONG TERM GOAL #1   Title  Patient will exhibit gross UE strength 4+/5 to improve lifting ability    Time  8    Period  Weeks    Status  Revised    Target Date  11/17/19      PT LONG TERM GOAL #2   Title  Patient will report < or = 1-2/10 pain with lifting and reaching behind his back    Time  8    Period  Weeks    Status  Revised    Target Date  11/17/19      PT LONG TERM GOAL #3   Title  Patient will report no limitation with lying on his side to improve sleeping.    Baseline  Continues to report sleep disturbances lying on right side    Time  8    Period  Weeks    Status  Revised    Target Date  11/17/19      PT LONG TERM GOAL #4   Title  Patient will report improved function of FOTO < or = 28% limitation    Time  8    Period  Weeks    Target Date  11/17/19            Plan - 10/13/19 1047    Clinical Impression Statement  Patient is progressing with his strengthening exercises and tolerating the increase in resistance without increase in pain from baseline. He continues to have high level of bilateral shoulder pain with activity. He requires consistent cueing for posture with exercises and he exhibits scapular dyskinesis witth inferior angle winging. He would benefit from continued skilled PT to progress his postural control and activity tolerance to improve pain with activity.    PT Treatment/Interventions  ADLs/Self Care Home Management;Cryotherapy;Electrical Stimulation;Moist Heat;Iontophoresis 4mg /ml Dexamethasone;Ultrasound;Therapeutic exercise;Therapeutic activities;Neuromuscular  re-education;Patient/family education;Dry needling;Passive range of motion;Manual techniques;Taping;Spinal Manipulations;Joint Manipulations    PT Next Visit Plan  Assess HEP, manual for shoulder, thoracic and cervical PRN; progress periscapular and rotator cuff strength and control, serratus strengthening    PT Home Exercise Plan  Banded  row with green, extension with red, ER/IR with red, serratus hug with red; seated upper trap and levator stretching, seated chin tuck, doorway pec stretch, ball STM for upper trap/levator/posterior cuff, cross body stretch against wall    Consulted and Agree with Plan of Care  Patient       Patient will benefit from skilled therapeutic intervention in order to improve the following deficits and impairments:  Decreased activity tolerance, Decreased strength, Impaired flexibility, Postural dysfunction, Pain  Visit Diagnosis: Chronic left shoulder pain  Chronic right shoulder pain  Cervicalgia  Muscle weakness (generalized)     Problem List Patient Active Problem List   Diagnosis Date Noted  . Chronic right-sided low back pain 09/03/2019  . Abnormal blood cell count 07/30/2019  . Gross hematuria 07/30/2019  . Prostatitis 07/30/2019  . Elevated blood-pressure reading without diagnosis of hypertension 07/30/2019  . Elevated PSA 04/02/2019  . Drop in hemoglobin 04/02/2019  . Vaccine counseling 02/03/2019  . Abnormal EKG 11/28/2017  . Polyarthralgia 11/28/2017  . Encounter for health maintenance examination in adult 01/02/2016  . Special screening for malignant neoplasms, colon 01/02/2016  . Screening for prostate cancer 01/02/2016  . Benign prostatic hyperplasia 01/02/2016  . Nocturia 01/02/2016  . Lipoma of extremity 01/02/2016  . Epididymal cyst 07/07/2008  . Smoker 04/28/2008    Hilda Blades, PT, DPT, LAT, ATC 10/13/19  11:25 AM Phone: [phone] Fax: [place] [facility] [address] [place], [place], [address] Phone: [phone]   Fax:  [phone]  Name: Jorge Walker MRN: [id number] Date of Birth: 07/07/1966

## 2019-10-20 ENCOUNTER — Other Ambulatory Visit: Payer: Self-pay

## 2019-10-20 ENCOUNTER — Encounter: Payer: Self-pay | Admitting: Physical Therapy

## 2019-10-20 ENCOUNTER — Ambulatory Visit: Payer: 59 | Admitting: Physical Therapy

## 2019-10-20 DIAGNOSIS — G8929 Other chronic pain: Secondary | ICD-10-CM

## 2019-10-20 DIAGNOSIS — M25511 Pain in right shoulder: Secondary | ICD-10-CM | POA: Diagnosis not present

## 2019-10-20 DIAGNOSIS — M6281 Muscle weakness (generalized): Secondary | ICD-10-CM

## 2019-10-20 DIAGNOSIS — M25512 Pain in left shoulder: Secondary | ICD-10-CM

## 2019-10-20 DIAGNOSIS — M542 Cervicalgia: Secondary | ICD-10-CM

## 2019-10-20 NOTE — Therapy (Signed)
[place], [place], [address] Phone: [phone]   Fax:  [phone]  Physical Therapy Treatment  Patient Details  Name: Jorge Walker MRN: [id number] Date of Birth: 06/14/1966 Referring Provider (PT): Glade Lloyd Camelia Eng, PA-C   Encounter Date: 10/20/2019  PT End of Session - 10/20/19 1110    Visit Number  12    Number of Visits  16    Date for PT Re-Evaluation  11/17/19    Authorization Type  [employer]    PT Start Time  1045    PT Stop Time  1125    PT Time Calculation (min)  40 min    Activity Tolerance  Patient tolerated treatment well    Behavior During Therapy  [facility] for tasks assessed/performed       Past Medical History:  Diagnosis Date  . Allergy   . Arthritis    finger  . BPH (benign prostatic hypertrophy)   . Epididymal cyst   . Tobacco use disorder     Past Surgical History:  Procedure Laterality Date  . ARM WOUND REPAIR / CLOSURE     right upper posterior arm - teenager, s/p trauma  . ARTERY REPAIR     right upper arm - teenager, s/p trauma to right upper arm  . COLONOSCOPY  01/24/2016   Diverticulosis in left and right colon.   Repeat 10 years.  Dr. Scarlette Shorts  . ELBOW SURGERY  2019    There were no vitals filed for this visit.  Subjective Assessment - 10/20/19 1049    Subjective  Patient reports onset of discomfort from the shoulder to the outside of the upper arms on both sides.    Patient Stated Goals  Reduce pain to improve ability to work and sleep    Currently in Pain?  Yes    Pain Score  5     Pain Location  Shoulder    Pain Orientation  Right;Left    Pain Descriptors / Indicators  Discomfort;Aching;Sharp    Pain Type  Chronic pain    Pain Radiating Towards  lateral upper arm (2-3/10 pain level)    Pain Onset  More than a month ago    Pain Frequency  Intermittent         [facility] PT Assessment - 10/20/19 0001      AROM   Overall AROM Comments   Cervical AROM WNL and denies any radicular pain with overpressure applied, he did report upper trap tightness that was discordant    Right Shoulder Flexion  170 Degrees    Right Shoulder ABduction  170 Degrees    Left Shoulder Flexion  170 Degrees    Left Shoulder ABduction  170 Degrees      Strength   Right Shoulder Flexion  5/5    Right Shoulder ABduction  5/5    Right Shoulder Internal Rotation  5/5    Right Shoulder External Rotation  4/5   concordant pain   Left Shoulder Flexion  5/5    Left Shoulder ABduction  5/5    Left Shoulder Internal Rotation  5/5    Left Shoulder External Rotation  5/5      Special Tests    Special Tests  Cervical    Cervical Tests  Spurling's;Dictraction    Rotator Cuff Impingment tests  Neer impingement test;Hawkins- Kennedy test    Biceps/Labral tests  Speeds Test      Spurling's   Findings  Negative  Distraction Test   Findngs  Negative      Neer Impingement test    Findings  Positive      Hawkins-Kennedy test   Findings  Negative      Speeds test   findings  Positive                   [facility] Adult PT Treatment/Exercise - 10/20/19 0001      Self-Care   Self-Care  Other Self-Care Comments    Other Self-Care Comments   Shoulder referred pain vs. cervical referral      Exercises   Exercises  Shoulder;Neck      Shoulder Exercises: Supine   Protraction  20 reps   2 sets   Protraction Weight (lbs)  9      Shoulder Exercises: Prone   Other Prone Exercises  Prone Y lower trap 2x12      Shoulder Exercises: Standing   Horizontal ABduction  20 reps    Theraband Level (Shoulder Horizontal ABduction)  Level 2 (Red)    Horizontal ABduction Limitations  palms up    External Rotation  20 reps   2 sets   Theraband Level (Shoulder External Rotation)  Level 2 (Red)    External Rotation Limitations  no money    Extension  20 reps   2 sets   Theraband Level (Shoulder Extension)  Level 3 (Green)    Row  20 reps   2 sets    Theraband Level (Shoulder Row)  Level 4 (Blue)    Shoulder Elevation Limitations  2x20 scaption with 3#      Shoulder Exercises: Stretch   Corner Stretch  3 reps;20 seconds    Corner Stretch Limitations  doorway pec stretch             PT Education - 10/20/19 1110    Education Details  HEP    Person(s) Educated  Patient    Methods  Explanation;Demonstration;Verbal cues    Comprehension  Verbalized understanding;Returned demonstration;Verbal cues required;Need further instruction       PT Short Term Goals - 10/20/19 1140      PT SHORT TERM GOAL #1   Title  Patient will be independent with HEP to maintain progress in PT    Time  4    Period  Weeks    Status  Achieved      PT SHORT TERM GOAL #2   Title  Patient will report < or = 2-3/10 bilateral shoulder pain while reaching behind his back    Baseline  Report continued right shoulder pain    Time  4    Period  Weeks    Status  On-going    Target Date  10/20/19      PT SHORT TERM GOAL #3   Title  Patient will exhibit improved posture to reduce stress on shoulders/neck and improve lifting ability    Baseline  Continues to exhibit poor posture    Time  4    Period  Weeks    Status  Not Met    Target Date  10/20/19        PT Long Term Goals - 09/22/19 1048      PT LONG TERM GOAL #1   Title  Patient will exhibit gross UE strength 4+/5 to improve lifting ability    Time  8    Period  Weeks    Status  Revised    Target Date  11/17/19  PT LONG TERM GOAL #2   Title  Patient will report < or = 1-2/10 pain with lifting and reaching behind his back    Time  8    Period  Weeks    Status  Revised    Target Date  11/17/19      PT LONG TERM GOAL #3   Title  Patient will report no limitation with lying on his side to improve sleeping.    Baseline  Continues to report sleep disturbances lying on right side    Time  8    Period  Weeks    Status  Revised    Target Date  11/17/19      PT LONG TERM GOAL #4    Title  Patient will report improved function of FOTO < or = 28% limitation    Time  8    Period  Weeks    Target Date  11/17/19            Plan - 10/20/19 1111    Clinical Impression Statement  Patient reported new onset of lateral shoulder pain that seems to be more related to rotator cuff/shoulder referral rather than cervical radiculapathy. His pain continues to seem consistent with rotator cuff tendinopathy, but he has had persistent pain with no true improvement so there may be an underlying pathology. He tolerated therapy well this visit and was given new band to increase resistance at home. He was encouraged to maintain consistency with exercises and focus on improved posture. He would benefit from continued skilled PT to improve symptoms and he was encouraged to contact ortho doctor for follow-up appointment.    PT Treatment/Interventions  ADLs/Self Care Home Management;Cryotherapy;Electrical Stimulation;Moist Heat;Iontophoresis 45m/ml Dexamethasone;Ultrasound;Therapeutic exercise;Therapeutic activities;Neuromuscular re-education;Patient/family education;Dry needling;Passive range of motion;Manual techniques;Taping;Spinal Manipulations;Joint Manipulations    PT Next Visit Plan  Assess HEP, manual for shoulder, thoracic and cervical PRN; progress periscapular and rotator cuff strength and control, serratus strengthening    PT Home Exercise Plan  Banded row with blue, extension with green, ER/IR with red, serratus hug with red; seated upper trap and levator stretching, seated chin tuck, doorway pec stretch, ball STM for upper trap/levator/posterior cuff, cross body stretch against wall    Consulted and Agree with Plan of Care  Patient       Patient will benefit from skilled therapeutic intervention in order to improve the following deficits and impairments:  Decreased activity tolerance, Decreased strength, Impaired flexibility, Postural dysfunction, Pain  Visit Diagnosis: Chronic left  shoulder pain  Chronic right shoulder pain  Cervicalgia  Muscle weakness (generalized)     Problem List Patient Active Problem List   Diagnosis Date Noted  . Chronic right-sided low back pain 09/03/2019  . Abnormal blood cell count 07/30/2019  . Gross hematuria 07/30/2019  . Prostatitis 07/30/2019  . Elevated blood-pressure reading without diagnosis of hypertension 07/30/2019  . Elevated PSA 04/02/2019  . Drop in hemoglobin 04/02/2019  . Vaccine counseling 02/03/2019  . Abnormal EKG 11/28/2017  . Polyarthralgia 11/28/2017  . Encounter for health maintenance examination in adult 01/02/2016  . Special screening for malignant neoplasms, colon 01/02/2016  . Screening for prostate cancer 01/02/2016  . Benign prostatic hyperplasia 01/02/2016  . Nocturia 01/02/2016  . Lipoma of extremity 01/02/2016  . Epididymal cyst 07/07/2008  . Smoker 04/28/2008    CHilda Blades PT, DPT, LAT, ATC 10/20/19  11:50 AM Phone: [phone]: [facility] [address]  Phone: [phone]   Fax:  [phone]  Name: DEQUINCY BORN MRN: [id number] Date of Birth: 03/07/1966

## 2019-10-27 ENCOUNTER — Encounter: Payer: Self-pay | Admitting: Physical Therapy

## 2019-10-27 ENCOUNTER — Other Ambulatory Visit: Payer: Self-pay

## 2019-10-27 ENCOUNTER — Ambulatory Visit: Payer: 59 | Attending: Medical | Admitting: Physical Therapy

## 2019-10-27 DIAGNOSIS — M25511 Pain in right shoulder: Secondary | ICD-10-CM | POA: Diagnosis present

## 2019-10-27 DIAGNOSIS — M25512 Pain in left shoulder: Secondary | ICD-10-CM | POA: Insufficient documentation

## 2019-10-27 DIAGNOSIS — M6281 Muscle weakness (generalized): Secondary | ICD-10-CM | POA: Diagnosis present

## 2019-10-27 DIAGNOSIS — G8929 Other chronic pain: Secondary | ICD-10-CM

## 2019-10-27 DIAGNOSIS — M542 Cervicalgia: Secondary | ICD-10-CM | POA: Diagnosis present

## 2019-10-27 NOTE — Therapy (Signed)
[facility] [address] [place], [place], [address] Phone: [phone]   Fax:  [phone]  Physical Therapy Treatment  Patient Details  Name: Jorge Walker MRN: [id number] Date of Birth: 01/20/1966 Referring Provider (PT): Tysinger, David S, PA-C   Encounter Date: 10/27/2019  PT End of Session - 10/27/19 1052    Visit Number  13    Number of Visits  16    Date for PT Re-Evaluation  11/17/19    Authorization Type  [employer]    PT Start Time  1045    PT Stop Time  1125    PT Time Calculation (min)  40 min    Activity Tolerance  Patient tolerated treatment well    Behavior During Therapy  WFL for tasks assessed/performed       Past Medical History:  Diagnosis Date  . Allergy   . Arthritis    finger  . BPH (benign prostatic hypertrophy)   . Epididymal cyst   . Tobacco use disorder     Past Surgical History:  Procedure Laterality Date  . ARM WOUND REPAIR / CLOSURE     right upper posterior arm - teenager, s/p trauma  . ARTERY REPAIR     right upper arm - teenager, s/p trauma to right upper arm  . COLONOSCOPY  01/24/2016   Diverticulosis in left and right colon.   Repeat 10 years.  Dr. John Orel  . ELBOW SURGERY  2019    There were no vitals filed for this visit.  Subjective Assessment - 10/27/19 1048    Subjective  Patient reports the pain he was having on the outside of his shoulders has gone away but he continues to have the bilateral shoulder pain, right greater than left.    Patient Stated Goals  Reduce pain to improve ability to work and sleep    Currently in Pain?  Yes    Pain Score  5     Pain Location  Shoulder    Pain Orientation  Right;Left   R>L   Pain Descriptors / Indicators  Aching;Sharp    Pain Type  Chronic pain    Pain Onset  More than a month ago    Pain Frequency  Intermittent    Aggravating Factors   reaching backward or behind back, sleeping on side                        [facility] Adult PT Treatment/Exercise - 10/27/19 0001      Exercises   Exercises  Shoulder;Neck      Shoulder Exercises: Standing   Horizontal ABduction  20 reps   2 sets   Theraband Level (Shoulder Horizontal ABduction)  Level 2 (Red)    External Rotation  20 reps   2 sets   Theraband Level (Shoulder External Rotation)  Level 3 (Green)    Internal Rotation  20 reps   2 sets   Theraband Level (Shoulder Internal Rotation)  Level 3 (Green)    Extension  20 reps   2 sets   Theraband Level (Shoulder Extension)  Level 3 (Green)    Row  20 reps   2 sets   Theraband Level (Shoulder Row)  Level 4 (Blue)    Shoulder Elevation Limitations  2x20 scaption with 3#      Shoulder Exercises: Stretch   Corner Stretch Limitations  3x20 sec doorway pec stretch    Internal Rotation Stretch Limitations    3x20 sec sleeper stretch             PT Education - 10/27/19 1051    Education Details  HEP, sleeping position    Person(s) Educated  Patient    Methods  Explanation;Demonstration;Verbal cues    Comprehension  Verbalized understanding;Returned demonstration;Verbal cues required;Need further instruction       PT Short Term Goals - 10/20/19 1140      PT SHORT TERM GOAL #1   Title  Patient will be independent with HEP to maintain progress in PT    Time  4    Period  Weeks    Status  Achieved      PT SHORT TERM GOAL #2   Title  Patient will report < or = 2-3/10 bilateral shoulder pain while reaching behind his back    Baseline  Report continued right shoulder pain    Time  4    Period  Weeks    Status  On-going    Target Date  10/20/19      PT SHORT TERM GOAL #3   Title  Patient will exhibit improved posture to reduce stress on shoulders/neck and improve lifting ability    Baseline  Continues to exhibit poor posture    Time  4    Period  Weeks    Status  Not Met    Target Date  10/20/19        PT Long Term Goals - 09/22/19 1048      PT  LONG TERM GOAL #1   Title  Patient will exhibit gross UE strength 4+/5 to improve lifting ability    Time  8    Period  Weeks    Status  Revised    Target Date  11/17/19      PT LONG TERM GOAL #2   Title  Patient will report < or = 1-2/10 pain with lifting and reaching behind his back    Time  8    Period  Weeks    Status  Revised    Target Date  11/17/19      PT LONG TERM GOAL #3   Title  Patient will report no limitation with lying on his side to improve sleeping.    Baseline  Continues to report sleep disturbances lying on right side    Time  8    Period  Weeks    Status  Revised    Target Date  11/17/19      PT LONG TERM GOAL #4   Title  Patient will report improved function of FOTO < or = 28% limitation    Time  8    Period  Weeks    Target Date  11/17/19            Plan - 10/27/19 1052    Clinical Impression Statement  Patient continues to report bilat shoulder pain. He does well with banded strengthening but does report increased pain with banded ER. His pain mainly occurs when reaching behind back. He was given a sleeper stretch to imporve posterior cuff pain. He would benefit from continued skilled PT to reduce pain with activity and improve sleeping ability.    PT Treatment/Interventions  ADLs/Self Care Home Management;Cryotherapy;Electrical Stimulation;Moist Heat;Iontophoresis 4mg/ml Dexamethasone;Ultrasound;Therapeutic exercise;Therapeutic activities;Neuromuscular re-education;Patient/family education;Dry needling;Passive range of motion;Manual techniques;Taping;Spinal Manipulations;Joint Manipulations    PT Next Visit Plan  Assess HEP, manual for shoulder, thoracic and cervical PRN; progress periscapular and rotator cuff strength and control, serratus strengthening      PT Home Exercise Plan  Banded row with blue, extension with green, ER/IR with red, serratus hug with red; seated upper trap and levator stretching, seated chin tuck, doorway pec stretch, ball STM for  upper trap/levator/posterior cuff, cross body stretch against wall or sleeper stretch    Consulted and Agree with Plan of Care  Patient       Patient will benefit from skilled therapeutic intervention in order to improve the following deficits and impairments:  Decreased activity tolerance, Decreased strength, Impaired flexibility, Postural dysfunction, Pain  Visit Diagnosis: Chronic left shoulder pain  Chronic right shoulder pain  Cervicalgia  Muscle weakness (generalized)     Problem List Patient Active Problem List   Diagnosis Date Noted  . Chronic right-sided low back pain 09/03/2019  . Abnormal blood cell count 07/30/2019  . Gross hematuria 07/30/2019  . Prostatitis 07/30/2019  . Elevated blood-pressure reading without diagnosis of hypertension 07/30/2019  . Elevated PSA 04/02/2019  . Drop in hemoglobin 04/02/2019  . Vaccine counseling 02/03/2019  . Abnormal EKG 11/28/2017  . Polyarthralgia 11/28/2017  . Encounter for health maintenance examination in adult 01/02/2016  . Special screening for malignant neoplasms, colon 01/02/2016  . Screening for prostate cancer 01/02/2016  . Benign prostatic hyperplasia 01/02/2016  . Nocturia 01/02/2016  . Lipoma of extremity 01/02/2016  . Epididymal cyst 07/07/2008  . Smoker 04/28/2008    Campbell Brown, PT, DPT, LAT, ATC 10/27/19  1:16 PM Phone: [phone] Fax: [phone]   [facility] [address] [place], [place], [address] Phone: [phone]   Fax:  [phone]  Name: Jorge Walker MRN: [id number] Date of Birth: 11/19/1965   

## 2019-11-03 ENCOUNTER — Encounter: Payer: Self-pay | Admitting: Physical Therapy

## 2019-11-03 ENCOUNTER — Other Ambulatory Visit: Payer: Self-pay

## 2019-11-03 ENCOUNTER — Ambulatory Visit: Payer: 59 | Admitting: Physical Therapy

## 2019-11-03 DIAGNOSIS — M25512 Pain in left shoulder: Secondary | ICD-10-CM

## 2019-11-03 DIAGNOSIS — M6281 Muscle weakness (generalized): Secondary | ICD-10-CM

## 2019-11-03 DIAGNOSIS — M25511 Pain in right shoulder: Secondary | ICD-10-CM

## 2019-11-03 DIAGNOSIS — G8929 Other chronic pain: Secondary | ICD-10-CM

## 2019-11-03 DIAGNOSIS — M542 Cervicalgia: Secondary | ICD-10-CM

## 2019-11-03 NOTE — Therapy (Signed)
[facility] [place], [place], [address] Phone: [phone]   Fax:  [phone]  Physical Therapy Treatment  Patient Details  Name: Jorge Walker MRN: [id number] Date of Birth: Aug 02, 1966 Referring Provider (PT): Glade Lloyd Camelia Eng, PA-C   Encounter Date: 11/03/2019  PT End of Session - 11/03/19 1043    Visit Number  14    Number of Visits  16    Date for PT Re-Evaluation  11/17/19    Authorization Type  [employer]    PT Start Time  1045    PT Stop Time  1125    PT Time Calculation (min)  40 min    Activity Tolerance  Patient tolerated treatment well    Behavior During Therapy  [facility] for tasks assessed/performed       Past Medical History:  Diagnosis Date  . Allergy   . Arthritis    finger  . BPH (benign prostatic hypertrophy)   . Epididymal cyst   . Tobacco use disorder     Past Surgical History:  Procedure Laterality Date  . ARM WOUND REPAIR / CLOSURE     right upper posterior arm - teenager, s/p trauma  . ARTERY REPAIR     right upper arm - teenager, s/p trauma to right upper arm  . COLONOSCOPY  01/24/2016   Diverticulosis in left and right colon.   Repeat 10 years.  Dr. Scarlette Shorts  . ELBOW SURGERY  2019    There were no vitals filed for this visit.  Subjective Assessment - 11/03/19 1042    Subjective  Patient reports he was sore following last visit. States the shoulders are feeling pretty good now. Still having trouble sleeping because he can't get comfortable.    Patient Stated Goals  Reduce pain to improve ability to work and sleep    Currently in Pain?  Yes    Pain Score  5     Pain Location  Shoulder    Pain Orientation  Right;Left    Pain Descriptors / Indicators  Aching;Sharp    Pain Type  Chronic pain    Pain Onset  More than a month ago    Pain Frequency  Intermittent    Aggravating Factors   reaching backward or behind back, sleeping on side    Pain Relieving Factors   Rest         [facility] PT Assessment - 11/03/19 0001      AROM   Right Shoulder Flexion  170 Degrees    Right Shoulder ABduction  170 Degrees    Right Shoulder Internal Rotation  --   T8   Right Shoulder External Rotation  --   T4   Left Shoulder Flexion  170 Degrees    Left Shoulder ABduction  170 Degrees    Left Shoulder Internal Rotation  --   T6   Left Shoulder External Rotation  --   T4     Strength   Right Shoulder Flexion  5/5    Right Shoulder ABduction  5/5    Right Shoulder Internal Rotation  5/5    Right Shoulder External Rotation  4/5   pt reports increased pain   Left Shoulder Flexion  5/5    Left Shoulder ABduction  5/5    Left Shoulder Internal Rotation  5/5    Left Shoulder External Rotation  5/5  [facility] Adult PT Treatment/Exercise - 11/03/19 0001      Exercises   Exercises  Shoulder;Neck      Shoulder Exercises: Standing   Horizontal ABduction  20 reps   2 sets   Theraband Level (Shoulder Horizontal ABduction)  Level 2 (Red)    External Rotation  20 reps   2 sets   Theraband Level (Shoulder External Rotation)  Level 2 (Red)    Internal Rotation  20 reps   2 sets   Theraband Level (Shoulder Internal Rotation)  Level 3 (Green)    Extension  20 reps   2 sets   Theraband Level (Shoulder Extension)  Level 3 (Green)    Row  20 reps   2 sets   Theraband Level (Shoulder Row)  Level 4 (Blue)    Shoulder Elevation Limitations  2x20 scaption with 3#    Other Standing Exercises  Banded Y with yellow 2x10    Other Standing Exercises  Push-up plus in plantigrade using table 2x10      Shoulder Exercises: ROM/Strengthening   UBE (Upper Arm Bike)  L3 x 4 min (2 fwd/bwd)      Shoulder Exercises: Stretch   Corner Stretch Limitations  3x20 sec doorway pec stretch    Internal Rotation Stretch Limitations  3x20 sec sleeper stretch             PT Education - 11/03/19 1042    Education Details  HEP    Person(s) Educated   Patient    Methods  Explanation;Demonstration;Verbal cues    Comprehension  Verbalized understanding;Returned demonstration;Verbal cues required;Need further instruction       PT Short Term Goals - 10/20/19 1140      PT SHORT TERM GOAL #1   Title  Patient will be independent with HEP to maintain progress in PT    Time  4    Period  Weeks    Status  Achieved      PT SHORT TERM GOAL #2   Title  Patient will report < or = 2-3/10 bilateral shoulder pain while reaching behind his back    Baseline  Report continued right shoulder pain    Time  4    Period  Weeks    Status  On-going    Target Date  10/20/19      PT SHORT TERM GOAL #3   Title  Patient will exhibit improved posture to reduce stress on shoulders/neck and improve lifting ability    Baseline  Continues to exhibit poor posture    Time  4    Period  Weeks    Status  Not Met    Target Date  10/20/19        PT Long Term Goals - 09/22/19 1048      PT LONG TERM GOAL #1   Title  Patient will exhibit gross UE strength 4+/5 to improve lifting ability    Time  8    Period  Weeks    Status  Revised    Target Date  11/17/19      PT LONG TERM GOAL #2   Title  Patient will report < or = 1-2/10 pain with lifting and reaching behind his back    Time  8    Period  Weeks    Status  Revised    Target Date  11/17/19      PT LONG TERM GOAL #3   Title  Patient will report no limitation with lying on  his side to improve sleeping.    Baseline  Continues to report sleep disturbances lying on right side    Time  8    Period  Weeks    Status  Revised    Target Date  11/17/19      PT LONG TERM GOAL #4   Title  Patient will report improved function of FOTO < or = 28% limitation    Time  8    Period  Weeks    Target Date  11/17/19            Plan - 11/03/19 1043    Clinical Impression Statement  Patient subjectively reporting improvement in symptoms but continues to report same shoulder pain. He only notes increased pain  level with resisted shoulder ER on right and when reaching behind his back, but has well maintai range of motion. He is doing well with shoulder strengthening and reports most discomfort with banded ER on right so reduced resistance to red band. He would benefit from continued skilled PT to reduce pain with activity and improve sleeping ability.    PT Treatment/Interventions  ADLs/Self Care Home Management;Cryotherapy;Electrical Stimulation;Moist Heat;Iontophoresis 46m/ml Dexamethasone;Ultrasound;Therapeutic exercise;Therapeutic activities;Neuromuscular re-education;Patient/family education;Dry needling;Passive range of motion;Manual techniques;Taping;Spinal Manipulations;Joint Manipulations    PT Next Visit Plan  Assess HEP, manual for shoulder, thoracic and cervical PRN; progress periscapular and rotator cuff strength and control, serratus strengthening    PT Home Exercise Plan  Banded row with blue, extension with green, ER/IR with red, serratus hug with red; seated upper trap and levator stretching, seated chin tuck, doorway pec stretch, ball STM for upper trap/levator/posterior cuff, cross body stretch against wall or sleeper stretch    Consulted and Agree with Plan of Care  Patient       Patient will benefit from skilled therapeutic intervention in order to improve the following deficits and impairments:  Decreased activity tolerance, Decreased strength, Impaired flexibility, Postural dysfunction, Pain  Visit Diagnosis: Chronic left shoulder pain  Chronic right shoulder pain  Cervicalgia  Muscle weakness (generalized)     Problem List Patient Active Problem List   Diagnosis Date Noted  . Chronic right-sided low back pain 09/03/2019  . Abnormal blood cell count 07/30/2019  . Gross hematuria 07/30/2019  . Prostatitis 07/30/2019  . Elevated blood-pressure reading without diagnosis of hypertension 07/30/2019  . Elevated PSA 04/02/2019  . Drop in hemoglobin 04/02/2019  . Vaccine  counseling 02/03/2019  . Abnormal EKG 11/28/2017  . Polyarthralgia 11/28/2017  . Encounter for health maintenance examination in adult 01/02/2016  . Special screening for malignant neoplasms, colon 01/02/2016  . Screening for prostate cancer 01/02/2016  . Benign prostatic hyperplasia 01/02/2016  . Nocturia 01/02/2016  . Lipoma of extremity 01/02/2016  . Epididymal cyst 07/07/2008  . Smoker 04/28/2008    CHilda Blades PT, DPT, LAT, ATC 11/03/19  11:28 AM Phone: [phone]: 3[facility] [address]Phone: [phone]  Fax:  [phone] Name: Jorge HANWAYMRN: [id number] of Birth: 1June 06, 1967

## 2019-11-10 ENCOUNTER — Ambulatory Visit: Payer: 59 | Admitting: Physical Therapy

## 2019-11-10 ENCOUNTER — Encounter: Payer: Self-pay | Admitting: Physical Therapy

## 2019-11-10 ENCOUNTER — Other Ambulatory Visit: Payer: Self-pay

## 2019-11-10 DIAGNOSIS — M25512 Pain in left shoulder: Secondary | ICD-10-CM | POA: Diagnosis not present

## 2019-11-10 DIAGNOSIS — M542 Cervicalgia: Secondary | ICD-10-CM

## 2019-11-10 DIAGNOSIS — M25511 Pain in right shoulder: Secondary | ICD-10-CM

## 2019-11-10 DIAGNOSIS — M6281 Muscle weakness (generalized): Secondary | ICD-10-CM

## 2019-11-10 DIAGNOSIS — G8929 Other chronic pain: Secondary | ICD-10-CM

## 2019-11-10 NOTE — Therapy (Signed)
[place], [place], [address] Phone: [phone]   Fax:  [phone]  Physical Therapy Treatment / Discharge  Patient Details  Name: Jorge Walker MRN: [id number] Date of Birth: 1965-09-18 Referring Provider (PT): Glade Lloyd Camelia Eng, PA-C   Encounter Date: 11/10/2019  PT End of Session - 11/10/19 1055    Visit Number  15    Number of Visits  16    Date for PT Re-Evaluation  11/17/19    Authorization Type  [employer]    PT Start Time  6962    PT Stop Time  1115    PT Time Calculation (min)  30 min    Activity Tolerance  Patient tolerated treatment well    Behavior During Therapy  [facility] for tasks assessed/performed       Past Medical History:  Diagnosis Date  . Allergy   . Arthritis    finger  . BPH (benign prostatic hypertrophy)   . Epididymal cyst   . Tobacco use disorder     Past Surgical History:  Procedure Laterality Date  . ARM WOUND REPAIR / CLOSURE     right upper posterior arm - teenager, s/p trauma  . ARTERY REPAIR     right upper arm - teenager, s/p trauma to right upper arm  . COLONOSCOPY  01/24/2016   Diverticulosis in left and right colon.   Repeat 10 years.  Dr. Scarlette Shorts  . ELBOW SURGERY  2019    There were no vitals filed for this visit.  Subjective Assessment - 11/10/19 1048    Subjective  Patient reports he is still about the same. He is ready to transition to independent HEP and see how he progresses. He notes that he does not feel limited during the day because of his shoulder or neck, but he has trouble sleeping at night because of bilat shoulder pain.    Patient Stated Goals  Reduce pain to improve ability to work and sleep    Currently in Pain?  Yes    Pain Score  5     Pain Location  Shoulder    Pain Orientation  Right;Left    Pain Descriptors / Indicators  Aching;Sharp    Pain Type  Chronic pain    Pain Onset  More than a month ago    Pain Frequency   Intermittent         [facility] PT Assessment - 11/10/19 0001      Assessment   Medical Diagnosis  Neck and bilateral shoulder pain    Referring Provider (PT)  Tysinger, Camelia Eng, PA-C      Balance Screen   Has the patient fallen in the past 6 months  No      Prior Function   Level of Independence  Independent    Vocation Requirements  Detailing cars      Cognition   Overall Cognitive Status  Within Functional Limits for tasks assessed      Observation/Other Assessments   Focus on Therapeutic Outcomes (FOTO)   47% limitation      Sensation   Light Touch  Appears Intact      Posture/Postural Control   Posture Comments  Patient continued to exhibited rounded shoulder and forward head posture, inferior angle and medial border winging at rest      AROM   Right Shoulder Flexion  170 Degrees    Right Shoulder ABduction  170 Degrees    Right Shoulder  Internal Rotation  --   T8   Right Shoulder External Rotation  --   T4   Left Shoulder Flexion  170 Degrees    Left Shoulder ABduction  170 Degrees    Left Shoulder Internal Rotation  --   T6   Left Shoulder External Rotation  --   T4     Strength   Right Shoulder Flexion  5/5    Right Shoulder ABduction  5/5    Right Shoulder Internal Rotation  5/5    Right Shoulder External Rotation  4/5   increased right shoulder pain   Left Shoulder Flexion  5/5    Left Shoulder ABduction  5/5    Left Shoulder Internal Rotation  5/5    Left Shoulder External Rotation  5/5                   [facility] Adult PT Treatment/Exercise - 11/10/19 0001      Exercises   Exercises  Shoulder;Neck      Shoulder Exercises: Standing   Horizontal ABduction  20 reps    Theraband Level (Shoulder Horizontal ABduction)  Level 2 (Red)    External Rotation  20 reps    Theraband Level (Shoulder External Rotation)  Level 2 (Red)    Internal Rotation  20 reps    Theraband Level (Shoulder Internal Rotation)  Level 3 (Green)    Extension  20 reps     Theraband Level (Shoulder Extension)  Level 3 (Green)    Row  20 reps    Theraband Level (Shoulder Row)  Level 4 (Blue)    Shoulder Elevation Limitations  Scaption with 3# x20    Other Standing Exercises  Push-up plus in plantigrade using table x15      Shoulder Exercises: ROM/Strengthening   UBE (Upper Arm Bike)  L3 x 4 min (2 fwd/bwd)      Shoulder Exercises: Stretch   Corner Stretch Limitations  3x20 sec doorway pec stretch    Internal Rotation Stretch Limitations  3x20 sec sleeper stretch      Neck Exercises: Stretches   Upper Trapezius Stretch  2 reps;20 seconds    Levator Stretch  2 reps;20 seconds             PT Education - 11/10/19 1052    Education Details  HEP    Person(s) Educated  Patient    Methods  Explanation;Demonstration    Comprehension  Verbalized understanding;Returned demonstration       PT Short Term Goals - 11/10/19 1119      PT SHORT TERM GOAL #1   Title  Patient will be independent with HEP to maintain progress in PT    Time  4    Period  Weeks    Status  Achieved      PT SHORT TERM GOAL #2   Title  Patient will report < or = 2-3/10 bilateral shoulder pain while reaching behind his back    Baseline  Report continued right shoulder pain    Time  4    Period  Weeks    Status  Not Met    Target Date  10/20/19      PT SHORT TERM GOAL #3   Title  Patient will exhibit improved posture to reduce stress on shoulders/neck and improve lifting ability    Baseline  Continues to exhibit poor posture    Time  4    Period  Weeks    Status  Not  Met    Target Date  10/20/19        PT Long Term Goals - 11/10/19 1120      PT LONG TERM GOAL #1   Title  Patient will exhibit gross UE strength 4+/5 to improve lifting ability    Baseline  Right ER strength 4/5 MMT - limited by pain    Time  8    Period  Weeks    Status  Partially Met      PT LONG TERM GOAL #2   Title  Patient will report < or = 1-2/10 pain with lifting and reaching behind his  back    Time  8    Period  Weeks    Status  Not Met      PT LONG TERM GOAL #3   Title  Patient will report no limitation with lying on his side to improve sleeping.    Baseline  Continues to report sleep disturbances lying on right side    Time  8    Period  Weeks    Status  Not Met      PT LONG TERM GOAL #4   Title  Patient will report improved function of FOTO < or = 28% limitation    Time  8    Period  Weeks    Status  Not Met            Plan - 11/10/19 1059    Clinical Impression Statement  Patient continues to have bilat shoulder pain, right worse than left, especially at night that affects his sleep. His range of motion and strength are good but he has most discomfort with resisted right shoulder ER. His symptoms continue to be most consistent with rotator cuff tendinopathy but he doesn't seem to be improve with forma PT so he will be discharged to independent HEP. He was encouraged to continue HEP but take rest days as needed and to follow up with his orthopedic doctor if he continues to have pain and limitation with sleeping.    PT Treatment/Interventions  ADLs/Self Care Home Management;Cryotherapy;Electrical Stimulation;Moist Heat;Iontophoresis '4mg'$ /ml Dexamethasone;Ultrasound;Therapeutic exercise;Therapeutic activities;Neuromuscular re-education;Patient/family education;Dry needling;Passive range of motion;Manual techniques;Taping;Spinal Manipulations;Joint Manipulations    PT Next Visit Plan  NA    PT Home Exercise Plan  Banded row with blue, extension with green, ER/IR with red, serratus hug with red or push-up plus on counter; seated upper trap and levator stretching, seated chin tuck, doorway pec stretch, ball STM for upper trap/levator/posterior cuff, cross body stretch against wall or sleeper stretch    Consulted and Agree with Plan of Care  Patient       Patient will benefit from skilled therapeutic intervention in order to improve the following deficits and  impairments:  Decreased activity tolerance, Decreased strength, Impaired flexibility, Postural dysfunction, Pain  Visit Diagnosis: Chronic left shoulder pain  Chronic right shoulder pain  Cervicalgia  Muscle weakness (generalized)     Problem List Patient Active Problem List   Diagnosis Date Noted  . Chronic right-sided low back pain 09/03/2019  . Abnormal blood cell count 07/30/2019  . Gross hematuria 07/30/2019  . Prostatitis 07/30/2019  . Elevated blood-pressure reading without diagnosis of hypertension 07/30/2019  . Elevated PSA 04/02/2019  . Drop in hemoglobin 04/02/2019  . Vaccine counseling 02/03/2019  . Abnormal EKG 11/28/2017  . Polyarthralgia 11/28/2017  . Encounter for health maintenance examination in adult 01/02/2016  . Special screening for malignant neoplasms, colon 01/02/2016  . Screening for prostate  cancer 01/02/2016  . Benign prostatic hyperplasia 01/02/2016  . Nocturia 01/02/2016  . Lipoma of extremity 01/02/2016  . Epididymal cyst 07/07/2008  . Smoker 04/28/2008    [place], [place], [address] Phone: [phone]   Fax:  [phone]  Name: Jorge Walker MRN: [id number] Date of Birth: 24-Jun-1966  PHYSICAL THERAPY DISCHARGE SUMMARY  Visits from Start of Care: 15  Current functional level related to goals / functional outcomes: See above   Remaining deficits: See above   Education / Equipment: HEP Plan: Patient agrees to discharge.  Patient goals were not met. Patient is being discharged due to lack of progress.  ?????    Hilda Blades, PT, DPT, LAT, ATC 11/10/19  11:22 AM Phone: [phone] Fax: [phone]

## 2020-09-05 ENCOUNTER — Other Ambulatory Visit: Payer: Self-pay

## 2020-09-05 ENCOUNTER — Encounter: Payer: Self-pay | Admitting: Medical

## 2020-09-05 ENCOUNTER — Ambulatory Visit (INDEPENDENT_AMBULATORY_CARE_PROVIDER_SITE_OTHER): Payer: 59 | Admitting: Medical

## 2020-09-05 VITALS — BP 118/82 | HR 64 | Ht 69.0 in | Wt 173.4 lb

## 2020-09-05 DIAGNOSIS — Z7185 Encounter for immunization safety counseling: Secondary | ICD-10-CM | POA: Diagnosis not present

## 2020-09-05 DIAGNOSIS — F17200 Nicotine dependence, unspecified, uncomplicated: Secondary | ICD-10-CM | POA: Diagnosis not present

## 2020-09-05 DIAGNOSIS — G8929 Other chronic pain: Secondary | ICD-10-CM

## 2020-09-05 DIAGNOSIS — M79604 Pain in right leg: Secondary | ICD-10-CM

## 2020-09-05 DIAGNOSIS — R972 Elevated prostate specific antigen [PSA]: Secondary | ICD-10-CM

## 2020-09-05 DIAGNOSIS — N401 Enlarged prostate with lower urinary tract symptoms: Secondary | ICD-10-CM | POA: Insufficient documentation

## 2020-09-05 DIAGNOSIS — Z0000 Encounter for general adult medical examination without abnormal findings: Secondary | ICD-10-CM

## 2020-09-05 DIAGNOSIS — N400 Enlarged prostate without lower urinary tract symptoms: Secondary | ICD-10-CM

## 2020-09-05 DIAGNOSIS — M5416 Radiculopathy, lumbar region: Secondary | ICD-10-CM

## 2020-09-05 NOTE — Patient Instructions (Signed)
Please go to [facility] for your lumbar xray.   Their hours are 8am - 4:30 pm Monday - Friday.  Take your insurance card with you.  [facility] [phone]  [address], [address] [place], [place] [address]  [address] [place], [place] [address] 

## 2020-09-05 NOTE — Progress Notes (Signed)
Subjective:   HPI  Jorge Walker is a 55 y.o. male who presents for a complete physical.  Medical team: Leanne Sisler, Camelia Eng, PA-C Dentist Eye doctor GI: Dr. Scarlette Shorts Dr. Ellison Hughs, Urology  Concerns: None, but when prompted about prior elevating PSA, he does endorse some nocturia, some urine hesitancy, some urinary dribbling.     He continues to smoke but has cut down  For the last few months been having pain in the right upper thigh particularly the anterior portion.  No numbness or tingling.  Occasional back pain, no saddle anesthesia, no fever, no night sweats  Last year we referred to urology for elevated PSA.  He has seen them in follow-up  He had recent flu shot at local pharmacy.  He has had COVID-vaccine  Reviewed their medical, surgical, family, social, medication, and allergy history and updated chart as appropriate.  Past Medical History:  Diagnosis Date  . Allergy   . Arthritis    finger  . BPH (benign prostatic hypertrophy) 2020   BPH with elevated PSA  . Epididymal cyst   . Tobacco use disorder     Past Surgical History:  Procedure Laterality Date  . ARM WOUND REPAIR / CLOSURE     right upper posterior arm - teenager, s/p trauma  . ARTERY REPAIR     right upper arm - teenager, s/p trauma to right upper arm  . COLONOSCOPY  01/24/2016   Diverticulosis in left and right colon.   Repeat 10 years.  Dr. Scarlette Shorts  . ELBOW SURGERY  2019    Social History   Socioeconomic History  . Marital status: Single    Spouse name: Not on file  . Number of children: Not on file  . Years of education: Not on file  . Highest education level: Not on file  Occupational History  . Not on file  Tobacco Use  . Smoking status: Current Every Day Smoker    Packs/day: 0.50    Years: 17.00    Pack years: 8.50    Types: Cigarettes  . Smokeless tobacco: Never Used  Vaping Use  . Vaping Use: Never used  Substance and Sexual Activity  . Alcohol use: Yes     Alcohol/week: 5.0 standard drinks    Types: 5 Shots of liquor per week    Comment: weekends   . Drug use: No  . Sexual activity: Not on file  Other Topics Concern  . Not on file  Social History Narrative   Single, girlfriend, divorced, has 1 child, 63yo daughter wants to go to college, she lives with mother, exercise with walking at work, works in Scientist, 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   Social Determinants of Health   Financial Resource Strain: Not on file  Food Insecurity: Not on file  Transportation Needs: Not on file  Physical Activity: Not on file  Stress: Not on file  Social Connections: Not on file  Intimate Partner Violence: Not on file    Family History  Problem Relation Age of Onset  . Hypertension Mother   . Diabetes Mother   . Pneumonia Father        died of pneumonia  . Cancer Neg Hx   . Heart disease Neg Hx   . Stroke Neg Hx   . Colon cancer Neg Hx   . Colon polyps Neg Hx   . Esophageal cancer Neg Hx   . Rectal cancer Neg Hx   .  Stomach cancer Neg Hx     No current outpatient medications on file.  No Known Allergies   Review of Systems Constitutional: -fever, -chills, -sweats, -unexpected weight change, -decreased appetite, -fatigue Allergy: -sneezing, -itching, -congestion Dermatology: -changing moles, --rash, +lumps ENT: -runny nose, -ear pain, -sore throat, -hoarseness, -sinus pain, -teeth pain, - ringing in ears, -hearing loss, -nosebleeds Cardiology: -chest pain, -palpitations, -swelling, -difficulty breathing when lying flat, -waking up short of breath Respiratory: -cough, -shortness of breath, -difficulty breathing with exercise or exertion, -wheezing, -coughing up blood Gastroenterology: -abdominal pain, -nausea, -vomiting, -diarrhea, -constipation, -blood in stool, -changes in bowel movement, -difficulty swallowing or eating Hematology: -bleeding, -bruising  Musculoskeletal: -joint aches, -muscle aches, -joint  swelling, -back pain, -neck pain, -cramping, -changes in gait Ophthalmology: denies vision changes, eye redness, itching, discharge Urology: -burning with urination, -difficulty urinating, -blood in urine, -urinary frequency, -urgency, -incontinence Neurology: -headache, -weakness, -tingling, -numbness, -memory loss, -falls, -dizziness Psychology: -depressed mood, -agitation, -sleep problems     Objective:   Physical Exam  BP 118/82   Pulse 64   Ht 5\' 9"  (1.753 m)   Wt 173 lb 6.4 oz (78.7 kg)   SpO2 96%   BMI 25.61 kg/m    General appearance: alert, no distress, WD/WN, lean AA male Skin:right triceps region with prior trauma scar, no other worrisome lesions HEENT: normocephalic, conjunctiva/corneas normal, sclerae anicteric, PERRLA, EOMi, nares patent, no discharge or erythema, pharynx normal Oral cavity: MMM, tongue normal, teeth with upper dentures, otherwise in good repair Neck: supple, no lymphadenopathy, no thyromegaly, no masses, normal ROM, no bruits Chest: non tender, normal shape and expansion Heart: RRR, normal S1, S2, no murmurs Lungs: CTA bilaterally, no wheezes, rhonchi, or rales Abdomen: +bs, soft, non tender, non distended, no masses, no hepatomegaly, no splenomegaly, no bruits Back: non tender, normal ROM, no scoliosis Musculoskeletal: left buttock midline with 3-4cm deep round palpable mass, lipoma, nontender, left upper anterior thigh with 4cm not well defined but fatty tissue lump most likely lipoma, otherwise upper extremities non tender, no obvious deformity, normal ROM throughout, lower extremities non tender, no obvious deformity, normal ROM throughout Extremities: no edema, no cyanosis, no clubbing Pulses: 2+ symmetric, upper and lower extremities, normal cap refill Neurological: alert, oriented x 3, CN2-12 intact, strength normal upper extremities and lower extremities, sensation normal throughout, DTRs 2+ throughout, no cerebellar signs, gait  normal Psychiatric: normal affect, behavior normal, pleasant  GU/rectal - deferred to urology   Assessment and Plan :   Encounter Diagnoses  Name Primary?  . Encounter for health maintenance examination in adult Yes  . Benign prostatic hyperplasia without lower urinary tract symptoms   . Smoker   . Vaccine counseling   . Right leg pain   . Chronic radicular low back pain   . Elevated PSA    Physical exam - discussed and counseled on healthy lifestyle, diet, exercise, preventative care, vaccinations, sick and well care, proper use of emergency dept and after hours care, and addressed their concerns.    Today you had a preventative care visit or wellness visit.    Topics today may have included healthy lifestyle, diet, exercise, preventative care, vaccinations, sick and well care, proper use of emergency dept and after hours care, as well as other concerns.     Recommendations: Continue to return yearly for your annual wellness and preventative care visits.  This gives [place] a chance to discuss healthy lifestyle, exercise, vaccinations, review your chart record, and perform screenings where appropriate.  I recommend  you see your eye doctor yearly for routine vision care.  I recommend you see your dentist yearly for routine dental care including hygiene visits twice yearly.   Vaccination recommendations were reviewed Tetanus vaccine up-to-date  Pneumococcal vaccine up-to-date  He notes recent flu shot at local pharmacy  He notes having COVID-vaccine  Shingles vaccine:  I recommend you have a shingles vaccine to help prevent shingles or herpes zoster outbreak.   Please call your insurer to inquire about coverage for the Shingrix vaccine given in 2 doses.   Some insurers cover this vaccine after age 92, some cover this after age 72.  If your insurer covers this, then call to schedule appointment to have this vaccine here.   Screening for cancer: Colon cancer screening:  I  reviewed your colonoscopy on file that is up to date from 2017 as well as negative stool cards within past year  We discussed PSA, prostate exam, and prostate cancer screening risks/benefits.   Followed by urology  Skin cancer screening: Check your skin regularly for new changes, growing lesions, or other lesions of concern Come in for evaluation if you have skin lesions of concern.  Lung cancer screening: If you have a greater than 30 pack year history of tobacco use, then you qualify for lung cancer screening with a chest CT scan  We currently don't have screenings for other cancers besides breast, cervical, colon, and lung cancers.  If you have a strong family history of cancer or have other cancer screening concerns, please let me know.    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  Currently no specific complaint  Smoker-advised smoking cessation  He will return within the next few days for fasting labs  Follow-up pending labs, yearly for physical   Codylee was seen today for annual exam.  Diagnoses and all orders for this visit:  Encounter for health maintenance examination in adult -     Comprehensive metabolic panel; Future -     CBC; Future -     Lipid panel; Future -     Iron; Future  Benign prostatic hyperplasia without lower urinary tract symptoms  Smoker  Vaccine counseling  Right leg pain -     DG Lumbar Spine Complete; Future  Chronic radicular low back pain -     DG Lumbar Spine Complete;  Future  Elevated PSA  Follow-up pending labs and x-ray

## 2020-09-06 ENCOUNTER — Other Ambulatory Visit: Payer: 59

## 2020-09-06 DIAGNOSIS — Z0000 Encounter for general adult medical examination without abnormal findings: Secondary | ICD-10-CM

## 2020-09-07 ENCOUNTER — Other Ambulatory Visit: Payer: Self-pay | Admitting: Medical

## 2020-09-07 ENCOUNTER — Ambulatory Visit
Admission: RE | Admit: 2020-09-07 | Discharge: 2020-09-07 | Disposition: A | Discharge status: Home / Self Care | Payer: 59 | Source: Ambulatory Visit | Attending: Medical | Admitting: Medical

## 2020-09-07 ENCOUNTER — Other Ambulatory Visit: Payer: Self-pay

## 2020-09-07 DIAGNOSIS — G8929 Other chronic pain: Secondary | ICD-10-CM

## 2020-09-07 DIAGNOSIS — M5416 Radiculopathy, lumbar region: Secondary | ICD-10-CM

## 2020-09-07 DIAGNOSIS — M79604 Pain in right leg: Secondary | ICD-10-CM

## 2020-09-07 LAB — COMPREHENSIVE METABOLIC PANEL
ALT: 14 IU/L (ref 0–44)
AST: 19 IU/L (ref 0–40)
Albumin/Globulin Ratio: 1.9 (ref 1.2–2.2)
Albumin: 4.6 g/dL (ref 3.8–4.9)
Alkaline Phosphatase: 67 IU/L (ref 44–121)
BUN/Creatinine Ratio: 11 (ref 9–20)
BUN: 14 mg/dL (ref 6–24)
Bilirubin Total: 0.3 mg/dL (ref 0.0–1.2)
CO2: 24 mmol/L (ref 20–29)
Calcium: 9.9 mg/dL (ref 8.7–10.2)
Chloride: 103 mmol/L (ref 96–106)
Creatinine, Ser: 1.24 mg/dL (ref 0.76–1.27)
GFR calc Af Amer: 75 mL/min/{1.73_m2} (ref >59)
GFR calc non Af Amer: 65 mL/min/{1.73_m2} (ref >59)
Globulin, Total: 2.4 g/dL (ref 1.5–4.5)
Glucose: 99 mg/dL (ref 65–99)
Potassium: 4.4 mmol/L (ref 3.5–5.2)
Sodium: 141 mmol/L (ref 134–144)
Total Protein: 7 g/dL (ref 6.0–8.5)

## 2020-09-07 LAB — LIPID PANEL
Chol/HDL Ratio: 4.3 ratio (ref 0.0–5.0)
Cholesterol, Total: 158 mg/dL (ref 100–199)
HDL: 37 mg/dL — ABNORMAL LOW (ref >39)
LDL Chol Calc (NIH): 96 mg/dL (ref 0–99)
Triglycerides: 143 mg/dL (ref 0–149)
VLDL Cholesterol Cal: 25 mg/dL (ref 5–40)

## 2020-09-07 LAB — CBC
Hematocrit: 39 % (ref 37.5–51.0)
Hemoglobin: 12.8 g/dL — ABNORMAL LOW (ref 13.0–17.7)
MCH: 28 pg (ref 26.6–33.0)
MCHC: 32.8 g/dL (ref 31.5–35.7)
MCV: 85 fL (ref 79–97)
Platelets: 229 10*3/uL (ref 150–450)
RBC: 4.57 x10E6/uL (ref 4.14–5.80)
RDW: 13.3 % (ref 11.6–15.4)
WBC: 7.3 10*3/uL (ref 3.4–10.8)

## 2020-09-07 LAB — IRON: Iron: 22 ug/dL — ABNORMAL LOW (ref 38–169)

## 2020-09-07 IMAGING — CR DG LUMBAR SPINE COMPLETE 4+V
5 series · 5 of 5 positions shown · non-contrast
Comparison: Lumbar spine radiograph dated [DATE].

CLINICAL DATA: [age] male with back pain.

EXAM:
LUMBAR SPINE - COMPLETE 4+ VIEW

[t lumbar spine ap]
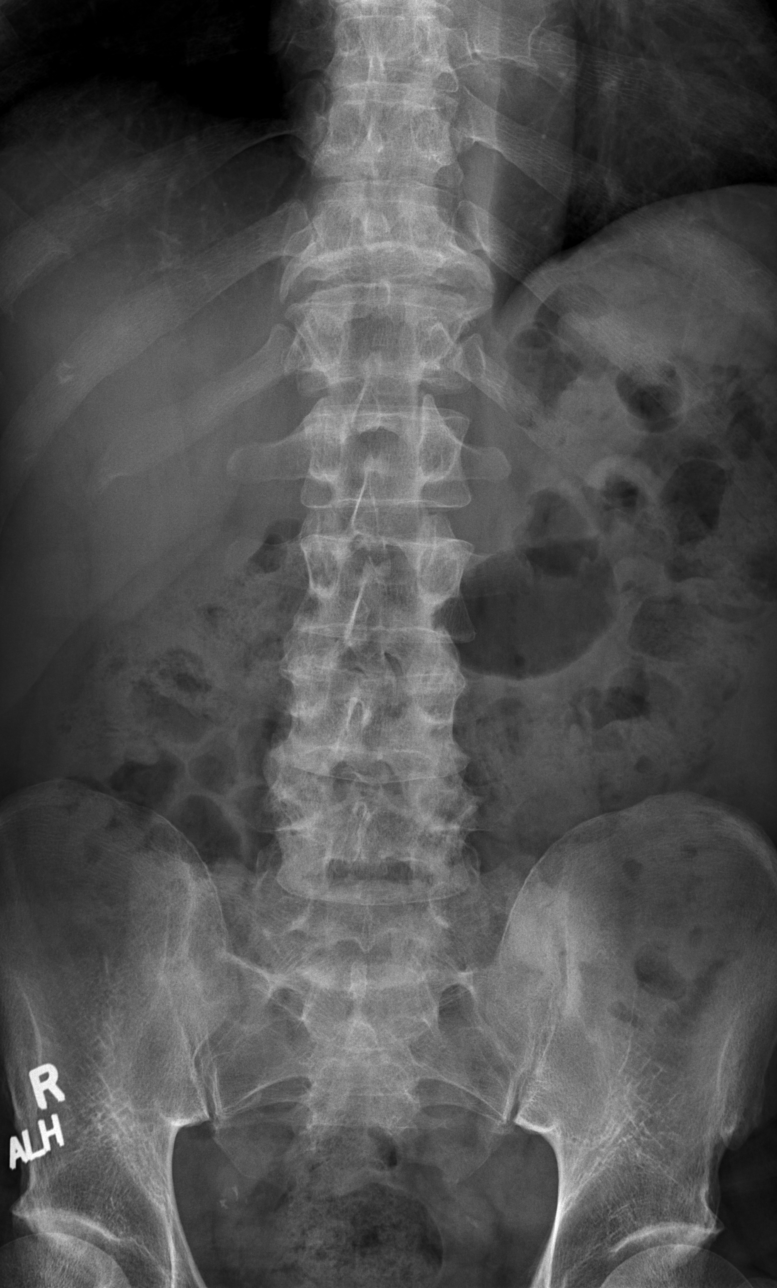

[t lumbar spine obl (1 of 2)]
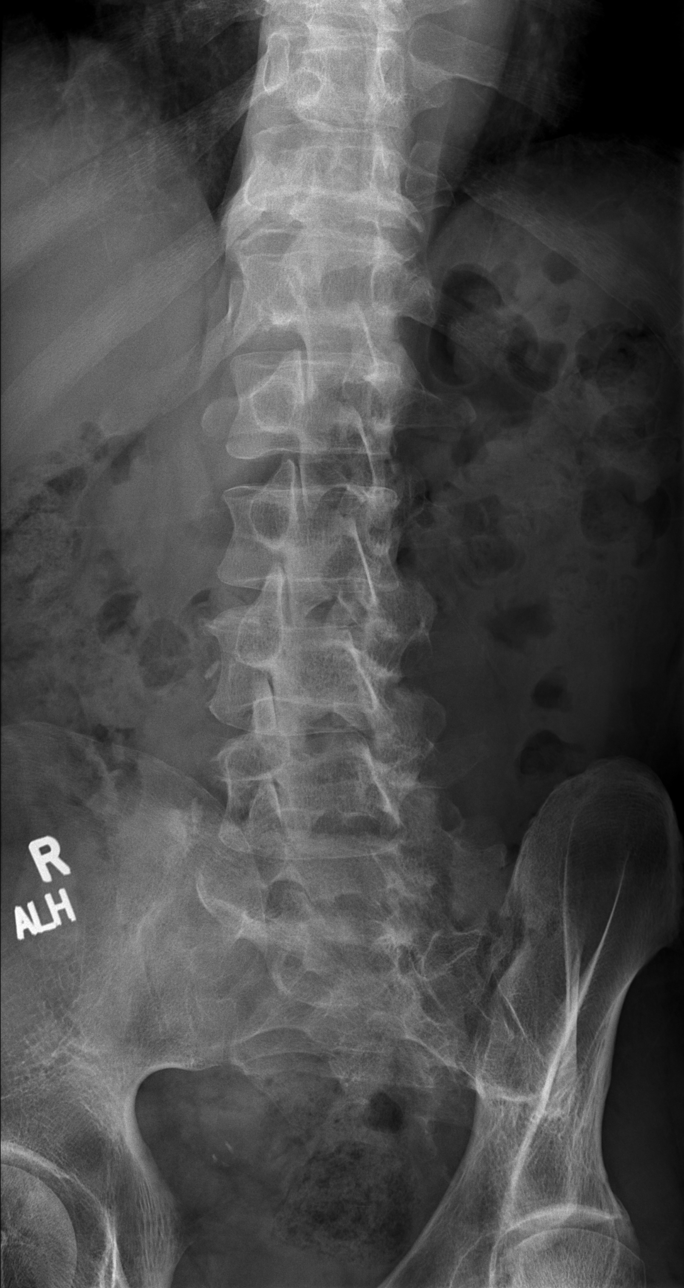

[t lumbar spine obl (2 of 2)]
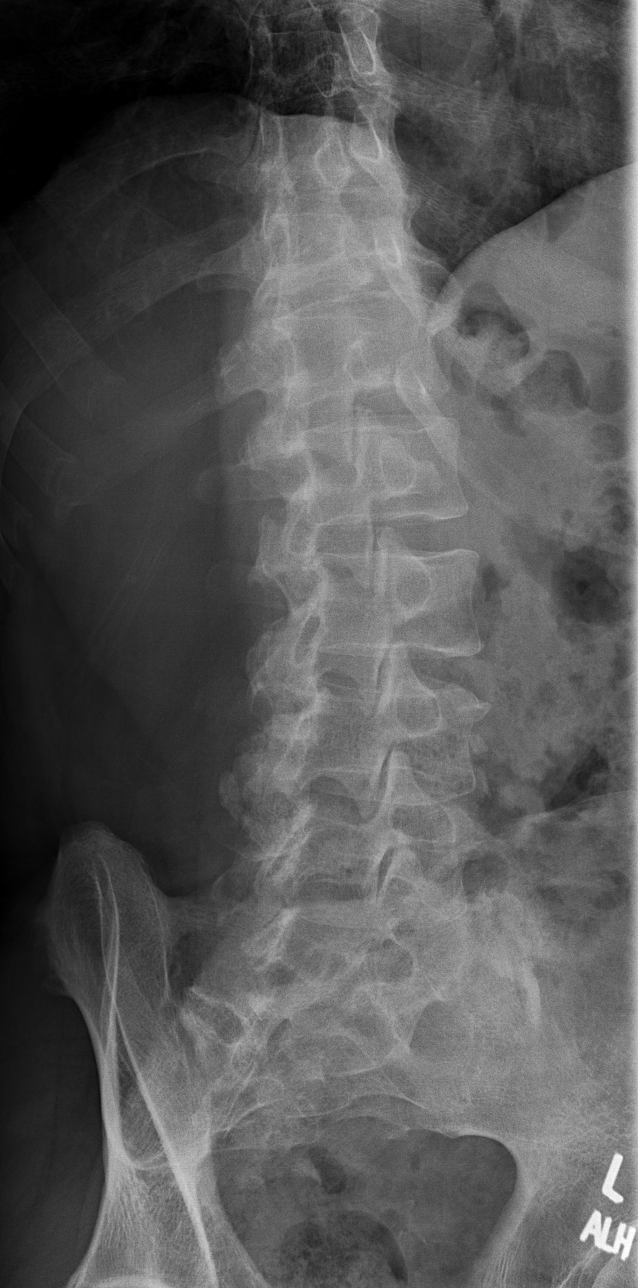

[t lumbar spine lat]
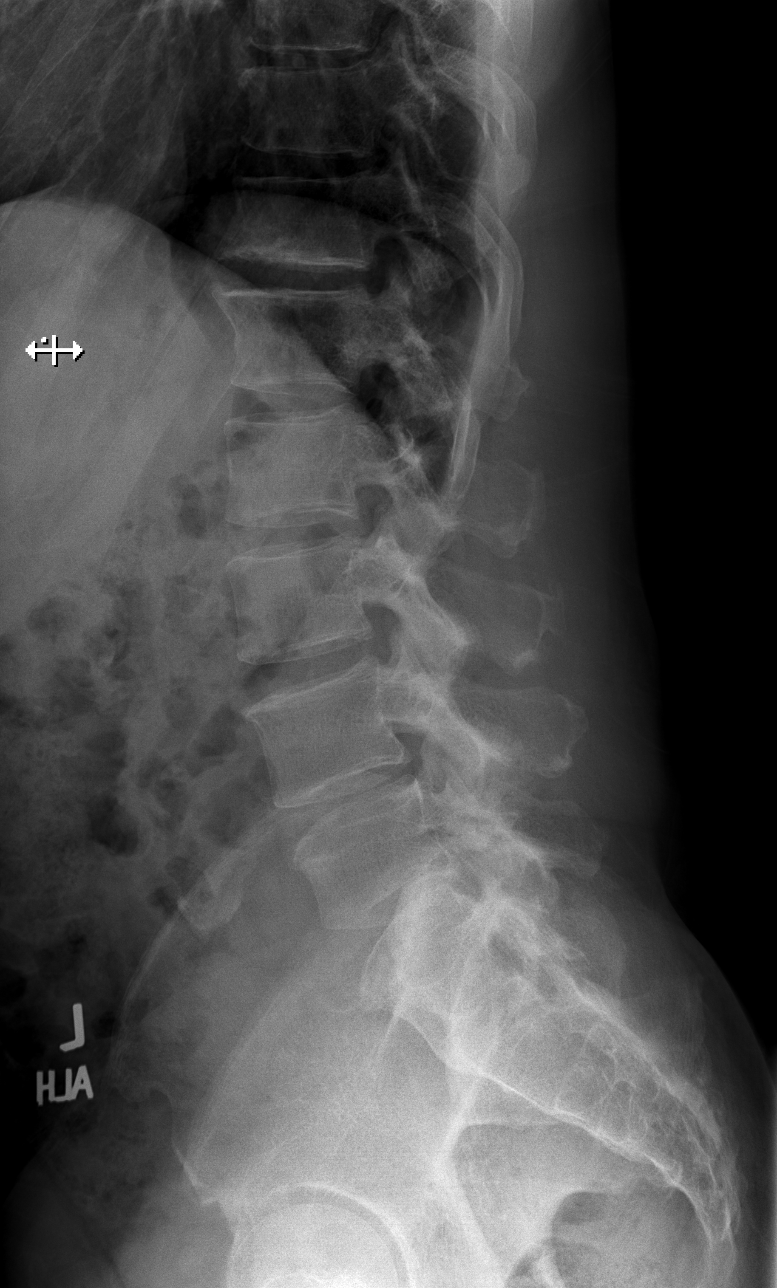

[t lumbar l-5 s-1 spot]
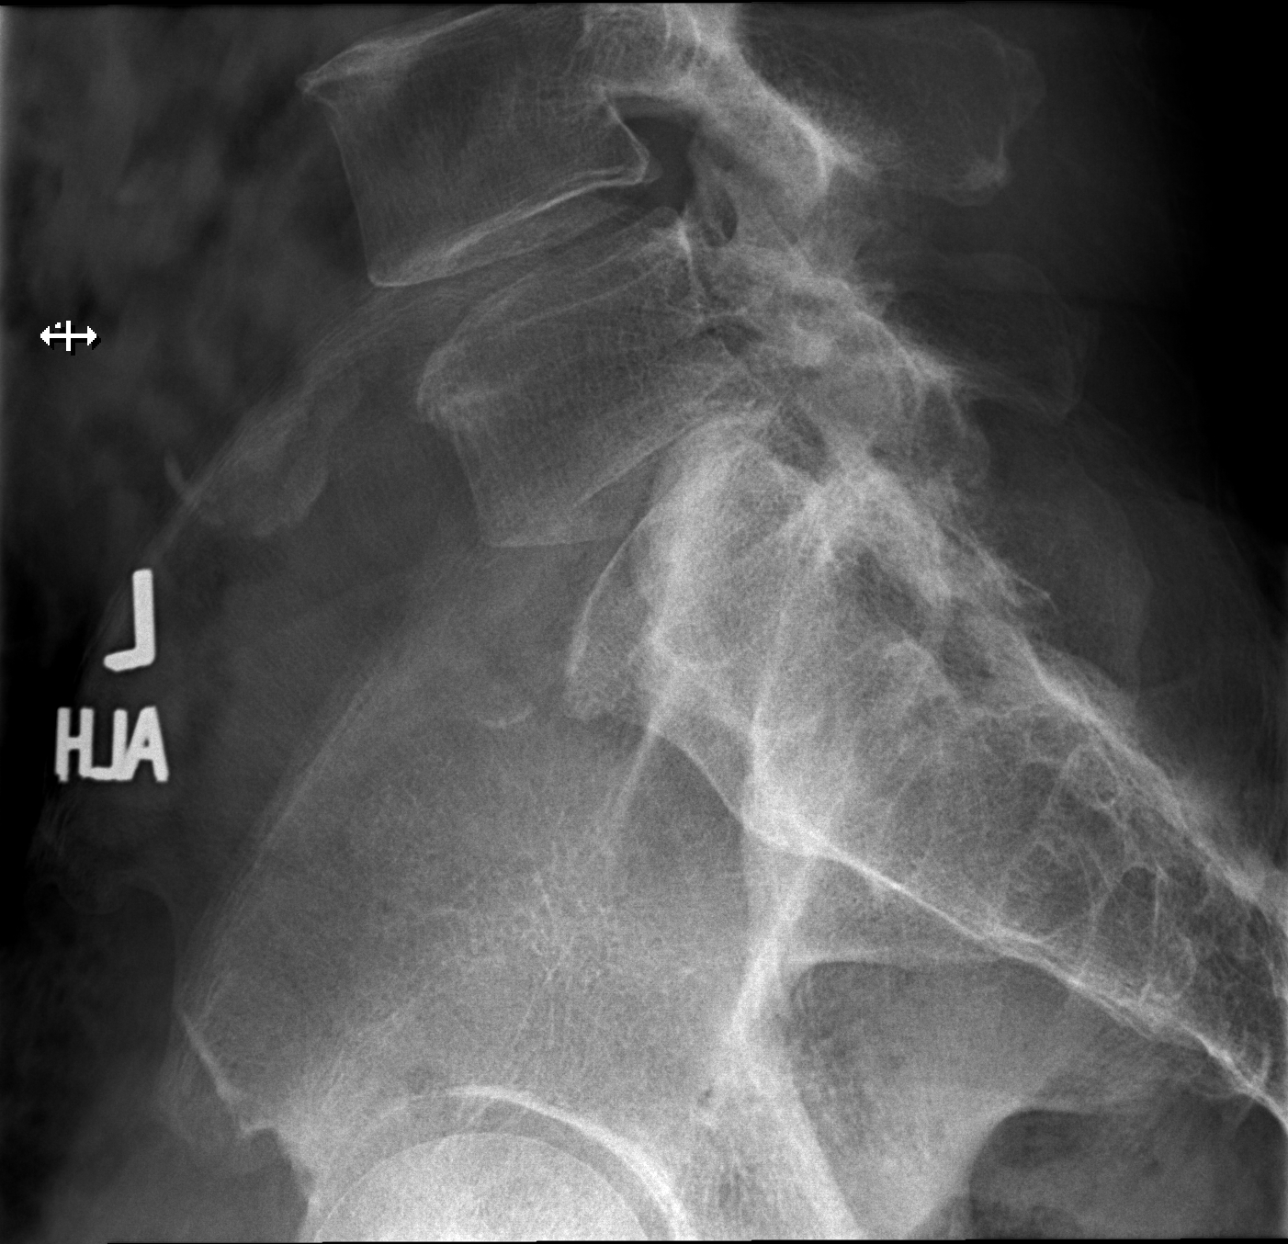

[5 of 5 positions shown; findings below may reference images not displayed]

FINDINGS: There is no acute fracture or subluxation of the lumbar spine. The
bones are osteopenic. There is grade 1 L5-S1 retrolisthesis. Lower
lumbar facet arthropathy. The soft tissues are unremarkable.
IMPRESSION: No acute fracture or subluxation.

## 2020-09-07 MED ORDER — FERROUS GLUCONATE 324 (38 FE) MG PO TABS: 324.0000 mg | ORAL_TABLET | Freq: Every day | ORAL | 1 refills | Status: DC

## 2020-09-29 ENCOUNTER — Encounter: Payer: Self-pay | Admitting: Medical

## 2020-09-29 ENCOUNTER — Other Ambulatory Visit: Payer: Self-pay

## 2020-09-29 ENCOUNTER — Ambulatory Visit (INDEPENDENT_AMBULATORY_CARE_PROVIDER_SITE_OTHER): Payer: 59 | Admitting: Medical

## 2020-09-29 VITALS — BP 140/82 | HR 61 | Ht 69.0 in | Wt 176.2 lb

## 2020-09-29 DIAGNOSIS — M5410 Radiculopathy, site unspecified: Secondary | ICD-10-CM | POA: Diagnosis not present

## 2020-09-29 DIAGNOSIS — Z1211 Encounter for screening for malignant neoplasm of colon: Secondary | ICD-10-CM | POA: Diagnosis not present

## 2020-09-29 DIAGNOSIS — M79606 Pain in leg, unspecified: Secondary | ICD-10-CM

## 2020-09-29 DIAGNOSIS — M8588 Other specified disorders of bone density and structure, other site: Secondary | ICD-10-CM

## 2020-09-29 DIAGNOSIS — Z130 Encounter for screening for diseases of the blood and blood-forming organs and certain disorders involving the immune mechanism: Secondary | ICD-10-CM

## 2020-09-29 MED ORDER — TRAMADOL HCL 50 MG PO TABS: 50.0000 mg | ORAL_TABLET | Freq: Three times a day (TID) | ORAL | 0 refills | Status: AC | PRN

## 2020-09-29 MED ORDER — CALCIUM CARBONATE 600 MG PO TABS
600.0000 mg | ORAL_TABLET | Freq: Two times a day (BID) | ORAL | 3 refills | Status: DC
Start: 2020-09-29 — End: 2020-11-07

## 2020-09-29 MED ORDER — VITAMIN D 25 MCG (1000 UNIT) PO TABS: 1000.0000 [IU] | ORAL_TABLET | Freq: Every day | ORAL | 3 refills | Status: DC

## 2020-09-29 MED ORDER — NAPROXEN 500 MG PO TABS: 500.0000 mg | ORAL_TABLET | Freq: Two times a day (BID) | ORAL | 0 refills | Status: DC

## 2020-09-29 NOTE — Patient Instructions (Signed)
Leg pain, low back pain  Stretch daily  You can use Naprosyn prescription 1 or 2 times a day as needed for inflammation and pain in the back of the leg  The other option would be Tylenol over-the-counter 500 mg or 650 mg tablet no more than twice daily  If you have days with the pain is much worse you can use tramadol prescription pain medicine, but use this sparingly as it is a controlled substance and can make you sleepy  I recommend a referral to orthopedics or physical therapy for further treatment recommendations and to try to ease some of the pain   Osteopenia/low bone mass  Begin calcium supplement twice daily prescription  Begin vitamin D 1000 units daily prescription  Try to get fish and dairy in your diet  Continue with weightbearing activity such as at work  Avoid heavy alcohol use as this can make the bones less dense    Please call to schedule your bone density test   [facility]  [phone], [place], [place] [address]      Osteopenia  Osteopenia is a loss of thickness (density) inside the bones. Another name for osteopenia is low bone mass. Mild osteopenia is a normal part of aging. It is not a disease, and it does not cause symptoms. However, if you have osteopenia and continue to lose bone mass, you could develop a condition that causes the bones to become thin and break more easily (osteoporosis). Osteoporosis can cause you to lose some height, have back pain, and have a stooped posture. Although osteopenia is not a disease, making changes to your lifestyle and diet can help to prevent osteopenia from developing into osteoporosis. What are the causes? Osteopenia is caused by loss of calcium in the bones. Bones are constantly changing. Old bone cells are continually being replaced with new bone cells. This process builds new bone. The mineral calcium is needed to build new bone and maintain bone  density. Bone density is usually highest around age 80. After that, most people's bodies cannot replace all the bone they have lost with new bone. What increases the risk? You are more likely to develop this condition if:  You are older than age 27.  You are a woman who went through menopause early.  You have a long illness that keeps you in bed.  You do not get enough exercise.  You lack certain nutrients (malnutrition).  You have an overactive thyroid gland (hyperthyroidism).  You use products that contain nicotine or tobacco, such as cigarettes, e-cigarettes and chewing tobacco, or you drink a lot of alcohol.  You are taking medicines that weaken the bones, such as steroids. What are the signs or symptoms? This condition does not cause any symptoms. You may have a slightly higher risk for bone breaks (fractures), so getting fractures more easily than normal may be an indication of osteopenia. How is this diagnosed? This condition may be diagnosed based on an X-ray exam that measures bone density (dual-energy X-ray absorptiometry, or DEXA). This test can measure bone density in your hips, spine, and wrists. Osteopenia has no symptoms, so this condition is usually diagnosed after a routine bone density screening test is done for osteoporosis. This routine screening is usually done for:  Women who are age 32 or older.  Men who are age 72 or older. If you have risk factors for osteopenia, you may have the screening test at an earlier  age. How is this treated? Making dietary and lifestyle changes can lower your risk for osteoporosis. If you have severe osteopenia that is close to becoming osteoporosis, this condition can be treated with medicines and dietary supplements such as calcium and vitamin D. These supplements help to rebuild bone density. Follow these instructions at home: Eating and drinking Eat a diet that is high in calcium and vitamin D.  Calcium is found in dairy  products, beans, salmon, and leafy green vegetables like spinach and broccoli.  Look for foods that have vitamin D and calcium added to them (fortified foods), such as orange juice, cereal, and bread.   Lifestyle  Do 30 minutes or more of a weight-bearing exercise every day, such as walking, jogging, or playing a sport. These types of exercises strengthen the bones.  Do not use any products that contain nicotine or tobacco, such as cigarettes, e-cigarettes, and chewing tobacco. If you need help quitting, ask your health care provider.  Do not drink alcohol if: ? Your health care provider tells you not to drink. ? You are pregnant, may be pregnant, or are planning to become pregnant.  If you drink alcohol: ? Limit how much you use to:  0-1 drink a day for women.  0-2 drinks a day for men. ? Be aware of how much alcohol is in your drink. In the [place] one drink equals one 12 oz bottle of beer (355 mL), one 5 oz glass of wine (148 mL), or one 1 oz glass of hard liquor (44 mL). General instructions  Take over-the-counter and prescription medicines only as told by your health care provider. These include vitamins and supplements.  Take precautions at home to lower your risk of falling, such as: ? Keeping rooms well-lit and free of clutter, such as cords. ? Installing safety rails on stairs. ? Using rubber mats in the bathroom or other areas that are often wet or slippery.  Keep all follow-up visits. This is important. Contact a health care provider if:  You have not had a bone density screening for osteoporosis and you are: ? A woman who is age 94 or older. ? A man who is age 42 or older.  You are a postmenopausal woman who has not had a bone density screening for osteoporosis.  You are older than age 14 and you want to know if you should have bone density screening for osteoporosis. Summary  Osteopenia is a loss of thickness (density) inside the bones. Another name for osteopenia  is low bone mass.  Osteopenia is not a disease, but it may increase your risk for a condition that causes the bones to become thin and break more easily (osteoporosis).  You may be at risk for osteopenia if you are older than age 61 or if you are a woman who went through early menopause.  Osteopenia does not cause any symptoms, but it can be diagnosed with a bone density screening test.  Dietary and lifestyle changes are the first treatment for osteopenia. These may lower your risk for osteoporosis. This information is not intended to replace advice given to you by your health care provider. Make sure you discuss any questions you have with your health care provider. Document Revised: 01/27/2020 Document Reviewed: 01/27/2020 Elsevier Patient Education  [place].

## 2020-09-29 NOTE — Progress Notes (Signed)
Subjective:  Jorge Walker is a 55 y.o. male who presents for Chief Complaint  Patient presents with  . Follow-up    On bone density     Here for follow-up on his recent x-ray for his back. There was mention of osteopenia. He denies family history of osteoporosis or fracture in an adult and he himself has not had a fracture as an adult  He still has leg pain and low back pain daily at work has to shift off one side to ease the pain. Pain radiates down the right leg.  Tylenol is not working for his pain he wants something stronger.  At his recent physical he was found to have low iron and anemia. He has started the iron therapy. He turned in some stool cards that we gave him  No other aggravating or relieving factors.    No other c/o.  Past Medical History:  Diagnosis Date  . Allergy   . Arthritis    finger  . BPH (benign prostatic hypertrophy) 2020   BPH with elevated PSA  . Epididymal cyst   . Tobacco use disorder    Current Outpatient Medications on File Prior to Visit  Medication Sig Dispense Refill  . ferrous gluconate (FERGON) 324 MG tablet Take 1 tablet (324 mg total) by mouth daily with breakfast. 90 tablet 1   No current facility-administered medications on file prior to visit.     The following portions of the patient's history were reviewed and updated as appropriate: allergies, current medications, past family history, past medical history, past social history, past surgical history and problem list.  ROS Otherwise as in subjective above  Objective: BP 140/82   Pulse 61   Ht 5\' 9"  (1.753 m)   Wt 176 lb 3.2 oz (79.9 kg)   SpO2 96%   BMI 26.02 kg/m   General appearance: alert, no distress, well developed, well nourished, lean African American male Back: non tender, normal ROM, no scoliosis Musculoskeletal: left buttock midline with 3-4cm deep round palpable mass, lipoma, nontender, left upper anterior thigh with 4cm not well defined but fatty tissue  lump most likely lipoma, otherwise upper extremities non tender, no obvious deformity, normal ROM throughout, lower extremities non tender, no obvious deformity, normal ROM throughout Extremities: no edema, no cyanosis, no clubbing Pulses: 2+ symmetric, upper and lower extremities, normal cap refill Neurological: alert, oriented x 3, CN2-12 intact, strength normal upper extremities and lower extremities, sensation normal throughout, DTRs 2+ throughout, no cerebellar signs, gait normal   Assessment: Encounter Diagnoses  Name Primary?  . Osteopenia of lumbar spine Yes  . Pain of lower extremity, unspecified laterality   . Radicular pain of right lower extremity   . Screening for colon cancer   . Screening for iron deficiency anemia      Plan: Osteopenia-discussed the findings on his recent x-ray. We will plan for bone density test. He will begin calcium and vitamin D supplement he will try to get sun exposure, I encouraged fish and dairy in the diet which he is not doing much of. Continue weightbearing exercise. We discussed the potential for using other medicines if he comes back positive for osteoporosis  Right radicular leg pain and low back pain-referral to orthopedics. In the meantime can use either Tylenol at higher strengths over-the-counter or the Naprosyn prescribed. Ultram for breakthrough pain but use this sparingly. Discussed the risk and benefits of medication.  Anemia and low iron, continue iron therapy recently started. I did  not see the result from the Hemoccult card he did so we will do this again. Consider GI follow-up   Jorge Walker was seen today for follow-up.  Diagnoses and all orders for this visit:  Osteopenia of lumbar spine -     DG Bone Density; Future  Pain of lower extremity, unspecified laterality -     DG Bone Density; Future -     AMB referral to orthopedics  Radicular pain of right lower extremity -     AMB referral to orthopedics  Screening for colon  cancer -     Fecal occult blood, imunochemical([facility])  Screening for iron deficiency anemia -     Fecal occult blood, imunochemical([facility])  Other orders -     cholecalciferol (VITAMIN D3) 25 MCG (1000 UNIT) tablet; Take 1 tablet (1,000 Units total) by mouth daily. -     calcium carbonate (CALCIUM 600) 600 MG TABS tablet; Take 1 tablet (600 mg total) by mouth 2 (two) times daily with a meal. -     naproxen (NAPROSYN) 500 MG tablet; Take 1 tablet (500 mg total) by mouth 2 (two) times daily with a meal. -     traMADol (ULTRAM) 50 MG tablet; Take 1 tablet (50 mg total) by mouth every 8 (eight) hours as needed for up to 5 days.    Follow up: pending bone density study and ortho referral

## 2020-10-02 ENCOUNTER — Other Ambulatory Visit: Payer: Self-pay | Admitting: Medical

## 2020-10-02 DIAGNOSIS — M79606 Pain in leg, unspecified: Secondary | ICD-10-CM

## 2020-10-02 DIAGNOSIS — M8588 Other specified disorders of bone density and structure, other site: Secondary | ICD-10-CM

## 2020-10-04 LAB — FECAL OCCULT BLOOD, IMMUNOCHEMICAL: Fecal Occult Bld: NEGATIVE

## 2020-10-06 ENCOUNTER — Other Ambulatory Visit: Payer: Self-pay | Admitting: Medical

## 2020-10-06 MED ORDER — TRAMADOL HCL 50 MG PO TABS: 50.0000 mg | ORAL_TABLET | Freq: Two times a day (BID) | ORAL | 0 refills | Status: DC | PRN

## 2020-10-09 ENCOUNTER — Telehealth: Payer: Self-pay | Admitting: Medical

## 2020-10-09 NOTE — Telephone Encounter (Signed)
Pt called and states that he is still in a lot of pain, states that the naproxen and the tramadol is not working,  He is wanting to know if there is something else he can take for pain, states he only takes the tramadol at night cause it makes him sleepy  Pt uses [facility], [address] And pt can be reached at [phone]

## 2020-10-10 NOTE — Telephone Encounter (Signed)
Left detail message on machine for patient. 

## 2020-10-10 NOTE — Telephone Encounter (Signed)
I would use the naproxen in the day, tramadol at night, and lets see what ortho can do to help.    He could use ice water therapy on the affected area 20 minutes at a time in the evening, and can use topical cream such as Capsaicin cream over the counter

## 2020-10-13 ENCOUNTER — Encounter: Payer: Self-pay | Admitting: Orthopaedic Surgery

## 2020-10-13 ENCOUNTER — Ambulatory Visit (INDEPENDENT_AMBULATORY_CARE_PROVIDER_SITE_OTHER): Payer: 59 | Admitting: Orthopaedic Surgery

## 2020-10-13 DIAGNOSIS — M5416 Radiculopathy, lumbar region: Secondary | ICD-10-CM | POA: Diagnosis not present

## 2020-10-13 MED ORDER — METHOCARBAMOL 500 MG PO TABS: 500.0000 mg | ORAL_TABLET | Freq: Two times a day (BID) | ORAL | 0 refills | Status: DC | PRN

## 2020-10-13 MED ORDER — PREDNISONE 10 MG (21) PO TBPK: ORAL_TABLET | ORAL | 0 refills | Status: DC

## 2020-10-13 NOTE — Progress Notes (Signed)
Office Visit Note   Patient: Jorge Walker           Date of Birth: 05/14/1966           MRN: [id number] Visit Date: 10/13/2020              Requested by: Carlena Hurl, PA-C [address] [place],  [place] [address] PCP: Carlena Hurl, PA-C   Assessment & Plan: Visit Diagnoses:  1. Radiculopathy, lumbar region     Plan: Impression is right lower back pain and right lower extremity radiculopathy.  At this point, I will have him stop his anti-inflammatories.  I will start him on a course of steroids and muscle relaxers.  We will also go ahead and send in a referral for physical therapy.  He will follow up with [place] as needed.  Follow-Up Instructions: Return if symptoms worsen or fail to improve.   Orders:  Orders Placed This Encounter  Procedures  . Ambulatory referral to Physical Therapy   Meds ordered this encounter  Medications  . predniSONE (STERAPRED UNI-PAK 21 TAB) 10 MG (21) TBPK tablet    Sig: Take as directed    Dispense:  21 tablet    Refill:  0  . methocarbamol (ROBAXIN) 500 MG tablet    Sig: Take 1 tablet (500 mg total) by mouth 2 (two) times daily as needed.    Dispense:  20 tablet    Refill:  0      Procedures: No procedures performed   Clinical Data: No additional findings.   Subjective: Chief Complaint  Patient presents with  . Lower Back - Pain    HPI patient is a pleasant 55 year old gentleman who comes in today with right lower back pain for the past 5 to 6 months.  No known injury or change in activity.  The pain he has is to the right lower back and radiates to the top of the thigh.  He denies any pain to the groin.  No pain past the thigh.  Pain he has occurs when he is standing all day as well as at night.  He has been taking naproxen without significant relief of symptoms.  He denies any numbness, tingling or burning.  No weakness.  No bowel or bladder change or saddle paresthesias.  Review of Systems as detailed in HPI.  All  others reviewed and are negative.   Objective: Vital Signs: There were no vitals taken for this visit.  Physical Exam well-developed and well-nourished gentleman in no acute distress.  Alert and oriented x3.  Ortho Exam lumbar exam shows no spinous or paraspinous tenderness.  No tenderness the greater trochanter.  Negative logroll negative FADIR.  Negative straight leg raise.  No focal weakness.  He is neurovascular intact distally.  Specialty Comments:  No specialty comments available.  Imaging: X-rays reviewed by me in [facility] show mild multilevel spondylosis.  Otherwise, no acute findings.   PMFS History: Patient Active Problem List   Diagnosis Date Noted  . Right leg pain 09/05/2020  . Chronic radicular low back pain 09/05/2020  . Benign prostatic hyperplasia with urinary frequency 09/05/2020  . Chronic right-sided low back pain 09/03/2019  . Abnormal blood cell count 07/30/2019  . Gross hematuria 07/30/2019  . Prostatitis 07/30/2019  . Elevated blood-pressure reading without diagnosis of hypertension 07/30/2019  . Elevated PSA 04/02/2019  . Drop in hemoglobin 04/02/2019  . Vaccine counseling 02/03/2019  . Abnormal EKG 11/28/2017  . Polyarthralgia 11/28/2017  .  Encounter for health maintenance examination in adult 01/02/2016  . Special screening for malignant neoplasms, colon 01/02/2016  . Screening for prostate cancer 01/02/2016  . Benign prostatic hyperplasia 01/02/2016  . Nocturia 01/02/2016  . Lipoma of extremity 01/02/2016  . Epididymal cyst 07/07/2008  . Smoker 04/28/2008   Past Medical History:  Diagnosis Date  . Allergy   . Arthritis    finger  . BPH (benign prostatic hypertrophy) 2020   BPH with elevated PSA  . Epididymal cyst   . Tobacco use disorder     Family History  Problem Relation Age of Onset  . Hypertension Mother   . Diabetes Mother   . Pneumonia Father        died of pneumonia  . Cancer Neg Hx   . Heart disease Neg Hx   . Stroke Neg  Hx   . Colon cancer Neg Hx   . Colon polyps Neg Hx   . Esophageal cancer Neg Hx   . Rectal cancer Neg Hx   . Stomach cancer Neg Hx     Past Surgical History:  Procedure Laterality Date  . ARM WOUND REPAIR / CLOSURE     right upper posterior arm - teenager, s/p trauma  . ARTERY REPAIR     right upper arm - teenager, s/p trauma to right upper arm  . COLONOSCOPY  01/24/2016   Diverticulosis in left and right colon.   Repeat 10 years.  Dr. Scarlette Shorts  . ELBOW SURGERY  2019   Social History   Occupational History  . Not on file  Tobacco Use  . Smoking status: Current Every Day Smoker    Packs/day: 0.50    Years: 17.00    Pack years: 8.50    Types: Cigarettes  . Smokeless tobacco: Never Used  Vaping Use  . Vaping Use: Never used  Substance and Sexual Activity  . Alcohol use: Yes    Alcohol/week: 5.0 standard drinks    Types: 5 Shots of liquor per week    Comment: weekends   . Drug use: No  . Sexual activity: Not on file

## 2020-10-17 ENCOUNTER — Telehealth: Payer: Self-pay | Admitting: Orthopaedic Surgery

## 2020-10-17 DIAGNOSIS — M5416 Radiculopathy, lumbar region: Secondary | ICD-10-CM

## 2020-10-17 NOTE — Telephone Encounter (Signed)
Pt called stating he had an appt on 10/13/20 and was prescribed prednisone and robaxin and he states neither is really making a difference. He would like something else called in and then would like a CB to update him on what Dr. Erlinda Hong will send in.   [phone]

## 2020-10-18 ENCOUNTER — Other Ambulatory Visit: Payer: Self-pay | Admitting: Physician Assistant

## 2020-10-18 MED ORDER — TRAMADOL HCL 50 MG PO TABS: 50.0000 mg | ORAL_TABLET | Freq: Three times a day (TID) | ORAL | 0 refills | Status: DC | PRN

## 2020-10-18 NOTE — Telephone Encounter (Signed)
Lvm for pt to call back to inform 

## 2020-10-18 NOTE — Telephone Encounter (Signed)
I sent in tramadol.  If he needs something stronger, will need to get from pcp.  Can we go ahead and get MRI lumbar spine?

## 2020-10-18 NOTE — Telephone Encounter (Signed)
MRI ordered. Will call pt soon to discuss

## 2020-10-27 ENCOUNTER — Other Ambulatory Visit: Payer: Self-pay

## 2020-10-27 ENCOUNTER — Ambulatory Visit: Payer: 59 | Attending: Physician Assistant

## 2020-10-27 DIAGNOSIS — M25512 Pain in left shoulder: Secondary | ICD-10-CM | POA: Insufficient documentation

## 2020-10-27 DIAGNOSIS — G8929 Other chronic pain: Secondary | ICD-10-CM | POA: Insufficient documentation

## 2020-10-27 DIAGNOSIS — M5416 Radiculopathy, lumbar region: Secondary | ICD-10-CM | POA: Diagnosis present

## 2020-10-27 DIAGNOSIS — R293 Abnormal posture: Secondary | ICD-10-CM

## 2020-10-27 DIAGNOSIS — M62830 Muscle spasm of back: Secondary | ICD-10-CM

## 2020-10-27 NOTE — Therapy (Signed)
[place], [place], [address] Phone: [phone]   Fax:  [phone]  Physical Therapy Evaluation  Patient Details  Name: Jorge Walker MRN: [id number] Date of Birth: Dec 25, 1965 Referring Provider (PT): Dwana Melena, [facility]   Encounter Date: 10/27/2020   PT End of Session - 10/27/20 1146    Visit Number 1    Number of Visits 12    Date for PT Re-Evaluation 12/08/20    Authorization Type [employer]    PT Start Time 1045    PT Stop Time 1135    PT Time Calculation (min) 50 min    Activity Tolerance No increased pain    Behavior During Therapy [facility] for tasks assessed/performed           Past Medical History:  Diagnosis Date  . Allergy   . Arthritis    finger  . BPH (benign prostatic hypertrophy) 2020   BPH with elevated PSA  . Epididymal cyst   . Tobacco use disorder     Past Surgical History:  Procedure Laterality Date  . ARM WOUND REPAIR / CLOSURE     right upper posterior arm - teenager, s/p trauma  . ARTERY REPAIR     right upper arm - teenager, s/p trauma to right upper arm  . COLONOSCOPY  01/24/2016   Diverticulosis in left and right colon.   Repeat 10 years.  Dr. Scarlette Shorts  . ELBOW SURGERY  2019    There were no vitals filed for this visit.    Subjective Assessment - 10/27/20 1104    Subjective RT lower back pain for 3 months or more radiating to anterior RT thigh.   Worse standing for work eases some sitting . Sleep disturbed.    Limitations Standing   sleep postions.   How long can you sit comfortably? as needed    How long can you stand comfortably? some days 4 hours some longer    Diagnostic tests degenerativ e changes and one segment retrolisthisis    Patient Stated Goals Decrease pain    Currently in Pain? Yes    Pain Score 5     Pain Location Back    Pain Orientation Right    Pain Descriptors / Indicators Aching    Pain Type Chronic pain    Pain Radiating Towards RT  anterior thigh    Pain Onset More than a month ago    Pain Frequency Intermittent    Aggravating Factors  standing    Pain Relieving Factors sitting              [facility] PT Assessment - 10/27/20 0001      Assessment   Medical Diagnosis RT LBP radiculopathy    Referring Provider (PT) Dwana Melena, 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      Cognition   Overall Cognitive Status Within Functional Limits for tasks assessed      Posture/Postural Control  Posture Comments slumped sitting, standing RT shoulder lower, RT pelvis slightly higher      ROM / Strength   AROM / PROM / Strength AROM;PROM;Strength      AROM   AROM Assessment Site Lumbar    Lumbar Flexion 60    Lumbar Extension 20    Lumbar - Right Side Bend 10    Lumbar - Left Side Bend 20      Flexibility   Soft Tissue Assessment /Muscle Length yes    Hamstrings Seated knee extension LT pulls RT ba k  RT does not    ITB Mild + RT      Palpation   SI assessment  RT clavicula jump +, possible LT inflare ( RT out flare)    Lt ASIS  more proximal ,  Supine LT le longer.  RT PSIS slightly more distal . Sacrum rot LT    Palpation comment RTR hip slight decr IR compared to LT                      Objective measurements completed on examination: See above findings.               PT Education - 10/27/20 1110    Education Details FOTO score results and possible progress    Person(s) Educated Patient    Methods Explanation    Comprehension Verbalized understanding            PT Short Term Goals - 10/27/20 1059       PT SHORT TERM GOAL #1   Title Patient will be independent with HEP to maintain progress in PT    Time 3    Period Weeks    Status New      PT SHORT TERM GOAL #2   Title Patient will report < or = 2-3/10 LBP    Time 3    Period Weeks    Status New      PT SHORT TERM GOAL #3   Title Patient will exhibit improved posture to reduce stress on shoulders/neck and improve lifting ability    Time 3    Period Weeks    Status New             PT Long Term Goals - 10/27/20 1102      PT LONG TERM GOAL #1   Title He will be independent with all HEP issued    Time 6    Period Weeks    Status New      PT LONG TERM GOAL #2   Title He will report pain as intermittant and max 1-2 pain with standing at work and able ot complete days work standing    Time 6    Period Weeks    Status New      PT LONG TERM GOAL #3   Title FOTO score will improve to  80    Time 6    Period Weeks    Status New      PT LONG TERM GOAL #4   Title Trunk flexion to be able to ouch his akles.    Time 6    Period Weeks    Status New                  Plan - 10/27/20 1058    Clinical Impression Statement [name] reports chronic RT sided back pain with pain into anterior thigh. This has been present for  3 months or more. He presents with abnormal posture and limited trunk ROM. He may have a SI component to his pain. He appears to be appropriate for skilled PT and should make some improvement in standing tolerance and general RT back pain.   Personal Factors and Comorbidities Time since onset of injury/illness/exacerbation    Examination-Activity Limitations Bed Mobility;Bend;Sleep;Stand    Examination-Participation Restrictions Occupation;Community Activity    Stability/Clinical Decision Making Stable/Uncomplicated    Clinical Decision Making Low    Rehab Potential Good    PT Frequency 2x / week    PT Duration 6 weeks    PT Treatment/Interventions Electrical Stimulation;Moist Heat;Therapeutic  exercise;Therapeutic activities;Patient/family education;Passive range of motion;Dry needling;Manual techniques    PT Next Visit Plan Review HEP , progress as needed , manual and modalities ,    PT Home Exercise Plan prone on elbows,  SKC RT , piriformis RT.    Consulted and Agree with Plan of Care Patient           Patient will benefit from skilled therapeutic intervention in order to improve the following deficits and impairments:  Postural dysfunction,Pain,Decreased activity tolerance,Increased muscle spasms,Decreased range of motion  Visit Diagnosis: Radiculopathy, lumbar region  Muscle spasm of back     Problem List Patient Active Problem List   Diagnosis Date Noted  . Right leg pain 09/05/2020  . Chronic radicular low back pain 09/05/2020  . Benign prostatic hyperplasia with urinary frequency 09/05/2020  . Chronic right-sided low back pain 09/03/2019  . Abnormal blood cell count 07/30/2019  . Gross hematuria 07/30/2019  . Prostatitis 07/30/2019  . Elevated blood-pressure reading without diagnosis of hypertension 07/30/2019  . Elevated PSA 04/02/2019  . Drop in hemoglobin 04/02/2019  . Vaccine counseling 02/03/2019  . Abnormal EKG 11/28/2017  . Polyarthralgia 11/28/2017  . Encounter for health maintenance examination in adult 01/02/2016  . Special screening for malignant neoplasms, colon 01/02/2016  . Screening for prostate cancer 01/02/2016  . Benign prostatic hyperplasia 01/02/2016  . Nocturia 01/02/2016  . Lipoma of extremity 01/02/2016  . Epididymal cyst 07/07/2008  . Smoker 04/28/2008    Darrel Hoover  PT 10/27/2020, 11:58 AM  [facility] [address] [place], [place], [address] Phone: [phone]   Fax:  [phone]  Name: Jorge Walker MRN: [id number] Date of Birth: 06/25/1966

## 2020-10-30 ENCOUNTER — Other Ambulatory Visit: Payer: Self-pay

## 2020-10-30 ENCOUNTER — Telehealth: Payer: Self-pay

## 2020-10-30 ENCOUNTER — Telehealth: Payer: Self-pay | Admitting: Orthopaedic Surgery

## 2020-10-30 MED ORDER — TAMSULOSIN HCL 0.4 MG PO CAPS: 0.8000 mg | ORAL_CAPSULE | Freq: Every day | ORAL | 1 refills | Status: DC

## 2020-10-30 NOTE — Telephone Encounter (Signed)
Medication has been sent.  

## 2020-10-30 NOTE — Telephone Encounter (Signed)
Pt. Called stating he wanted to know if he could get his tamusolin refilled to the [facility] on [address]. Pt. Last apt was 09/29/20 and next apt is 12/29/20.

## 2020-10-30 NOTE — Telephone Encounter (Signed)
Patient called requesting a stronger pain medication. Patient states that the tramadol is not touching the pain. Patient states the pain is unbearable. Please send stronger medication to pharmacy on file. Please call patient when medication has been called in. Patient phone number is [phone].

## 2020-10-31 ENCOUNTER — Other Ambulatory Visit: Payer: Self-pay | Admitting: Physician Assistant

## 2020-10-31 MED ORDER — HYDROCODONE-ACETAMINOPHEN 5-325 MG PO TABS: 1.0000 | ORAL_TABLET | Freq: Two times a day (BID) | ORAL | 0 refills | Status: DC | PRN

## 2020-10-31 NOTE — Telephone Encounter (Signed)
See message from 2/23.  Have we ordered MRI lumbar spine?  I can call in one small narcotic rx, but please again let him know that if he needs refill, he will need to get from pcp

## 2020-11-01 NOTE — Telephone Encounter (Signed)
Called patient no answer. LMOM. Just need to advise msg below.  Jorge Walker, He is scheduled to have MRI 11/05/2020.

## 2020-11-02 NOTE — Telephone Encounter (Signed)
LMOM

## 2020-11-02 NOTE — Telephone Encounter (Signed)
Ok, thanks.

## 2020-11-05 ENCOUNTER — Other Ambulatory Visit: Payer: Self-pay

## 2020-11-05 ENCOUNTER — Ambulatory Visit
Admission: RE | Admit: 2020-11-05 | Discharge: 2020-11-05 | Disposition: A | Discharge status: Home / Self Care | Payer: 59 | Source: Ambulatory Visit | Attending: Orthopaedic Surgery | Admitting: Orthopaedic Surgery

## 2020-11-05 DIAGNOSIS — M5416 Radiculopathy, lumbar region: Secondary | ICD-10-CM

## 2020-11-05 IMAGING — MR MR LUMBAR SPINE W/O CM
4 of 5 series · 25 of 48 positions shown · non-contrast
Comparison: Lumbar spine radiographs [DATE]

CLINICAL DATA: Low back pain radiating to the right lower extremity
for 5-6 months.

EXAM:
MRI LUMBAR SPINE WITHOUT CONTRAST
TECHNIQUE: Multiplanar, multisequence MR imaging of the lumbar spine was
performed. No intravenous contrast was administered.

[Series 5: T2 · sagittal · 4.0mm · 0.53mm/px · 6 of 15 slices shown (1 of 2)]
[im 1/15]
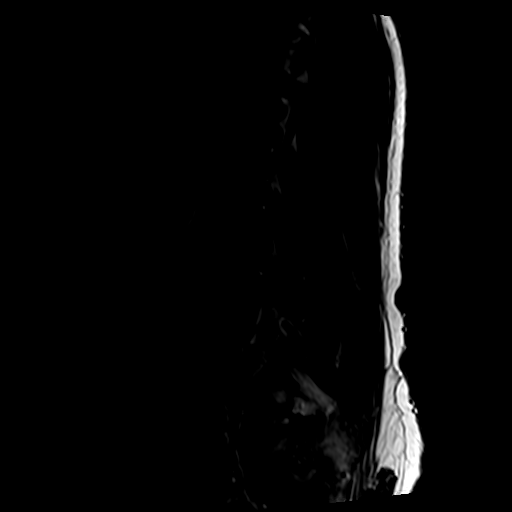
[im 3/15]
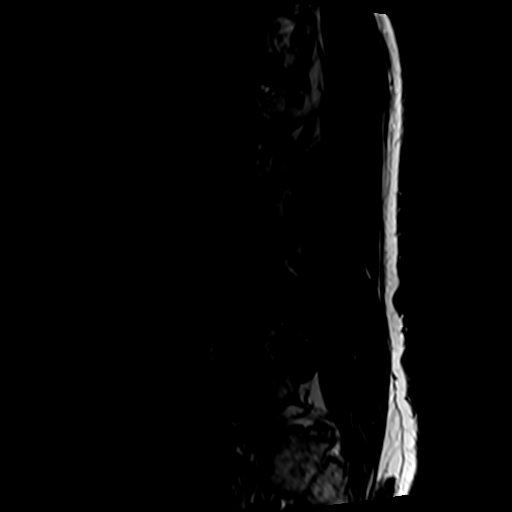
[im 6/15]
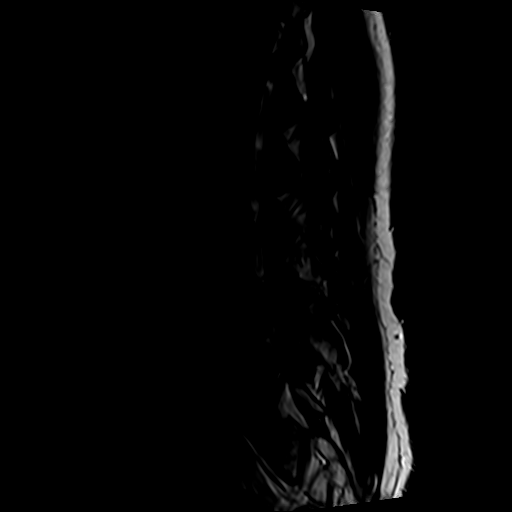
[im 9/15]
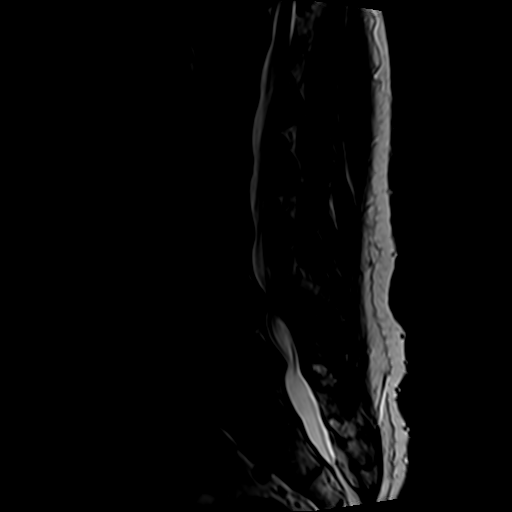
[im 12/15]
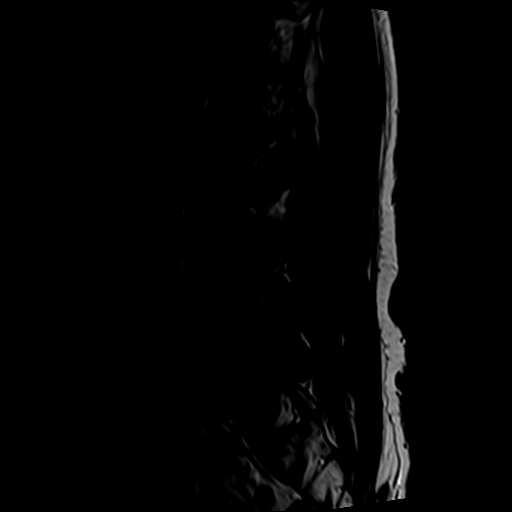
[im 15/15]
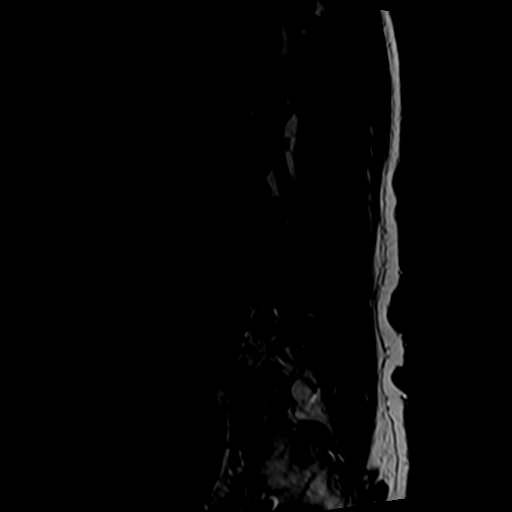

[Series 6: T1 · sagittal · 4.0mm · 0.53mm/px · 6 of 15 slices shown (1 of 2)]
[im 1/15]
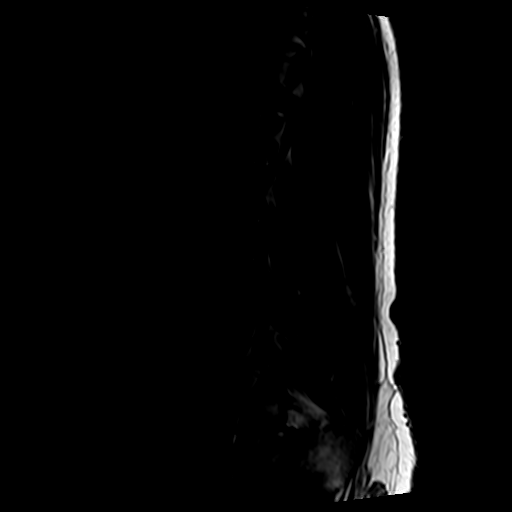
[im 3/15]
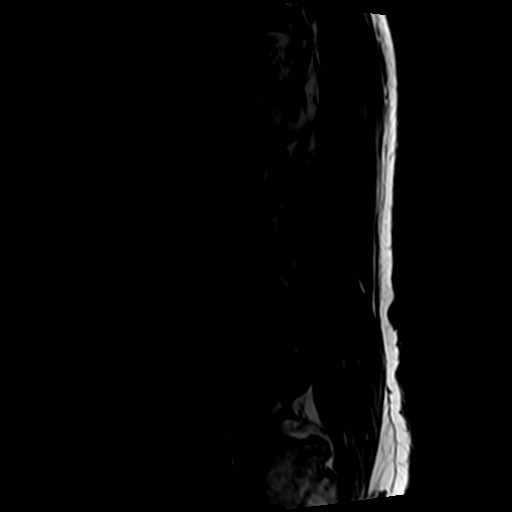
[im 6/15]
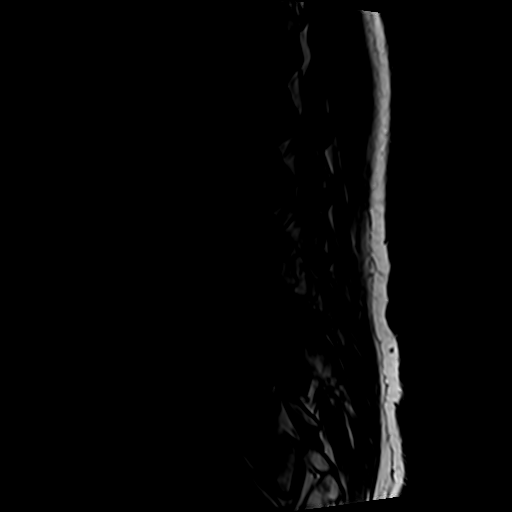
[im 9/15]
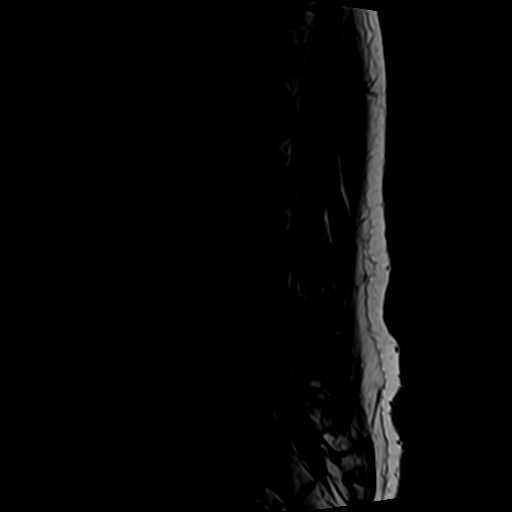
[im 12/15]
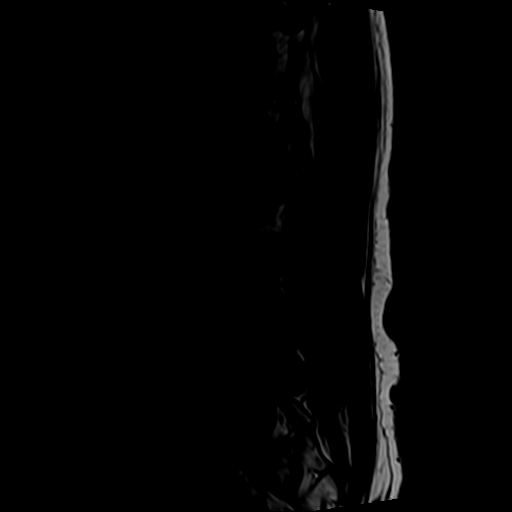
[im 15/15]
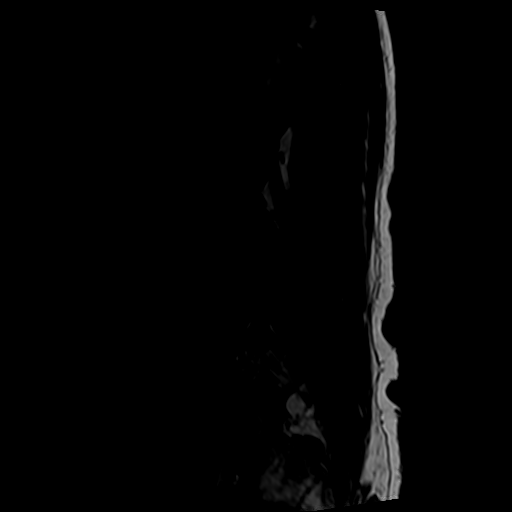

[Series 8: T2 · axial · 4.0mm · 0.70mm/px · z∈[-167,+86]mm · 9 of 36 slices shown (2 of 2)]
[im 1/36]
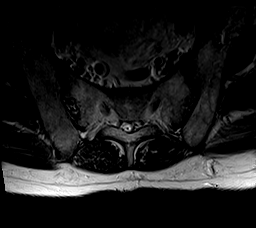
[im 6/36]
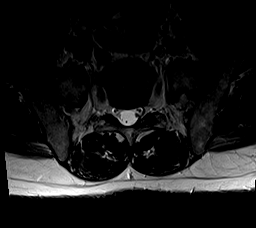
[im 11/36]
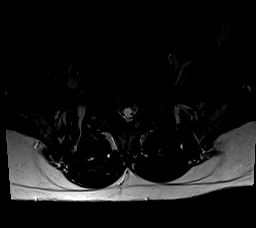
[im 16/36]
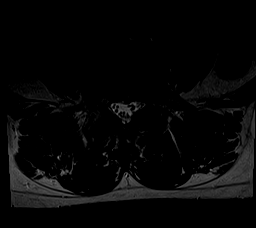
[im 18/36]
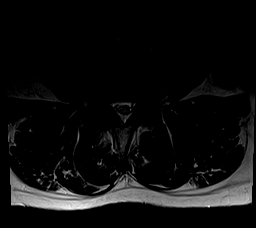
[im 21/36]
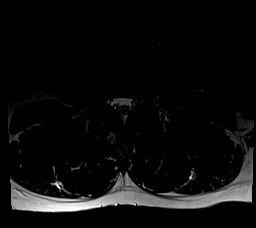
[im 26/36]
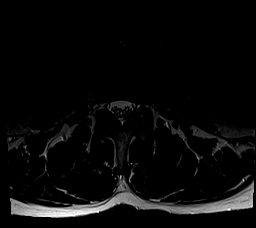
[im 31/36]
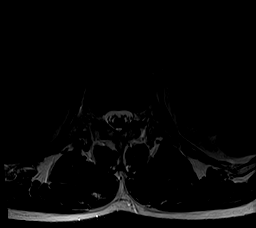
[im 36/36]
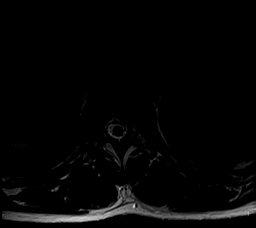

[Series 9: T1 · axial · 4.0mm · 0.35mm/px · z∈[-167,+44]mm · 4 of 36 slices shown (2 of 2)]
[im 1/36]
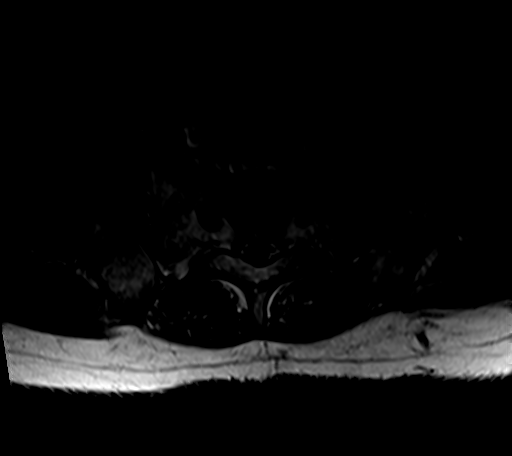
[im 6/36]
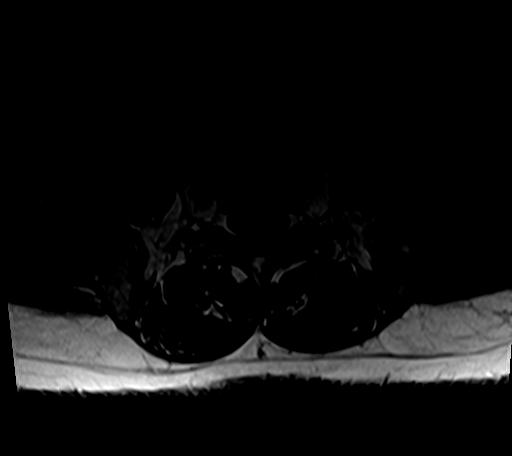
[im 18/36]
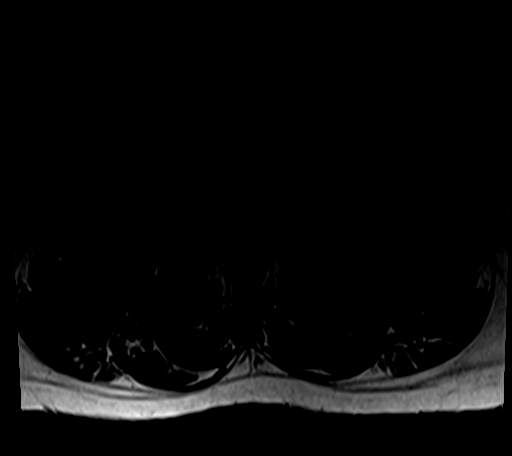
[im 31/36]
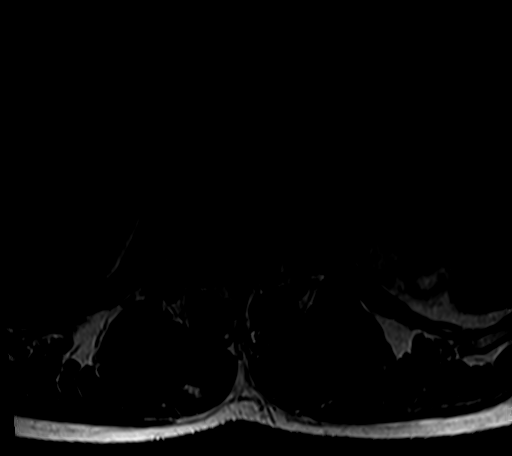

[25 of 48 positions shown; findings below may reference images not displayed]

FINDINGS: Segmentation: Transitional lumbosacral anatomy with sacralization of
L5. Hypoplastic L5-S1 disc.

Alignment:  Trace retrolisthesis of L4 on L5.

Vertebrae: No fracture or suspicious osseous lesion. Right greater
than left facet edema at L3-4. Degenerative endplate changes
anteriorly at T11-12.

Conus medullaris and cauda equina: Conus extends to the L1 level.
Conus and cauda equina appear normal.

Paraspinal and other soft tissues: Partially visualized 2.5 cm T2
hyperintense focus in the posterior right hepatic lobe, possibly a
cyst or hemangioma but incompletely evaluated. Left pelvic kidney
with a partially visualized 1.6 cm cyst.

Disc levels:

T11-12: Disc desiccation and anterior spondylosis. No disc
herniation or stenosis.

T12-L1 and L1-2: Negative.

L2-3: Minimal disc bulging and mild facet hypertrophy result in
minimal right and mild left neural foraminal stenosis without spinal
stenosis.

L3-4: Disc bulging, a right foraminal and extraforaminal disc
protrusion, and moderate facet hypertrophy result in mild spinal
stenosis, mild right and moderate left lateral recess stenosis, and
moderate to severe right greater than left neural foraminal stenosis
with potential bilateral L3 nerve root impingement, particularly on
the right secondary to the disc protrusion.

L4-5: Disc bulging and moderate facet hypertrophy result in mild
spinal stenosis, moderate to severe right greater than left lateral
recess stenosis, and moderate left greater than right neural
foraminal stenosis.

L5-S1: Transitional anatomy.  No stenosis.
IMPRESSION: 1. Transitional lumbosacral anatomy as above.
2. Right foraminal and extraforaminal disc protrusion at L3-4 with
moderate to severe neural foraminal stenosis and L3 nerve root
impingement.
3. Moderate to severe lateral recess and moderate neural foraminal
stenosis at L4-5.

## 2020-11-06 ENCOUNTER — Telehealth: Payer: Self-pay

## 2020-11-06 ENCOUNTER — Telehealth: Payer: Self-pay | Admitting: Medical

## 2020-11-06 ENCOUNTER — Other Ambulatory Visit: Payer: Self-pay | Admitting: Medical

## 2020-11-06 NOTE — Telephone Encounter (Signed)
Patient called he is requesting rx refill for hydrocodone he is requesting more tablets due to him being prescribed 10 and having to take 2 a day call back:[phone]

## 2020-11-06 NOTE — Telephone Encounter (Signed)
Pt called and said he switched pharmacies he will now be using [facility] and their fax number is [phone]. He also stated that he is still having back pain and was prescribed  Hydrocodone by Dwana Melena. He said it works for about 3 hours and the pain is back. He was only prescribed 10 pills so he is out now. Pt was advised to call the Dr that prescribed the medication but he said he wanted to check with you first. He said the Tramadol didn't help at all

## 2020-11-06 NOTE — Telephone Encounter (Signed)
We had prescribed some medication to help until he got in with orthopedics.  Once we have referred to orthopedics they have taken over the evaluation and management of the pain.  My understanding he is awaiting MRI results from them  I also see with a prescribed 45 tablets of tramadol in late February.  Does he have any of this left?  They recently prescribed a short supply of narcotic hydrocodone?  We at this office generally do not prescribe narcotics ongoing.  I do recommend he call orthopedic office to see what the next steps are?  Not sure if they are doing therapy or procedure or what?

## 2020-11-06 NOTE — Telephone Encounter (Signed)
Per previous messages, cannot refill narcotic.  Will need to get from pcp or we can refer to pain clinic

## 2020-11-07 ENCOUNTER — Telehealth: Payer: Self-pay | Admitting: Internal Medicine

## 2020-11-07 MED ORDER — NAPROXEN 500 MG PO TABS: 500.0000 mg | ORAL_TABLET | Freq: Two times a day (BID) | ORAL | 0 refills | Status: DC

## 2020-11-07 MED ORDER — VITAMIN D 25 MCG (1000 UNIT) PO TABS: 1000.0000 [IU] | ORAL_TABLET | Freq: Every day | ORAL | 3 refills | Status: DC

## 2020-11-07 MED ORDER — TAMSULOSIN HCL 0.4 MG PO CAPS: 0.8000 mg | ORAL_CAPSULE | Freq: Every day | ORAL | 1 refills | Status: DC

## 2020-11-07 MED ORDER — FERROUS GLUCONATE 324 (38 FE) MG PO TABS: 324.0000 mg | ORAL_TABLET | Freq: Every day | ORAL | 1 refills | Status: DC

## 2020-11-07 MED ORDER — CALCIUM CARBONATE 600 MG PO TABS: 600.0000 mg | ORAL_TABLET | Freq: Two times a day (BID) | ORAL | 3 refills | Status: DC

## 2020-11-07 NOTE — Telephone Encounter (Signed)
Patient has been informed and he said he spoke to ortho and they told him to call us for pain. Pt stated he is dong physical therapy which he started last week but he does not think it will help.

## 2020-11-07 NOTE — Telephone Encounter (Signed)
I am happy to refill his tramadol, but again, will not refill the narcotic (norco).  If he needs something stronger than we can refer him to pain management.

## 2020-11-07 NOTE — Telephone Encounter (Signed)
Genera -  Please see prior message as well  When we saw him we gave him a temporary supply of some pain medicine to help ease the pain in the short-term while he was getting into see orthopedics.  This was short-term medication.  I do not prescribe chronic narcotic pain medication/ongoing refills on that type of medication!    When I refer to orthopedics for a condition, the assumption is that the specialist is going to evaluate his situation and give treatment recommendations.  As mentioned in the prior message, he was recently given some additional pain medication at the orthopedic office.  I believe he is awaiting results from his MRI  I have no other medications to prescribe him for pain as I cannot continue giving him narcotic pain medication.  I assume orthopedics is going to instruct him on their recommendations which could include other neuropathic pain medicines /non narcotic medications, or other remedies they may recommend.  Please ask him to follow with orthopedics.   I am copying Dr. Erlinda Hong and Dwana Melena  PA on this message.

## 2020-11-07 NOTE — Telephone Encounter (Signed)
Patient has been advised to follow up with ortho where provider in willing to refill tramadol but pt advised there will be no refill on narcotic but the option to be referred to pain management.

## 2020-11-07 NOTE — Telephone Encounter (Signed)
It looks like he has real pathology on MRI.  We can send in 10 tabs of norco to give him some relief while he's waiting for ESI.  He has f/u appt to see [place] in a week but it's probably best just to let him know of the results and put in referral to [facility].  Thanks.

## 2020-11-07 NOTE — Telephone Encounter (Signed)
Refilled meds per shane to mail order

## 2020-11-08 ENCOUNTER — Other Ambulatory Visit: Payer: Self-pay

## 2020-11-08 ENCOUNTER — Telehealth: Payer: Self-pay | Admitting: Physician Assistant

## 2020-11-08 ENCOUNTER — Other Ambulatory Visit: Payer: Self-pay | Admitting: Physician Assistant

## 2020-11-08 ENCOUNTER — Ambulatory Visit: Payer: 59 | Admitting: Rehabilitative and Restorative Service Providers"

## 2020-11-08 DIAGNOSIS — M5416 Radiculopathy, lumbar region: Secondary | ICD-10-CM

## 2020-11-08 MED ORDER — HYDROCODONE-ACETAMINOPHEN 5-325 MG PO TABS: 1.0000 | ORAL_TABLET | Freq: Two times a day (BID) | ORAL | 0 refills | Status: DC | PRN

## 2020-11-08 NOTE — Telephone Encounter (Signed)
Ok i'll send in again.  do you want me to continue filling norco until esi?  I sent in a rx one week ago.  I'll also go ahead and call with results.  Kathlee Nations, can you put in referral to [facility] for esi?

## 2020-11-08 NOTE — Telephone Encounter (Signed)
ORDER MADE 

## 2020-11-08 NOTE — Telephone Encounter (Signed)
Left vm with patient to call back to go over mri. Can you refer to dr. Ernestina Patches for esi?

## 2020-11-10 ENCOUNTER — Other Ambulatory Visit: Payer: Self-pay

## 2020-11-10 MED ORDER — TAMSULOSIN HCL 0.4 MG PO CAPS: 0.8000 mg | ORAL_CAPSULE | Freq: Every day | ORAL | 0 refills | Status: DC

## 2020-11-14 ENCOUNTER — Other Ambulatory Visit: Payer: Self-pay

## 2020-11-14 ENCOUNTER — Ambulatory Visit (INDEPENDENT_AMBULATORY_CARE_PROVIDER_SITE_OTHER): Payer: 59 | Admitting: Orthopaedic Surgery

## 2020-11-14 DIAGNOSIS — M5416 Radiculopathy, lumbar region: Secondary | ICD-10-CM

## 2020-11-14 MED ORDER — TAMSULOSIN HCL 0.4 MG PO CAPS: 0.8000 mg | ORAL_CAPSULE | Freq: Every day | ORAL | 0 refills | Status: DC

## 2020-11-14 NOTE — Progress Notes (Signed)
Office Visit Note   Patient: Jorge Walker           Date of Birth: 03-05-1966           MRN: [id number] Visit Date: 11/14/2020              Requested by: Carlena Hurl, PA-C [address] [place],  [place] [address] PCP: Carlena Hurl, PA-C   Assessment & Plan: Visit Diagnoses:  1. Radiculopathy, lumbar region     Plan: MRI shows stenosis at L3-4 and L4L5.  These findings were reviewed with the patient and based on our discussion of treatment options he is elected to be referred to Dr. Ernestina Patches for lumbar spine ESI.  Questions encouraged and answered.  Follow-Up Instructions: Return if symptoms worsen or fail to improve.   Orders:  No orders of the defined types were placed in this encounter.  No orders of the defined types were placed in this encounter.     Procedures: No procedures performed   Clinical Data: No additional findings.   Subjective: No chief complaint on file.   Mr. Cicero returns today for MRI review of the lumbar spine.  No changes reported.   Review of Systems  Constitutional: Negative.   All other systems reviewed and are negative.    Objective: Vital Signs: There were no vitals taken for this visit.  Physical Exam Vitals and nursing note reviewed.  Constitutional:      Appearance: He is well-developed.  Pulmonary:     Effort: Pulmonary effort is normal.  Abdominal:     Palpations: Abdomen is soft.  Skin:    General: Skin is warm.  Neurological:     Mental Status: He is alert and oriented to person, place, and time.  Psychiatric:        Behavior: Behavior normal.        Thought Content: Thought content normal.        Judgment: Judgment normal.     Ortho Exam Lumbar spine exam is unchanged. Specialty Comments:  No specialty comments available.  Imaging: No results found.   PMFS History: Patient Active Problem List   Diagnosis Date Noted  . Right leg pain 09/05/2020  . Chronic radicular low back  pain 09/05/2020  . Benign prostatic hyperplasia with urinary frequency 09/05/2020  . Chronic right-sided low back pain 09/03/2019  . Abnormal blood cell count 07/30/2019  . Gross hematuria 07/30/2019  . Prostatitis 07/30/2019  . Elevated blood-pressure reading without diagnosis of hypertension 07/30/2019  . Elevated PSA 04/02/2019  . Drop in hemoglobin 04/02/2019  . Vaccine counseling 02/03/2019  . Abnormal EKG 11/28/2017  . Polyarthralgia 11/28/2017  . Encounter for health maintenance examination in adult 01/02/2016  . Special screening for malignant neoplasms, colon 01/02/2016  . Screening for prostate cancer 01/02/2016  . Benign prostatic hyperplasia 01/02/2016  . Nocturia 01/02/2016  . Lipoma of extremity 01/02/2016  . Epididymal cyst 07/07/2008  . Smoker 04/28/2008   Past Medical History:  Diagnosis Date  . Allergy   . Arthritis    finger  . BPH (benign prostatic hypertrophy) 2020   BPH with elevated PSA  . Epididymal cyst   . Tobacco use disorder     Family History  Problem Relation Age of Onset  . Hypertension Mother   . Diabetes Mother   . Pneumonia Father        died of pneumonia  . Cancer Neg Hx   . Heart disease Neg Hx   .  Stroke Neg Hx   . Colon cancer Neg Hx   . Colon polyps Neg Hx   . Esophageal cancer Neg Hx   . Rectal cancer Neg Hx   . Stomach cancer Neg Hx     Past Surgical History:  Procedure Laterality Date  . ARM WOUND REPAIR / CLOSURE     right upper posterior arm - teenager, s/p trauma  . ARTERY REPAIR     right upper arm - teenager, s/p trauma to right upper arm  . COLONOSCOPY  01/24/2016   Diverticulosis in left and right colon.   Repeat 10 years.  Dr. Scarlette Shorts  . ELBOW SURGERY  2019   Social History   Occupational History  . Not on file  Tobacco Use  . Smoking status: Current Every Day Smoker    Packs/day: 0.50    Years: 17.00    Pack years: 8.50    Types: Cigarettes  . Smokeless tobacco: Never Used  Vaping Use  . Vaping  Use: Never used  Substance and Sexual Activity  . Alcohol use: Yes    Alcohol/week: 5.0 standard drinks    Types: 5 Shots of liquor per week    Comment: weekends   . Drug use: No  . Sexual activity: Not on file

## 2020-11-15 ENCOUNTER — Ambulatory Visit: Payer: 59 | Admitting: Rehabilitative and Restorative Service Providers"

## 2020-11-17 ENCOUNTER — Telehealth: Payer: Self-pay

## 2020-11-17 ENCOUNTER — Ambulatory Visit: Payer: 59

## 2020-11-17 NOTE — Telephone Encounter (Signed)
Pt answered phone. Informed him he missed his appointment at 1:15pm today. He said, "Oh, honey, I've got a lot going on today. Can I get a reschedule?" Confirmed patient's next 2 appointments with him.  Jorge Walker. Yvette Rack, PT, DPT

## 2020-11-20 ENCOUNTER — Encounter: Payer: Self-pay | Admitting: Physical Medicine and Rehabilitation

## 2020-11-20 ENCOUNTER — Ambulatory Visit: Payer: 59 | Admitting: Physical Therapy

## 2020-11-20 ENCOUNTER — Other Ambulatory Visit: Payer: Self-pay

## 2020-11-20 DIAGNOSIS — R293 Abnormal posture: Secondary | ICD-10-CM

## 2020-11-20 DIAGNOSIS — M5416 Radiculopathy, lumbar region: Secondary | ICD-10-CM

## 2020-11-20 DIAGNOSIS — M62830 Muscle spasm of back: Secondary | ICD-10-CM

## 2020-11-21 ENCOUNTER — Encounter: Payer: Self-pay | Admitting: Physical Therapy

## 2020-11-21 NOTE — Therapy (Signed)
[place], [place], [address] Phone: [phone]   Fax:  [phone]  Physical Therapy Treatment  Patient Details  Name: Jorge Walker MRN: [id number] Date of Birth: Sep 03, 1965 Referring Provider (PT): Dwana Melena, [facility]   Encounter Date: 11/20/2020   PT End of Session - 11/21/20 0955    Visit Number 2    Number of Visits 12    Date for PT Re-Evaluation 12/08/20    Authorization Type [employer]    PT Start Time 1633    PT Stop Time 1712    PT Time Calculation (min) 39 min    Activity Tolerance No increased pain    Behavior During Therapy [facility] for tasks assessed/performed           Past Medical History:  Diagnosis Date  . Allergy   . Arthritis    finger  . BPH (benign prostatic hypertrophy) 2020   BPH with elevated PSA  . Epididymal cyst   . Tobacco use disorder     Past Surgical History:  Procedure Laterality Date  . ARM WOUND REPAIR / CLOSURE     right upper posterior arm - teenager, s/p trauma  . ARTERY REPAIR     right upper arm - teenager, s/p trauma to right upper arm  . COLONOSCOPY  01/24/2016   Diverticulosis in left and right colon.   Repeat 10 years.  Dr. Scarlette Shorts  . ELBOW SURGERY  2019    There were no vitals filed for this visit.   Subjective Assessment - 11/21/20 0949    Subjective Patient continues to have lower back pain thatradiates down into his right lower extrimy. Patient had an injection which did not help his pain much. he has been shifting weight off his right leg at work to rleive pain.    Limitations Standing    How long can you sit comfortably? as needed    Diagnostic tests degenerativ e changes and one segment retrolisthisis    Currently in Pain? Yes    Pain Score 5     Pain Location Back    Pain Orientation Right    Pain Descriptors / Indicators Aching    Pain Type Chronic pain    Pain Radiating Towards into right anterior thigh    Pain Onset More than  a month ago    Pain Frequency Intermittent    Aggravating Factors  standing and lying down in bed    Pain Relieving Factors sitting    Effect of Pain on Daily Activities pain standing and walking                             [facility] Adult PT Treatment/Exercise - 11/21/20 0001      Exercises   Exercises Lumbar      Lumbar Exercises: Stretches   Lower Trunk Rotation Limitations x20 patien reports he has stiffness in the morning    Prone on Elbows Stretch Limitations reviewed prone on elbows for 1 minute. he had no radicular pain so it was difficut to assess benefits. Showed patient anatomy and though behind progre positioning    Piriformis Stretch Limitations 3x20 sec hold    Other Lumbar Stretch Exercise trigger point release with tennis ball. Showed patients spots that he should find trigger points      Manual Therapy   Manual Therapy Soft tissue mobilization;Manual Traction    Soft tissue mobilization to right lumbar  spine and QL    Manual Traction lught LAD to right LE Grade III and IV occiltions                  PT Education - 11/21/20 0953    Education Details reviewed HEP and symptom management    Person(s) Educated Patient    Methods Explanation;Demonstration;Verbal cues;Tactile cues    Comprehension Verbalized understanding;Returned demonstration;Verbal cues required;Tactile cues required            PT Short Term Goals - 10/27/20 1059      PT SHORT TERM GOAL #1   Title Patient will be independent with HEP to maintain progress in PT    Time 3    Period Weeks    Status New      PT SHORT TERM GOAL #2   Title Patient will report < or = 2-3/10 LBP    Time 3    Period Weeks    Status New      PT SHORT TERM GOAL #3   Title Patient will exhibit improved posture to reduce stress on shoulders/neck and improve lifting ability    Time 3    Period Weeks    Status New             PT Long Term Goals - 10/27/20 1102      PT LONG TERM GOAL  #1   Title He will be independent with all HEP issued    Time 6    Period Weeks    Status New      PT LONG TERM GOAL #2   Title He will report pain as intermittant and max 1-2 pain with standing at work and able ot complete days work standing    Time 6    Period Weeks    Status New      PT LONG TERM GOAL #3   Title FOTO score will improve to  80    Time 6    Period Weeks    Status New      PT LONG TERM GOAL #4   Title Trunk flexion to be able to ouch his akles.    Time 6    Period Weeks    Status New                 Plan - 11/21/20 0956    Clinical Impression Statement Patient was given basic HEP to [facility] on at home. He has been having radicular pain when he is lying down. His MRI shows buldging discs at two levels with possible L3 nerve root inflamation. He was shown how to do prone on elbows in low range. he was educated oncentraliztion and what to look for. He was advised if it makes it worse or does not change his symptoms to hold. he was given stretching and a tennis ball for trigger point release. he was also given basic core stabilization exercises. he had some difficulty with supine hip flexion. He had speaming in his Q:L and lower lumbar paraspinals on the right.    Personal Factors and Comorbidities Time since onset of injury/illness/exacerbation    Examination-Activity Limitations Bed Mobility;Bend;Sleep;Stand    Examination-Participation Restrictions Occupation;Community Activity    Stability/Clinical Decision Making Stable/Uncomplicated    Clinical Decision Making Low    Rehab Potential Good    PT Frequency 2x / week    PT Duration 6 weeks    PT Treatment/Interventions Electrical Stimulation;Moist Heat;Therapeutic exercise;Therapeutic activities;Patient/family education;Passive range of motion;Dry  needling;Manual techniques    PT Next Visit Plan Review HEP , progress as needed , manual and modalities ,    PT Home Exercise Plan prone on elbows,  SKC RT ,  piriformis RT.    Consulted and Agree with Plan of Care Patient           Patient will benefit from skilled therapeutic intervention in order to improve the following deficits and impairments:  Postural dysfunction,Pain,Decreased activity tolerance,Increased muscle spasms,Decreased range of motion  Visit Diagnosis: Radiculopathy, lumbar region  Muscle spasm of back  Abnormal posture     Problem List Patient Active Problem List   Diagnosis Date Noted  . Right leg pain 09/05/2020  . Chronic radicular low back pain 09/05/2020  . Benign prostatic hyperplasia with urinary frequency 09/05/2020  . Chronic right-sided low back pain 09/03/2019  . Abnormal blood cell count 07/30/2019  . Gross hematuria 07/30/2019  . Prostatitis 07/30/2019  . Elevated blood-pressure reading without diagnosis of hypertension 07/30/2019  . Elevated PSA 04/02/2019  . Drop in hemoglobin 04/02/2019  . Vaccine counseling 02/03/2019  . Abnormal EKG 11/28/2017  . Polyarthralgia 11/28/2017  . Encounter for health maintenance examination in adult 01/02/2016  . Special screening for malignant neoplasms, colon 01/02/2016  . Screening for prostate cancer 01/02/2016  . Benign prostatic hyperplasia 01/02/2016  . Nocturia 01/02/2016  . Lipoma of extremity 01/02/2016  . Epididymal cyst 07/07/2008  . Smoker 04/28/2008    Carney Living PT DPT  11/21/2020, 10:08 AM  [facility] [address] [place], [place], [address] Phone: [phone]   Fax:  [phone]  Name: Jorge Walker MRN: [id number] Date of Birth: 06/05/1966

## 2020-11-23 ENCOUNTER — Ambulatory Visit: Payer: 59 | Admitting: Physical Therapy

## 2020-11-23 ENCOUNTER — Other Ambulatory Visit: Payer: Self-pay

## 2020-11-23 ENCOUNTER — Encounter: Payer: Self-pay | Admitting: Physical Therapy

## 2020-11-23 DIAGNOSIS — M62830 Muscle spasm of back: Secondary | ICD-10-CM

## 2020-11-23 DIAGNOSIS — G8929 Other chronic pain: Secondary | ICD-10-CM

## 2020-11-23 DIAGNOSIS — R293 Abnormal posture: Secondary | ICD-10-CM

## 2020-11-23 DIAGNOSIS — M5416 Radiculopathy, lumbar region: Secondary | ICD-10-CM | POA: Diagnosis not present

## 2020-11-24 ENCOUNTER — Encounter: Payer: Self-pay | Admitting: Physical Therapy

## 2020-11-24 NOTE — Therapy (Addendum)
[place], [place], [address] Phone: [phone]   Fax:  [phone]  Physical Therapy Treatment / Discharge  Patient Details  Name: Jorge Walker MRN: [id number] Date of Birth: October 24, 1965 Referring Provider (PT): Dwana Melena, [facility]   Encounter Date: 11/23/2020   PT End of Session - 11/23/20 1642     Visit Number 3    Number of Visits 12    Date for PT Re-Evaluation 12/08/20    Authorization Type [employer]    PT Start Time 1635    PT Stop Time 6384    PT Time Calculation (min) 40 min    Activity Tolerance No increased pain    Behavior During Therapy [name] for tasks assessed/performed             Past Medical History:  Diagnosis Date   Allergy    Arthritis    finger   BPH (benign prostatic hypertrophy) 2020   BPH with elevated PSA   Epididymal cyst    Tobacco use disorder     Past Surgical History:  Procedure Laterality Date   ARM WOUND REPAIR / CLOSURE     right upper posterior arm - teenager, s/p trauma   ARTERY REPAIR     right upper arm - teenager, s/p trauma to right upper arm   COLONOSCOPY  01/24/2016   Diverticulosis in left and right colon.   Repeat 10 years.  Dr. Scarlette Shorts   ELBOW SURGERY  2019    There were no vitals filed for this visit.   Subjective Assessment - 11/23/20 1639     Subjective Patient reports his low back pain has been severe over the past few days. His pain is going down his right leg.    Limitations Standing    How long can you sit comfortably? as needed    How long can you stand comfortably? some days 4 hours some longer    Diagnostic tests degenerativ e changes and one segment retrolisthisis    Patient Stated Goals Decrease pain    Currently in Pain? Yes    Pain Score 7     Pain Location Back    Pain Orientation Right    Pain Type Chronic pain    Pain Onset More than a month ago    Pain Frequency Intermittent    Aggravating Factors  standing and  lying down in the bed    Pain Relieving Factors resting    Effect of Pain on Daily Activities pain standing and walking                               [facility] Adult PT Treatment/Exercise - 11/24/20 0001       Self-Care   Self-Care Other Self-Care Comments    Other Self-Care Comments  reviewed lifting technique for work. patient has to lift boxes and move them over. he was encouraged to take a step and also encouraged to hainge at the hips to maximize leg strength      Exercises   Exercises Lumbar      Lumbar Exercises: Stretches   Active Hamstring Stretch Limitations reviewed seated for work 3x20 sec hold bilateral    Lower Trunk Rotation Limitations x20 patien reports he has stiffness in the morning    Piriformis Stretch Limitations 3x20 sec hold    Other Lumbar Stretch Exercise trigger point release with tennis ball. Showed patients spots  that he should find trigger points      Lumbar Exercises: Seated   LAQ on Chair Limitations 2x15 each leg      Manual Therapy   Manual Therapy Soft tissue mobilization;Manual Traction    Soft tissue mobilization to right lumbar spine and QL and gluteal    Manual Traction light LAD toLE Grade III and IV occiltions bilateral                    PT Education - 11/23/20 1642     Education Details reviewed benefits and risks of TPDN    Person(s) Educated Patient    Methods Explanation;Demonstration;Tactile cues;Verbal cues    Comprehension Verbalized understanding;Returned demonstration;Verbal cues required;Tactile cues required              PT Short Term Goals - 10/27/20 1059       PT SHORT TERM GOAL #1   Title Patient will be independent with HEP to maintain progress in PT    Time 3    Period Weeks    Status New      PT SHORT TERM GOAL #2   Title Patient will report < or = 2-3/10 LBP    Time 3    Period Weeks    Status New      PT SHORT TERM GOAL #3   Title Patient will exhibit improved posture to  reduce stress on shoulders/neck and improve lifting ability    Time 3    Period Weeks    Status New               PT Long Term Goals - 10/27/20 1102       PT LONG TERM GOAL #1   Title He will be independent with all HEP issued    Time 6    Period Weeks    Status New      PT LONG TERM GOAL #2   Title He will report pain as intermittant and max 1-2 pain with standing at work and able ot complete days work standing    Time 6    Period Weeks    Status New      PT LONG TERM GOAL #3   Title FOTO score will improve to  80    Time 6    Period Weeks    Status New      PT LONG TERM GOAL #4   Title Trunk flexion to be able to ouch his akles.    Time 6    Period Weeks    Status New                   Plan - 11/23/20 1658     Clinical Impression Statement Patient was limited today. He had large trigger points in his QL and gluteal. He declined TPDN stating he tried it before and it didn't work. He did not respod well to manual therapy. Light LAD with occilations caused increased pain down his leg. He had low tolerance to light exercises. We will continue to work on manual therapy and exercises as toelrated. He will have an injection next week.    Personal Factors and Comorbidities Time since onset of injury/illness/exacerbation    Examination-Activity Limitations Bed Mobility;Bend;Sleep;Stand    Examination-Participation Restrictions Occupation;Community Activity    Stability/Clinical Decision Making Stable/Uncomplicated    Clinical Decision Making Low    Rehab Potential Good    PT Frequency 2x / week  PT Duration 6 weeks    PT Treatment/Interventions Electrical Stimulation;Moist Heat;Therapeutic exercise;Therapeutic activities;Patient/family education;Passive range of motion;Dry needling;Manual techniques    PT Next Visit Plan Review HEP , progress as needed , manual and modalities ,    PT Home Exercise Plan prone on elbows,  SKC RT , piriformis RT.    Consulted  and Agree with Plan of Care Patient             Patient will benefit from skilled therapeutic intervention in order to improve the following deficits and impairments:  Postural dysfunction,Pain,Decreased activity tolerance,Increased muscle spasms,Decreased range of motion  Visit Diagnosis: Radiculopathy, lumbar region  Muscle spasm of back  Abnormal posture  Chronic left shoulder pain     Problem List Patient Active Problem List   Diagnosis Date Noted   Right leg pain 09/05/2020   Chronic radicular low back pain 09/05/2020   Benign prostatic hyperplasia with urinary frequency 09/05/2020   Chronic right-sided low back pain 09/03/2019   Abnormal blood cell count 07/30/2019   Gross hematuria 07/30/2019   Prostatitis 07/30/2019   Elevated blood-pressure reading without diagnosis of hypertension 07/30/2019   Elevated PSA 04/02/2019   Drop in hemoglobin 04/02/2019   Vaccine counseling 02/03/2019   Abnormal EKG 11/28/2017   Polyarthralgia 11/28/2017   Encounter for health maintenance examination in adult 01/02/2016   Special screening for malignant neoplasms, colon 01/02/2016   Screening for prostate cancer 01/02/2016   Benign prostatic hyperplasia 01/02/2016   Nocturia 01/02/2016   Lipoma of extremity 01/02/2016   Epididymal cyst 07/07/2008   Smoker 04/28/2008    Carney Living PT DPT  11/24/2020, 9:31 AM  [facility] [address] [place], [place], [address] Phone: [phone]   Fax:  [phone]  Name: Jorge Walker MRN: [id number] Date of Birth: 08/28/1965     PHYSICAL THERAPY DISCHARGE SUMMARY  Visits from Start of Care: 3  Current functional level related to goals / functional outcomes: See goals   Remaining deficits: unknown   Education / Equipment: HEP   Patient agrees to discharge. Patient goals were not met. Patient is being discharged due to not returning since the last  visit.  Kristoffer Leamon PT, DPT, LAT, ATC  02/19/21  11:42 AM

## 2020-11-27 ENCOUNTER — Ambulatory Visit: Payer: 59 | Attending: Physician Assistant | Admitting: Physical Therapy

## 2020-11-29 ENCOUNTER — Ambulatory Visit: Payer: Self-pay

## 2020-11-29 ENCOUNTER — Ambulatory Visit (INDEPENDENT_AMBULATORY_CARE_PROVIDER_SITE_OTHER): Payer: 59 | Admitting: Physical Medicine and Rehabilitation

## 2020-11-29 ENCOUNTER — Encounter: Payer: Self-pay | Admitting: Physical Medicine and Rehabilitation

## 2020-11-29 ENCOUNTER — Other Ambulatory Visit: Payer: Self-pay

## 2020-11-29 VITALS — BP 138/85 | HR 58

## 2020-11-29 DIAGNOSIS — M5416 Radiculopathy, lumbar region: Secondary | ICD-10-CM | POA: Diagnosis not present

## 2020-11-29 MED ORDER — METHYLPREDNISOLONE ACETATE 80 MG/ML IJ SUSP
80.0000 mg | Freq: Once | INTRAMUSCULAR | Status: AC
  Administered 2020-11-29: 80 mg

## 2020-11-29 NOTE — Progress Notes (Signed)
Right L3 Pt state lower back pain that travels down his right leg to his knee. Pt state standing and bending makes the pain worse. Pt state he take pain meds to help ease his pain.   Numeric Pain Rating Scale and Functional Assessment Average Pain 7   In the last MONTH (on 0-10 scale) has pain interfered with the following?  1. General activity like being  able to carry out your everyday physical activities such as walking, climbing stairs, carrying groceries, or moving a chair?  Rating(8)   +Driver, -BT, -Dye Allergies.

## 2020-11-29 NOTE — Patient Instructions (Signed)

## 2020-11-30 ENCOUNTER — Ambulatory Visit: Payer: 59 | Admitting: Physical Therapy

## 2020-12-05 NOTE — Procedures (Signed)
Lumbosacral Transforaminal Epidural Steroid Injection - Sub-Pedicular Approach with Fluoroscopic Guidance  Patient: Jorge Walker      Date of Birth: September 10, 1965 MRN: [id number] PCP: Carlena Hurl, PA-C      Visit Date: 11/29/2020   Universal Protocol:    Date/Time: 11/29/2020  Consent Given By: the patient  Position: PRONE  Additional Comments: Vital signs were monitored before and after the procedure. Patient was prepped and draped in the usual sterile fashion. The correct patient, procedure, and site was verified.   Injection Procedure Details:   Procedure diagnoses: Lumbar radiculopathy [M54.16]    Meds Administered:  Meds ordered this encounter  Medications  . methylPREDNISolone acetate (DEPO-MEDROL) injection 80 mg    Laterality: Right  Location/Site:  L3-L4  Needle:5.0 in., 22 ga.  Short bevel or Quincke spinal needle  Needle Placement: Transforaminal  Findings:    -Comments: Excellent flow of contrast along the nerve, nerve root and into the epidural space.  Procedure Details: After squaring off the end-plates to get a true AP view, the C-arm was positioned so that an oblique view of the foramen as noted above was visualized. The target area is just inferior to the "nose of the scotty dog" or sub pedicular. The soft tissues overlying this structure were infiltrated with 2-3 ml. of 1% Lidocaine without Epinephrine.  The spinal needle was inserted toward the target using a "trajectory" view along the fluoroscope beam.  Under AP and lateral visualization, the needle was advanced so it did not puncture dura and was located close the 6 O'Clock position of the pedical in AP tracterory. Biplanar projections were used to confirm position. Aspiration was confirmed to be negative for CSF and/or blood. A 1-2 ml. volume of Isovue-250 was injected and flow of contrast was noted at each level. Radiographs were obtained for documentation purposes.   After attaining the  desired flow of contrast documented above, a 0.5 to 1.0 ml test dose of 0.25% Marcaine was injected into each respective transforaminal space.  The patient was observed for 90 seconds post injection.  After no sensory deficits were reported, and normal lower extremity motor function was noted,   the above injectate was administered so that equal amounts of the injectate were placed at each foramen (level) into the transforaminal epidural space.   Additional Comments:  No complications occurred Dressing: 2 x 2 sterile gauze and Band-Aid    Post-procedure details: Patient was observed during the procedure. Post-procedure instructions were reviewed.  Patient left the clinic in stable condition.

## 2020-12-05 NOTE — Progress Notes (Signed)
Jorge Walker - 55 y.o. male MRN [id number]  Date of birth: 07/01/1966  Office Visit Note: Visit Date: 11/29/2020 PCP: Carlena Hurl, PA-C Referred by: Carlena Hurl, PA-C  Subjective: Chief Complaint  Patient presents with  . Lower Back - Pain  . Right Leg - Pain  . Right Knee - Pain   HPI:  Jorge Walker is a 55 y.o. male who comes in today at the request of Dr. Eduard Roux for planned Right L3-L4 Lumbar epidural steroid injection with fluoroscopic guidance.  The patient has failed conservative care including home exercise, medications, time and activity modification.  This injection will be diagnostic and hopefully therapeutic.  Please see requesting physician notes for further details and justification. MRI reviewed with images and spine model.  MRI reviewed in the note below. Symptoms in right L3 dermatome with dysesthesia. Consider L4 level.    ROS Otherwise per HPI.  Assessment & Plan: Visit Diagnoses:    ICD-10-CM   1. Lumbar radiculopathy  M54.16 XR C-ARM NO REPORT    Epidural Steroid injection    methylPREDNISolone acetate (DEPO-MEDROL) injection 80 mg    Plan: No additional findings.   Meds & Orders:  Meds ordered this encounter  Medications  . methylPREDNISolone acetate (DEPO-MEDROL) injection 80 mg    Orders Placed This Encounter  Procedures  . XR C-ARM NO REPORT  . Epidural Steroid injection    Follow-up: Return if symptoms worsen or fail to improve.   Procedures: No procedures performed  Lumbosacral Transforaminal Epidural Steroid Injection - Sub-Pedicular Approach with Fluoroscopic Guidance  Patient: Jorge Walker      Date of Birth: 08-18-1966 MRN: [id number] PCP: Carlena Hurl, PA-C      Visit Date: 11/29/2020   Universal Protocol:    Date/Time: 11/29/2020  Consent Given By: the patient  Position: PRONE  Additional Comments: Vital signs were monitored before and after the procedure. Patient was prepped and draped  in the usual sterile fashion. The correct patient, procedure, and site was verified.   Injection Procedure Details:   Procedure diagnoses: Lumbar radiculopathy [M54.16]    Meds Administered:  Meds ordered this encounter  Medications  . methylPREDNISolone acetate (DEPO-MEDROL) injection 80 mg    Laterality: Right  Location/Site:  L3-L4  Needle:5.0 in., 22 ga.  Short bevel or Quincke spinal needle  Needle Placement: Transforaminal  Findings:    -Comments: Excellent flow of contrast along the nerve, nerve root and into the epidural space.  Procedure Details: After squaring off the end-plates to get a true AP view, the C-arm was positioned so that an oblique view of the foramen as noted above was visualized. The target area is just inferior to the "nose of the scotty dog" or sub pedicular. The soft tissues overlying this structure were infiltrated with 2-3 ml. of 1% Lidocaine without Epinephrine.  The spinal needle was inserted toward the target using a "trajectory" view along the fluoroscope beam.  Under AP and lateral visualization, the needle was advanced so it did not puncture dura and was located close the 6 O'Clock position of the pedical in AP tracterory. Biplanar projections were used to confirm position. Aspiration was confirmed to be negative for CSF and/or blood. A 1-2 ml. volume of Isovue-250 was injected and flow of contrast was noted at each level. Radiographs were obtained for documentation purposes.   After attaining the desired flow of contrast documented above, a 0.5 to 1.0 ml test dose of 0.25% Marcaine was injected  into each respective transforaminal space.  The patient was observed for 90 seconds post injection.  After no sensory deficits were reported, and normal lower extremity motor function was noted,   the above injectate was administered so that equal amounts of the injectate were placed at each foramen (level) into the transforaminal epidural  space.   Additional Comments:  No complications occurred Dressing: 2 x 2 sterile gauze and Band-Aid    Post-procedure details: Patient was observed during the procedure. Post-procedure instructions were reviewed.  Patient left the clinic in stable condition.      Clinical History: MRI LUMBAR SPINE WITHOUT CONTRAST  TECHNIQUE: Multiplanar, multisequence MR imaging of the lumbar spine was performed. No intravenous contrast was administered.  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 Hypoplastic L5-S1 disc.  Alignment:  Trace retrolisthesis of L4 on L5.  Vertebrae: No fracture or suspicious osseous lesion. Right greater than left facet edema at L3-4. Degenerative endplate changes anteriorly at T11-12.  Conus medullaris and cauda equina: Conus extends to the L1 level. Conus and cauda equina appear normal.  Paraspinal and other soft tissues: Partially visualized 2.5 cm T2 hyperintense focus in the posterior right hepatic lobe, possibly a cyst or hemangioma but incompletely evaluated. Left pelvic kidney with a partially visualized 1.6 cm cyst.  Disc levels:  T11-12: Disc desiccation and anterior spondylosis. No disc herniation or stenosis.  T12-L1 and L1-2: Negative.  L2-3: Minimal disc bulging and mild facet hypertrophy result in minimal right and mild left neural foraminal stenosis without spinal stenosis.  L3-4: Disc bulging, a right foraminal and extraforaminal disc protrusion, and moderate facet hypertrophy result in mild spinal stenosis, mild right and moderate left lateral recess stenosis, and moderate to severe right greater than left neural foraminal stenosis with potential bilateral L3 nerve root impingement, particularly on the right secondary to the disc protrusion.  L4-5: Disc bulging and moderate facet hypertrophy result in mild spinal  stenosis, moderate to severe right greater than left lateral recess stenosis, and moderate left greater than right neural foraminal stenosis.  L5-S1: Transitional anatomy.  No stenosis.  IMPRESSION: 1. Transitional lumbosacral anatomy as above. 2. Right foraminal and extraforaminal disc protrusion at L3-4 with moderate to severe neural foraminal stenosis and L3 nerve root impingement. 3. Moderate to severe lateral recess and moderate neural foraminal stenosis at L4-5.   Electronically Signed   By: Logan Bores M.D.   On: 11/06/2020 08:26     Objective:  VS:  HT:    WT:   BMI:     BP:138/85  HR:(!) 58bpm  TEMP: ( )  RESP:  Physical Exam   Imaging: No results found.

## 2020-12-29 ENCOUNTER — Other Ambulatory Visit: Payer: Self-pay

## 2020-12-29 ENCOUNTER — Encounter: Payer: Self-pay | Admitting: Medical

## 2020-12-29 ENCOUNTER — Ambulatory Visit (INDEPENDENT_AMBULATORY_CARE_PROVIDER_SITE_OTHER): Payer: 59 | Admitting: Medical

## 2020-12-29 VITALS — BP 124/88 | HR 62 | Ht 69.0 in | Wt 173.4 lb

## 2020-12-29 DIAGNOSIS — D509 Iron deficiency anemia, unspecified: Secondary | ICD-10-CM

## 2020-12-29 NOTE — Patient Instructions (Signed)
calcium carbonate (CALCIUM 600) 600 MG TABS tablet  cholecalciferol (VITAMIN D3) 25 MCG (1000 UNIT) tablet  ferrous gluconate (FERGON) 324 MG tablet  naproxen (NAPROSYN) 500 MG tablet  tamsulosin (FLOMAX) 0.4 MG CAPS capsule  HYDROcodone-acetaminophen (NORCO) 5-325 MG tablet  methocarbamol (ROBAXIN) 500 MG tablet  predniSONE (STERAPRED UNI-PAK 21 TAB) 10 MG (21) TBPK tablet  traMADol (ULTRAM) 50 MG tablet

## 2020-12-29 NOTE — Progress Notes (Signed)
Subjective:  Jorge Walker is a 55 y.o. male who presents for Chief Complaint  Patient presents with  . Follow-up     Here for f/u on anemia.  At his physical visit in January his labs showed anemia and low iron.  He does not have his medications with him today so he is not 100% sure what he is taking.  He thinks he is taking the ferrous gluconate iron started in January but he is not sure.  He denies any bleeding or bruising.  No blood in the stool.   Been feeling fine in general other than the chronic back pain issues he is having.  He is seeing specialist for the back pain  Eat several times a day and eats a variety of fruits and vegetables and whole grains and meat.  He drinks approximately 6 alcoholic drinks per week mostly on the weekends  No other aggravating or relieving factors.    No other c/o.  The following portions of the patient's history were reviewed and updated as appropriate: allergies, current medications, past family history, past medical history, past social history, past surgical history and problem list.  ROS Otherwise as in subjective above  Objective: BP 124/88   Pulse 62   Ht 5\' 9"  (1.753 m)   Wt 173 lb 6.4 oz (78.7 kg)   SpO2 98%   BMI 25.61 kg/m   General appearance: alert, no distress, well developed, well nourished Neck: supple, no lymphadenopathy, no thyromegaly, no masses Abdomen: +bs, soft, non tender, non distended, no masses, no hepatomegaly, no splenomegaly Pulses: 2+ radial pulses, 2+ pedal pulses, normal cap refill Ext: no edema    Assessment: Encounter Diagnosis  Name Primary?  . Iron deficiency anemia, unspecified iron deficiency anemia type Yes     Plan: We discussed his abnormal labs from January.  I asked him to take the med list that is in the chart record and compared to his bottles at home so we can reconcile his medications.  I printed him a med list today  Updated labs today presumably after taking a few months  of iron therapy daily  Colonoscopy from 2017 reviewed showing mild diverticulosis without diverticulitis or polyps.  His recent stool card was negative   Jorge Walker was seen today for follow-up.  Diagnoses and all orders for this visit:  Iron deficiency anemia, unspecified iron deficiency anemia type -     Iron, TIBC and Ferritin Panel -     CBC -     Vitamin B12 -     Folate    Follow up: pending labs

## 2020-12-30 LAB — VITAMIN B12: Vitamin B-12: 362 pg/mL (ref 232–1245)

## 2020-12-30 LAB — IRON,TIBC AND FERRITIN PANEL
Ferritin: 97 ng/mL (ref 30–400)
Iron Saturation: 19 % (ref 15–55)
Iron: 57 ug/dL (ref 38–169)
Total Iron Binding Capacity: 294 ug/dL (ref 250–450)
UIBC: 237 ug/dL (ref 111–343)

## 2020-12-30 LAB — CBC
Hematocrit: 41.4 % (ref 37.5–51.0)
Hemoglobin: 13.4 g/dL (ref 13.0–17.7)
MCH: 27.8 pg (ref 26.6–33.0)
MCHC: 32.4 g/dL (ref 31.5–35.7)
MCV: 86 fL (ref 79–97)
Platelets: 191 10*3/uL (ref 150–450)
RBC: 4.82 x10E6/uL (ref 4.14–5.80)
RDW: 13.3 % (ref 11.6–15.4)
WBC: 5.9 10*3/uL (ref 3.4–10.8)

## 2020-12-30 LAB — FOLATE: Folate: 7.9 ng/mL (ref >3.0)

## 2021-01-01 ENCOUNTER — Other Ambulatory Visit: Payer: Self-pay | Admitting: Medical

## 2021-01-01 MED ORDER — FERROUS GLUCONATE 324 (38 FE) MG PO TABS: 324.0000 mg | ORAL_TABLET | Freq: Every day | ORAL | 0 refills | Status: DC

## 2021-01-03 ENCOUNTER — Other Ambulatory Visit: Payer: Self-pay

## 2021-01-03 ENCOUNTER — Encounter: Payer: 59 | Admitting: Physical Medicine and Rehabilitation

## 2021-02-23 ENCOUNTER — Other Ambulatory Visit: Payer: 59

## 2021-03-02 ENCOUNTER — Encounter: Payer: 59 | Attending: Physical Medicine and Rehabilitation | Admitting: Physical Medicine and Rehabilitation

## 2021-03-02 ENCOUNTER — Encounter: Payer: Self-pay | Admitting: Physical Medicine and Rehabilitation

## 2021-03-02 ENCOUNTER — Other Ambulatory Visit: Payer: Self-pay

## 2021-03-02 VITALS — BP 156/94 | HR 65 | Temp 98.5°F | Ht 69.0 in | Wt 172.0 lb

## 2021-03-02 DIAGNOSIS — G894 Chronic pain syndrome: Secondary | ICD-10-CM | POA: Insufficient documentation

## 2021-03-02 DIAGNOSIS — Z79899 Other long term (current) drug therapy: Secondary | ICD-10-CM | POA: Diagnosis present

## 2021-03-02 DIAGNOSIS — Z5181 Encounter for therapeutic drug level monitoring: Secondary | ICD-10-CM | POA: Diagnosis present

## 2021-03-02 DIAGNOSIS — M5416 Radiculopathy, lumbar region: Secondary | ICD-10-CM | POA: Diagnosis present

## 2021-03-02 DIAGNOSIS — G4700 Insomnia, unspecified: Secondary | ICD-10-CM | POA: Insufficient documentation

## 2021-03-02 MED ORDER — LIDOCAINE 5 % EX PTCH: 1.0000 | MEDICATED_PATCH | CUTANEOUS | 0 refills | Status: DC

## 2021-03-02 NOTE — Progress Notes (Signed)
Subjective:    Patient ID: Jorge Walker, male    DOB: 09/14/1965, 55 y.o.   MRN: [id number]  HPI Jorge Walker is a 55 year old man who presents for follow-up of chronic pain all over his body.  He had a ESI- it provided 1.5 months of relief. He found it very painful. He has been diagnosed with disc bulges.   Average pain is 10/10  He was given hydrocodone and it helped a little but relief was temporary. Before this he tried Tylenol and this doesn't help. He tried Tramadol and this doesn't help.   He stands all day at work and his pain makes this hard for him.   Pain shoots down right leg   Pain Inventory Average Pain 10 Pain Right Now 10 My pain is constant, sharp, dull, stabbing, tingling, and aching  In the last 24 hours, has pain interfered with the following? General activity 8 Relation with others 6 Enjoyment of life 7 What TIME of day is your pain at its worst? night Sleep (in general) Poor  Pain is worse with: walking, bending, sitting, inactivity, standing, and some activites Pain improves with: medication and injections Relief from Meds: 69  Family History  Problem Relation Age of Onset   Hypertension Mother    Diabetes Mother    Pneumonia Father        died of pneumonia   Cancer Neg Hx    Heart disease Neg Hx    Stroke Neg Hx    Colon cancer Neg Hx    Colon polyps Neg Hx    Esophageal cancer Neg Hx    Rectal cancer Neg Hx    Stomach cancer Neg Hx    Social History   Socioeconomic History   Marital status: Single    Spouse name: Not on file   Number of children: Not on file   Years of education: Not on file   Highest education level: Not on file  Occupational History   Not on file  Tobacco Use   Smoking status: Every Day    Packs/day: 0.50    Years: 17.00    Pack years: 8.50    Types: Cigarettes   Smokeless tobacco: Never  Vaping Use   Vaping Use: Never used  Substance and Sexual Activity   Alcohol use: Yes    Alcohol/week: 5.0  standard drinks    Types: 5 Shots of liquor per week    Comment: weekends    Drug use: No   Sexual activity: Not on file  Other Topics Concern   Not on file  Social History Narrative   Single, girlfriend, divorced, has 1 child, 52yo daughter wants to go to college, she lives with mother, exercise with walking at work, works in Scientist, 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   Repeat 10 years.  Dr. Scarlette Shorts  ELBOW SURGERY  2019   Past Surgical History:  Procedure Laterality Date   ARM WOUND REPAIR / CLOSURE     right upper posterior arm - teenager, s/p trauma   ARTERY REPAIR     right upper arm - teenager, s/p trauma to right upper arm   COLONOSCOPY  01/24/2016   Diverticulosis in left and right colon.   Repeat 10 years.  Dr. Scarlette Shorts   ELBOW SURGERY  2019   Past Medical History:  Diagnosis Date   Allergy    Arthritis    finger   BPH (benign prostatic hypertrophy) 2020   BPH with elevated PSA   Epididymal cyst    Tobacco use disorder    BP (!) 156/94   Pulse 65   Temp 98.5 F (36.9 C)   Ht 5\' 9"  (1.753 m)   Wt 172 lb (78 kg)   SpO2 98%   BMI 25.40 kg/m   Opioid Risk Score:   Fall Risk Score:  `1  Depression screen PHQ 2/9  Depression screen [facility] 2/9 03/02/2021 09/05/2020 02/03/2019 11/28/2017  Decreased Interest 0 0 0 0  Down, Depressed, Hopeless 0 0 0 0  PHQ - 2 Score 0 0 0 0  Altered sleeping 3 - - -  Tired, decreased energy 0 - - -   Change in appetite 0 - - -  Feeling bad or failure about yourself  0 - - -  Trouble concentrating 0 - - -  Moving slowly or fidgety/restless 0 - - -  Suicidal thoughts 0 - - -  PHQ-9 Score 3 - - -    Review of Systems  Musculoskeletal:  Positive for arthralgias and back pain.       Pain in both arms, right leg, right hip      Objective:   Physical Exam Gen: no distress, normal appearing HEENT: oral mucosa pink and moist, NCAT Cardio: Reg rate Chest: normal effort, normal rate of breathing Abd: soft, non-distended Ext: no edema Psych: pleasant, normal affect Skin: intact Neuro: Alert  Musculoskeletal: Pain limited in extension, flexion, ambulating, toe walking, +slump test on right.      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  -Discussed current symptoms of pain and history of pain.  -Discussed benefits of exercise in reducing pain. -urine sample obtained today -pain contract obtained today -plan for hydrocodone 10mg  BID PRN if urine sample negative.  -heating pad -lidocaine patch -Discussed following foods that may reduce pain: 1) Ginger (especially studied for arthritis)- reduce leukotriene production to decrease inflammation 2) Blueberries- high in phytonutrients that decrease inflammation 3) Salmon- marine omega-3s reduce joint swelling and pain 4) Pumpkin seeds- reduce inflammation 5) dark chocolate- reduces inflammation 6) turmeric- reduces inflammation 7) tart cherries - reduce pain and stiffness 8) extra virgin olive oil - its compound olecanthal helps to block prostaglandins  9) chili peppers- can be eaten or applied topically via capsaicin 10) mint- helpful for headache, muscle aches, joint pain, and itching 11) garlic- reduces inflammation  Link to further information on diet for chronic pain: [email or web address].[email or web address]    Insomnia: -heating pad for pain.  -plan for hydrocodone if urine sample is negative.  -discussed gabapentin but he defers at this time

## 2021-03-02 NOTE — Addendum Note (Signed)
Addended by: Franchot Gallo on: 03/02/2021 10:37 AM   Modules accepted: Orders

## 2021-03-02 NOTE — Patient Instructions (Signed)
1) Ginger (especially studied for arthritis)- reduce leukotriene production to decrease inflammation 2) Blueberries- high in phytonutrients that decrease inflammation 3) Salmon- marine omega-3s reduce joint swelling and pain 4) Pumpkin seeds- reduce inflammation 5) dark chocolate- reduces inflammation 6) turmeric- reduces inflammation 7) tart cherries - reduce pain and stiffness 8) extra virgin olive oil - its compound olecanthal helps to block prostaglandins  9) chili peppers- can be eaten or applied topically via capsaicin 10) mint- helpful for headache, muscle aches, joint pain, and itching 11) garlic- reduces inflammation  Link to further information on diet for chronic pain: [email or web address]  

## 2021-03-08 ENCOUNTER — Telehealth: Payer: Self-pay | Admitting: Physical Medicine and Rehabilitation

## 2021-03-08 LAB — TOXASSURE SELECT,+ANTIDEPR,UR

## 2021-03-08 NOTE — Telephone Encounter (Signed)
Patient notified

## 2021-03-08 NOTE — Telephone Encounter (Signed)
Patient is calling to get his medication filled.  Would like to know if his lab results are back about drug screen.  Please call patient.

## 2021-03-09 ENCOUNTER — Telehealth: Payer: Self-pay

## 2021-03-09 NOTE — Telephone Encounter (Signed)
Patient has been informed. He stated he will come in on next Tuesday 03/13/2021. Patient advised to call back so order can be placed.

## 2021-03-09 NOTE — Telephone Encounter (Signed)
Jorge Walker called back for the results of his recent labs.   Call back phone number [phone]. (Patient has been informed Dr. Ranell Patrick is out of the office until Tuesday).

## 2021-03-16 ENCOUNTER — Telehealth: Payer: Self-pay | Admitting: *Deleted

## 2021-03-16 DIAGNOSIS — Z79899 Other long term (current) drug therapy: Secondary | ICD-10-CM

## 2021-03-16 DIAGNOSIS — G894 Chronic pain syndrome: Secondary | ICD-10-CM

## 2021-03-16 DIAGNOSIS — Z5181 Encounter for therapeutic drug level monitoring: Secondary | ICD-10-CM

## 2021-03-16 NOTE — Telephone Encounter (Signed)
Urine drug screen is positive for alcohol and oxycodone. Per the PMP, he has not been prescribed oxycodone in the past 5 years. I spoke with him and he admits that he was in pain and nothing was working and he took someone's oxycodone. This was his initial drug screen prior to being under contract, and he was not aware of being unable to take someone else's medication.

## 2021-03-19 NOTE — Telephone Encounter (Signed)
Left message with Mr Jorge Walker that the order for repeat UDS has been placed and he is to come by our office this week to do the urine sample. Forms at front desk.

## 2021-03-19 NOTE — Addendum Note (Signed)
Addended by: Caro Hight on: 03/19/2021 02:29 PM   Modules accepted: Orders

## 2021-03-23 LAB — TOXASSURE SELECT,+ANTIDEPR,UR

## 2021-03-27 ENCOUNTER — Telehealth: Payer: Self-pay | Admitting: *Deleted

## 2021-03-27 NOTE — Telephone Encounter (Addendum)
Urine drug screen for this encounter is consistent for no prescribed or unprescribed medication, or alcohol.

## 2021-03-30 ENCOUNTER — Encounter: Payer: Self-pay | Admitting: Registered Nurse

## 2021-03-30 ENCOUNTER — Encounter: Payer: 59 | Attending: Registered Nurse | Admitting: Registered Nurse

## 2021-03-30 ENCOUNTER — Other Ambulatory Visit: Payer: Self-pay

## 2021-03-30 VITALS — BP 147/90 | HR 61 | Temp 98.9°F | Ht 69.0 in | Wt 171.4 lb

## 2021-03-30 DIAGNOSIS — M2550 Pain in unspecified joint: Secondary | ICD-10-CM | POA: Diagnosis present

## 2021-03-30 DIAGNOSIS — G894 Chronic pain syndrome: Secondary | ICD-10-CM | POA: Insufficient documentation

## 2021-03-30 DIAGNOSIS — G629 Polyneuropathy, unspecified: Secondary | ICD-10-CM | POA: Diagnosis present

## 2021-03-30 DIAGNOSIS — M5416 Radiculopathy, lumbar region: Secondary | ICD-10-CM | POA: Diagnosis present

## 2021-03-30 DIAGNOSIS — Z5181 Encounter for therapeutic drug level monitoring: Secondary | ICD-10-CM | POA: Diagnosis present

## 2021-03-30 DIAGNOSIS — Z79899 Other long term (current) drug therapy: Secondary | ICD-10-CM | POA: Insufficient documentation

## 2021-03-30 DIAGNOSIS — M778 Other enthesopathies, not elsewhere classified: Secondary | ICD-10-CM | POA: Diagnosis present

## 2021-03-30 MED ORDER — HYDROCODONE-ACETAMINOPHEN 10-325 MG PO TABS: 1.0000 | ORAL_TABLET | Freq: Two times a day (BID) | ORAL | 0 refills | Status: DC | PRN

## 2021-03-30 NOTE — Progress Notes (Signed)
Subjective:    Patient ID: Jorge Walker, male    DOB: May 01, 1966, 55 y.o.   MRN: [id number]  HPI: Jorge Walker is a 55 y.o. male who returns for follow up appointment for chronic pain. He states his pain is located in his right shoulder, lower back pain radiating into his right lower extremity. Also reports generalized joint pain and right hand pain with tingling and burning. He rates his pain 7. His current exercise regime is walking and performing stretching exercises.  Dr Ranell Patrick note was reviewed: His UDS is consistent, he will be prescribed hydrocodone. He verbalizes understanding.      Pain Inventory Average Pain 7 Pain Right Now 7 My pain is intermittent, constant, sharp, dull, stabbing, tingling, and aching  In the last 24 hours, has pain interfered with the following? General activity 7 Relation with others 8 Enjoyment of life 8 What TIME of day is your pain at its worst? morning  and night Sleep (in general) Poor  Pain is worse with: walking, bending, sitting, and standing Pain improves with: medication and injections Relief from Meds: 68  Family History  Problem Relation Age of Onset   Hypertension Mother    Diabetes Mother    Pneumonia Father        died of pneumonia   Cancer Neg Hx    Heart disease Neg Hx    Stroke Neg Hx    Colon cancer Neg Hx    Colon polyps Neg Hx    Esophageal cancer Neg Hx    Rectal cancer Neg Hx    Stomach cancer Neg Hx    Social History   Socioeconomic History   Marital status: Single    Spouse name: Not on file   Number of children: Not on file   Years of education: Not on file   Highest education level: Not on file  Occupational History   Not on file  Tobacco Use   Smoking status: Every Day    Packs/day: 0.50    Years: 17.00    Pack years: 8.50    Types: Cigarettes   Smokeless tobacco: Never  Vaping Use   Vaping Use: Never used  Substance and Sexual Activity   Alcohol use: Yes    Alcohol/week: 5.0  standard drinks    Types: 5 Shots of liquor per week    Comment: weekends    Drug use: No   Sexual activity: Not on file  Other Topics Concern   Not on file  Social History Narrative   Single, girlfriend, divorced, has 1 child, 44yo daughter wants to go to college, she lives with mother, exercise with walking at work, works in Scientist, 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   Repeat 10 years.  Dr. Scarlette Shorts   ELBOW SURGERY  2019   Past Surgical History:  Procedure Laterality Date  ARM WOUND REPAIR / CLOSURE     right upper posterior arm - teenager, s/p trauma   ARTERY REPAIR     right upper arm - teenager, s/p trauma to right upper arm   COLONOSCOPY  01/24/2016   Diverticulosis in left and right colon.   Repeat 10 years.  Dr. Scarlette Shorts   ELBOW SURGERY  2019   Past Medical History:  Diagnosis Date   Allergy    Arthritis    finger   BPH (benign prostatic hypertrophy) 2020   BPH with elevated PSA   Epididymal cyst    Tobacco use disorder    Temp 98.9 F (37.2 C)   Ht '5\' 9"'$  (1.753 m)   Wt 171 lb 6.4 oz (77.7 kg)   BMI 25.31 kg/m   Opioid Risk Score:   Fall Risk Score:  `1  Depression screen PHQ 2/9  Depression screen [facility] 2/9 03/02/2021 09/05/2020 02/03/2019 11/28/2017  Decreased Interest 0 0 0 0  Down, Depressed, Hopeless 0 0 0 0  PHQ - 2 Score 0 0 0 0  Altered sleeping 3 - - -  Tired, decreased energy 0 - - -  Change in appetite 0 - - -   Feeling bad or failure about yourself  0 - - -  Trouble concentrating 0 - - -  Moving slowly or fidgety/restless 0 - - -  Suicidal thoughts 0 - - -  PHQ-9 Score 3 - - -    Review of Systems  Musculoskeletal:  Positive for back pain.       Right knee, right arm, right shoulder  All other systems reviewed and are negative.     Objective:   Physical Exam Vitals and nursing note reviewed.  Constitutional:      Appearance: Normal appearance.  Cardiovascular:     Rate and Rhythm: Normal rate and regular rhythm.     Pulses: Normal pulses.     Heart sounds: Normal heart sounds.  Pulmonary:     Effort: Pulmonary effort is normal.     Breath sounds: Normal breath sounds.  Musculoskeletal:     Cervical back: Normal range of motion and neck supple.     Comments: Normal Muscle Bulk and Muscle Testing Reveals:  Upper Extremities: Right : Decreased ROM 90 Degrees and Muscle Strength 5/5 Left Upper Extremity: Full ROM and Muscle Strength 5/5 Lumbar Paraspinal Tenderness: L-4-L-5 Lower Extremities: Full ROM and Muscle Strength 5/5 Arises from Table with ease Narrow Based  Gait     Skin:    General: Skin is warm and dry.  Neurological:     Mental Status: He is alert and oriented to person, place, and time.  Psychiatric:        Mood and Affect: Mood normal.        Behavior: Behavior normal.         Assessment & Plan:  Right Lumbar Radiculitis: Continue HEP as Tolerated. Continue current medication regimen. Continue to monitor.  Right Shoulder Tendonitis: Continue HEP as Tolerated. Continue to monitor.  Polyarthralgia: Continue HEP as Tolerated: Continue to Alternate Heat and Ice Therapy. Continue to Monitor.  Polyneuropathy: Continue Current medication regimen. Continue to monitor.  Chronic Pain Syndrome: RX: Hydrocodone 10/325 mg one tablet twice a day as needed for pain #60. We will continue the opioid monitoring program, this consists of regular clinic visits, examinations,  urine drug screen, pill counts as well as use of [place] Controlled Substance Reporting system. A 12 month History has  been reviewed on the [place] Controlled Substance Reporting System on 03/30/2021.   F/U in 1 month

## 2021-04-04 ENCOUNTER — Telehealth: Payer: Self-pay

## 2021-04-04 MED ORDER — HYDROCODONE-ACETAMINOPHEN 10-325 MG PO TABS: 1.0000 | ORAL_TABLET | Freq: Two times a day (BID) | ORAL | 0 refills | Status: DC | PRN

## 2021-04-04 NOTE — Telephone Encounter (Signed)
Please send to [facility] on [address]. It is in stock for now.

## 2021-04-04 NOTE — Addendum Note (Signed)
Addended by: Bayard Hugger on: 04/04/2021 02:34 PM   Modules accepted: Orders

## 2021-04-04 NOTE — Telephone Encounter (Signed)
[facility] on [address] is out of Rx Hydrocodone acetaminophen 10-325 MG.   I have call to cancel the Rx. Also he will need  the 7 day supply first  then the 30 day supply.I will put the new the drug store to the chart. When I find a location that has the medication.

## 2021-04-04 NOTE — Telephone Encounter (Signed)
PMP was Reviewed:  Hydrocodone e-scribed. Robbin will call Mr. Merker

## 2021-04-10 ENCOUNTER — Other Ambulatory Visit: Payer: Self-pay

## 2021-04-10 MED ORDER — HYDROCODONE-ACETAMINOPHEN 10-325 MG PO TABS: 1.0000 | ORAL_TABLET | Freq: Two times a day (BID) | ORAL | 0 refills | Status: DC | PRN

## 2021-04-10 NOTE — Telephone Encounter (Signed)
Jorge Walker is out of the office this week:   Jorge Walker received the 7 day supply of  Hydrocodone 10/325 MG . If granted can you send in the rest of his pain medication for the 30 day supply? Please send to [facility] on [address].   Call back phone [phone].   Thank you

## 2021-04-20 ENCOUNTER — Ambulatory Visit: Payer: 59 | Admitting: Physical Medicine and Rehabilitation

## 2021-04-27 ENCOUNTER — Encounter: Payer: 59 | Attending: Registered Nurse | Admitting: Registered Nurse

## 2021-04-27 DIAGNOSIS — M2550 Pain in unspecified joint: Secondary | ICD-10-CM | POA: Insufficient documentation

## 2021-04-27 DIAGNOSIS — G894 Chronic pain syndrome: Secondary | ICD-10-CM | POA: Insufficient documentation

## 2021-04-27 DIAGNOSIS — G629 Polyneuropathy, unspecified: Secondary | ICD-10-CM | POA: Insufficient documentation

## 2021-04-27 DIAGNOSIS — M778 Other enthesopathies, not elsewhere classified: Secondary | ICD-10-CM | POA: Insufficient documentation

## 2021-04-27 DIAGNOSIS — M5416 Radiculopathy, lumbar region: Secondary | ICD-10-CM | POA: Insufficient documentation

## 2021-04-27 DIAGNOSIS — Z5181 Encounter for therapeutic drug level monitoring: Secondary | ICD-10-CM | POA: Insufficient documentation

## 2021-04-27 DIAGNOSIS — Z79899 Other long term (current) drug therapy: Secondary | ICD-10-CM | POA: Insufficient documentation

## 2021-05-10 ENCOUNTER — Other Ambulatory Visit: Payer: Self-pay

## 2021-05-10 NOTE — Telephone Encounter (Addendum)
Jorge Walker has requested a refill of Hydrocodone 10-325 mg. Last appointment was a no-show. The one before was cancelled. Please advise. Last fill on 04/10/21 in PMP.

## 2021-05-11 NOTE — Telephone Encounter (Signed)
Per Dr. Ranell Patrick patient need to come in to see [name] on the next available appointment for future refills. He will need to follow the contract guidelines.   Not able to leave a voicemail on his voicemail.

## 2021-05-11 NOTE — Telephone Encounter (Signed)
Patient advised per Dr. Ranell Patrick message.

## 2021-05-21 ENCOUNTER — Telehealth: Payer: Self-pay | Admitting: Physical Medicine and Rehabilitation

## 2021-05-21 NOTE — Telephone Encounter (Signed)
Patient has 4 pills left and is scheduled with [name] for 10/7

## 2021-05-22 NOTE — Telephone Encounter (Signed)
Notified that we do not have any earlier appts and because of his no shows we cannot prescribe anything until he is seen. He said "ok".

## 2021-05-25 ENCOUNTER — Encounter: Payer: 59 | Admitting: Physical Medicine and Rehabilitation

## 2021-06-01 ENCOUNTER — Other Ambulatory Visit: Payer: Self-pay | Admitting: Registered Nurse

## 2021-06-01 ENCOUNTER — Encounter: Payer: 59 | Attending: Physical Medicine and Rehabilitation | Admitting: Registered Nurse

## 2021-06-01 ENCOUNTER — Encounter: Payer: Self-pay | Admitting: Registered Nurse

## 2021-06-01 ENCOUNTER — Other Ambulatory Visit: Payer: Self-pay

## 2021-06-01 VITALS — BP 159/94 | HR 62 | Temp 97.7°F | Ht 69.0 in | Wt 168.2 lb

## 2021-06-01 DIAGNOSIS — Z79899 Other long term (current) drug therapy: Secondary | ICD-10-CM | POA: Insufficient documentation

## 2021-06-01 DIAGNOSIS — Z5181 Encounter for therapeutic drug level monitoring: Secondary | ICD-10-CM | POA: Diagnosis not present

## 2021-06-01 DIAGNOSIS — M778 Other enthesopathies, not elsewhere classified: Secondary | ICD-10-CM | POA: Diagnosis present

## 2021-06-01 DIAGNOSIS — G894 Chronic pain syndrome: Secondary | ICD-10-CM | POA: Insufficient documentation

## 2021-06-01 DIAGNOSIS — G629 Polyneuropathy, unspecified: Secondary | ICD-10-CM | POA: Insufficient documentation

## 2021-06-01 DIAGNOSIS — M5416 Radiculopathy, lumbar region: Secondary | ICD-10-CM | POA: Insufficient documentation

## 2021-06-01 MED ORDER — HYDROCODONE-ACETAMINOPHEN 10-325 MG PO TABS: 1.0000 | ORAL_TABLET | Freq: Two times a day (BID) | ORAL | 0 refills | Status: DC | PRN
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
## 2021-06-08 LAB — TOXASSURE SELECT,+ANTIDEPR,UR

## 2021-06-11 ENCOUNTER — Telehealth: Payer: Self-pay | Admitting: *Deleted

## 2021-06-11 NOTE — Telephone Encounter (Signed)
Urine drug screen is positive for hydrocodone and its metabolites but is also positive for alcohol. This is his second positive for alcohol. He was warned about this on 03/09/21.

## 2021-06-11 NOTE — Telephone Encounter (Signed)
FINAL warning letter sent on combining alcohol with narcotic medication.

## 2021-06-14 ENCOUNTER — Other Ambulatory Visit: Payer: Self-pay

## 2021-06-14 ENCOUNTER — Ambulatory Visit (INDEPENDENT_AMBULATORY_CARE_PROVIDER_SITE_OTHER): Payer: 59 | Admitting: Medical

## 2021-06-14 VITALS — BP 136/84 | HR 67 | Wt 169.2 lb

## 2021-06-14 DIAGNOSIS — M545 Low back pain, unspecified: Secondary | ICD-10-CM

## 2021-06-14 DIAGNOSIS — M79601 Pain in right arm: Secondary | ICD-10-CM | POA: Diagnosis not present

## 2021-06-14 DIAGNOSIS — N400 Enlarged prostate without lower urinary tract symptoms: Secondary | ICD-10-CM | POA: Diagnosis not present

## 2021-06-14 DIAGNOSIS — R972 Elevated prostate specific antigen [PSA]: Secondary | ICD-10-CM

## 2021-06-14 MED ORDER — TAMSULOSIN HCL 0.4 MG PO CAPS: 0.8000 mg | ORAL_CAPSULE | Freq: Every day | ORAL | 0 refills | Status: DC
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
## 2021-06-15 ENCOUNTER — Telehealth: Payer: Self-pay | Admitting: Medical

## 2021-06-15 DIAGNOSIS — M79601 Pain in right arm: Secondary | ICD-10-CM

## 2021-06-15 DIAGNOSIS — M545 Low back pain, unspecified: Secondary | ICD-10-CM
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
## 2021-06-15 NOTE — Telephone Encounter (Signed)
I have put referral in for PT. Please send to [facility]

## 2021-06-25 ENCOUNTER — Telehealth: Payer: Self-pay | Admitting: *Deleted

## 2021-06-25 NOTE — Telephone Encounter (Signed)
Mr Kerwin called about his appt on Friday. Please call.

## 2021-06-28 NOTE — Telephone Encounter (Signed)
done

## 2021-06-29 ENCOUNTER — Other Ambulatory Visit: Payer: Self-pay

## 2021-06-29 ENCOUNTER — Encounter: Payer: 59 | Attending: Registered Nurse | Admitting: Registered Nurse

## 2021-06-29 ENCOUNTER — Encounter: Payer: Self-pay | Admitting: Registered Nurse

## 2021-06-29 VITALS — BP 170/84 | HR 62 | Ht 69.0 in | Wt 168.6 lb

## 2021-06-29 DIAGNOSIS — Z5181 Encounter for therapeutic drug level monitoring: Secondary | ICD-10-CM | POA: Diagnosis not present

## 2021-06-29 DIAGNOSIS — M5416 Radiculopathy, lumbar region: Secondary | ICD-10-CM | POA: Insufficient documentation

## 2021-06-29 DIAGNOSIS — Z79899 Other long term (current) drug therapy: Secondary | ICD-10-CM | POA: Insufficient documentation

## 2021-06-29 DIAGNOSIS — I10 Essential (primary) hypertension: Secondary | ICD-10-CM | POA: Insufficient documentation

## 2021-06-29 DIAGNOSIS — G894 Chronic pain syndrome: Secondary | ICD-10-CM | POA: Diagnosis not present

## 2021-06-29 DIAGNOSIS — M2550 Pain in unspecified joint: Secondary | ICD-10-CM | POA: Diagnosis not present

## 2021-06-29 MED ORDER — HYDROCODONE-ACETAMINOPHEN 10-325 MG PO TABS: 1.0000 | ORAL_TABLET | Freq: Two times a day (BID) | ORAL | 0 refills | Status: DC | PRN
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
## 2021-06-29 NOTE — Patient Instructions (Signed)
My Chart Phone Number   [phone] 

## 2021-07-06 ENCOUNTER — Ambulatory Visit: Payer: 59 | Admitting: Physical Medicine and Rehabilitation

## 2021-08-03 ENCOUNTER — Other Ambulatory Visit: Payer: 59

## 2021-08-10 ENCOUNTER — Encounter: Payer: Self-pay | Admitting: Registered Nurse

## 2021-08-10 ENCOUNTER — Encounter: Payer: 59 | Attending: Registered Nurse | Admitting: Registered Nurse

## 2021-08-10 ENCOUNTER — Other Ambulatory Visit: Payer: Self-pay

## 2021-08-10 VITALS — BP 150/99 | HR 61 | Temp 99.4°F | Ht 69.0 in | Wt 170.2 lb

## 2021-08-10 DIAGNOSIS — G894 Chronic pain syndrome: Secondary | ICD-10-CM | POA: Diagnosis not present

## 2021-08-10 DIAGNOSIS — M545 Low back pain, unspecified: Secondary | ICD-10-CM | POA: Insufficient documentation

## 2021-08-10 DIAGNOSIS — Z5181 Encounter for therapeutic drug level monitoring: Secondary | ICD-10-CM | POA: Insufficient documentation

## 2021-08-10 DIAGNOSIS — G8929 Other chronic pain: Secondary | ICD-10-CM | POA: Insufficient documentation

## 2021-08-10 DIAGNOSIS — Z79899 Other long term (current) drug therapy: Secondary | ICD-10-CM | POA: Insufficient documentation

## 2021-08-10 DIAGNOSIS — G629 Polyneuropathy, unspecified: Secondary | ICD-10-CM | POA: Insufficient documentation

## 2021-08-10 DIAGNOSIS — M546 Pain in thoracic spine: Secondary | ICD-10-CM | POA: Insufficient documentation

## 2021-08-10 NOTE — Progress Notes (Signed)
Subjective:    Patient ID: Jorge Walker, male    DOB: Apr 09, 1966, 55 y.o.   MRN: [id number]  HPI: Jorge Walker is a 55 y.o. male who returns for follow up appointment for chronic pain and medication refill. He states his  pain is located in his mid - lower back pain mainly right side. Also reports tingling and burning in bilateral upper extremities, He rates his pain 8. His current exercise regime is walking and performing stretching exercises.  Mr. Llanas Morphine equivalent is 20.00 MME.   Last UDS was Performed on 06/01/2021, see note for details.     Pain Inventory Average Pain 8 Pain Right Now 8 My pain is intermittent, sharp, burning, dull, stabbing, tingling, and aching  In the last 24 hours, has pain interfered with the following? General activity 8 Relation with others 8 Enjoyment of life 8 What TIME of day is your pain at its worst? morning  and evening Sleep (in general) Poor  Pain is worse with: bending, sitting, standing, and some activites Pain improves with: rest and medication Relief from Meds: 7  Family History  Problem Relation Age of Onset   Hypertension Mother    Diabetes Mother    Pneumonia Father        died of pneumonia   Cancer Neg Hx    Heart disease Neg Hx    Stroke Neg Hx    Colon cancer Neg Hx    Colon polyps Neg Hx    Esophageal cancer Neg Hx    Rectal cancer Neg Hx    Stomach cancer Neg Hx    Social History   Socioeconomic History   Marital status: Single    Spouse name: Not on file   Number of children: Not on file   Years of education: Not on file   Highest education level: Not on file  Occupational History   Not on file  Tobacco Use   Smoking status: Every Day    Packs/day: 0.50    Years: 17.00    Pack years: 8.50    Types: Cigarettes   Smokeless tobacco: Never  Vaping Use   Vaping Use: Never used  Substance and Sexual Activity   Alcohol use: Yes    Alcohol/week: 5.0 standard drinks    Types: 5 Shots of  liquor per week    Comment: weekends    Drug use: No   Sexual activity: Not on file  Other Topics Concern   Not on file  Social History Narrative   Single, girlfriend, divorced, has 1 child, 3yo daughter wants to go to college, she lives with mother, exercise with walking at work, works in Scientist, 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   Repeat 10 years.  Dr. Scarlette Shorts   ELBOW SURGERY  2019   Past Surgical History:  Procedure Laterality Date  ARM WOUND REPAIR / CLOSURE     right upper posterior arm - teenager, s/p trauma   ARTERY REPAIR     right upper arm - teenager, s/p trauma to right upper arm   COLONOSCOPY  01/24/2016   Diverticulosis in left and right colon.   Repeat 10 years.  Dr. Scarlette Shorts   ELBOW SURGERY  2019   Past Medical History:  Diagnosis Date   Allergy    Arthritis    finger   BPH (benign prostatic hypertrophy) 2020   BPH with elevated PSA   Epididymal cyst    Tobacco use disorder    Ht 5\' 9"  (1.753 m)    Wt 170 lb 3.2 oz (77.2 kg)    BMI 25.13 kg/m   Opioid Risk Score:   Fall Risk Score:  `1  Depression screen PHQ 2/9  Depression screen [facility] 2/9 08/10/2021 06/29/2021 06/14/2021 06/01/2021 03/30/2021 03/02/2021 09/05/2020  Decreased Interest 0 0 0 0 0 0 0  Down, Depressed, Hopeless 0 0 0 0 0 0 0  PHQ - 2 Score 0 0 0 0 0 0 0  Altered sleeping - - - - - 3 -  Tired, decreased energy - - - - - 0 -  Change in appetite - - - - - 0 -   Feeling bad or failure about yourself  - - - - - 0 -  Trouble concentrating - - - - - 0 -  Moving slowly or fidgety/restless - - - - - 0 -  Suicidal thoughts - - - - - 0 -  PHQ-9 Score - - - - - 3 -     Review of Systems  Constitutional: Negative.   HENT: Negative.    Eyes: Negative.   Respiratory: Negative.    Cardiovascular: Negative.   Gastrointestinal: Negative.   Endocrine: Negative.   Genitourinary: Negative.   Musculoskeletal:  Positive for back pain.  Skin: Negative.   Allergic/Immunologic: Negative.   Neurological:  Positive for numbness.  Hematological: Negative.   Psychiatric/Behavioral:  Positive for sleep disturbance.       Objective:   Physical Exam Vitals and nursing note reviewed.  Constitutional:      Appearance: Normal appearance.  Cardiovascular:     Rate and Rhythm: Normal rate and regular rhythm.     Pulses: Normal pulses.     Heart sounds: Normal heart sounds.  Pulmonary:     Effort: Pulmonary effort is normal.     Breath sounds: Normal breath sounds.  Musculoskeletal:     Cervical back: Normal range of motion and neck supple.     Comments: Normal Muscle Bulk and Muscle Testing Reveals:  Upper Extremities: Full ROM and Muscle Strength 5/5 Thoracic Paraspinal Tenderness: T-6- T-7 Mainly Right side Lumbar Paraspinal Tenderness: L-3-L-5 Lower Extremities: Full ROM and Muscle Strength 5/5 Arises from chair with ease Narrow Based  Gait     Skin:    General: Skin is warm and dry.  Neurological:     Mental Status: He is alert and oriented to person, place, and time.  Psychiatric:        Mood and Affect: Mood normal.        Behavior: Behavior normal.         Assessment & Plan:  1. Lumbar Radiculitis: No complaints today. Continue HEP as Tolerated. Continue current medication regimen. Continue to monitor. 08/09/2021 Right Shoulder Tendonitis: No complaints today. Continue HEP as Tolerated. Continue to monitor. 08/09/2021 Polyarthralgia:  Continue HEP  as Tolerated: Continue to Alternate Heat and Ice Therapy. Continue to Monitor. 08/09/2021 Polyneuropathy: Continue Current medication regimen. Continue to monitor. 08/09/2021 Chronic Pain Syndrome: Refilled: Hydrocodone 10/325 mg one tablet twice a day as needed for pain #60. We will continue the opioid monitoring program, this consists of regular clinic visits, examinations, urine drug screen, pill counts as well as use of [place] Controlled Substance Reporting system. A 12 month History has been reviewed on the [place] Controlled Substance Reporting System on 08/09/2021.    F/U in 1 month

## 2021-09-10 ENCOUNTER — Encounter: Payer: 59 | Attending: Registered Nurse | Admitting: Registered Nurse

## 2021-09-10 ENCOUNTER — Other Ambulatory Visit: Payer: Self-pay

## 2021-09-10 ENCOUNTER — Encounter: Payer: Self-pay | Admitting: Registered Nurse

## 2021-09-10 VITALS — BP 164/94 | HR 66 | Temp 99.0°F | Ht 69.0 in | Wt 171.2 lb

## 2021-09-10 DIAGNOSIS — G629 Polyneuropathy, unspecified: Secondary | ICD-10-CM | POA: Insufficient documentation

## 2021-09-10 DIAGNOSIS — M778 Other enthesopathies, not elsewhere classified: Secondary | ICD-10-CM | POA: Diagnosis present

## 2021-09-10 DIAGNOSIS — M546 Pain in thoracic spine: Secondary | ICD-10-CM | POA: Diagnosis present

## 2021-09-10 DIAGNOSIS — M25561 Pain in right knee: Secondary | ICD-10-CM | POA: Insufficient documentation

## 2021-09-10 DIAGNOSIS — G894 Chronic pain syndrome: Secondary | ICD-10-CM | POA: Insufficient documentation

## 2021-09-10 DIAGNOSIS — G8929 Other chronic pain: Secondary | ICD-10-CM | POA: Insufficient documentation

## 2021-09-10 DIAGNOSIS — Z79899 Other long term (current) drug therapy: Secondary | ICD-10-CM | POA: Insufficient documentation

## 2021-09-10 DIAGNOSIS — M2550 Pain in unspecified joint: Secondary | ICD-10-CM | POA: Insufficient documentation

## 2021-09-10 DIAGNOSIS — M545 Low back pain, unspecified: Secondary | ICD-10-CM | POA: Diagnosis not present

## 2021-09-10 DIAGNOSIS — Z5181 Encounter for therapeutic drug level monitoring: Secondary | ICD-10-CM | POA: Diagnosis not present

## 2021-09-10 MED ORDER — HYDROCODONE-ACETAMINOPHEN 10-325 MG PO TABS: 1.0000 | ORAL_TABLET | Freq: Two times a day (BID) | ORAL | 0 refills | Status: DC | PRN

## 2021-09-10 NOTE — Progress Notes (Signed)
Subjective:    Patient ID: Jorge Walker, male    DOB: 1966-08-19, 56 y.o.   MRN: [id number]  HPI: Jorge Walker is a 56 y.o. male who returns for follow up appointment for chronic pain and medication refill. He states his pain is located in his right shoulder, mid- back mainly right side nd lower back mainly right side. Also reports  neuropathic pain in his right upper and right lower extremity and right knee pain. He rates her pain 8. His current exercise regime is walking and performing stretching exercises.  Jorge Walker Morphine equivalent is 20.00 MME. Oral Swab was Performed today.      Pain Inventory Average Pain 8 Pain Right Now 8 My pain is sharp, burning, stabbing, tingling, and aching  In the last 24 hours, has pain interfered with the following? General activity 8 Relation with others 8 Enjoyment of life 8 What TIME of day is your pain at its worst? morning , evening, and night Sleep (in general) Poor  Pain is worse with: bending, sitting, inactivity, standing, and some activites Pain improves with: medication Relief from Meds: 75  Family History  Problem Relation Age of Onset   Hypertension Mother    Diabetes Mother    Pneumonia Father        died of pneumonia   Cancer Neg Hx    Heart disease Neg Hx    Stroke Neg Hx    Colon cancer Neg Hx    Colon polyps Neg Hx    Esophageal cancer Neg Hx    Rectal cancer Neg Hx    Stomach cancer Neg Hx    Social History   Socioeconomic History   Marital status: Single    Spouse name: Not on file   Number of children: Not on file   Years of education: Not on file   Highest education level: Not on file  Occupational History   Not on file  Tobacco Use   Smoking status: Every Day    Packs/day: 0.50    Years: 17.00    Pack years: 8.50    Types: Cigarettes   Smokeless tobacco: Never  Vaping Use   Vaping Use: Never used  Substance and Sexual Activity   Alcohol use: Yes    Alcohol/week: 5.0 standard  drinks    Types: 5 Shots of liquor per week    Comment: weekends    Drug use: No   Sexual activity: Not on file  Other Topics Concern   Not on file  Social History Narrative   Single, girlfriend, divorced, has 1 child, 63yo daughter wants to go to college, she lives with mother, exercise with walking at work, works in Scientist, 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   Repeat 10 years.  Dr. Scarlette Shorts   ELBOW SURGERY  2019   Past Surgical History:  Procedure Laterality Date   ARM WOUND REPAIR / CLOSURE     right upper posterior arm - teenager, s/p trauma   ARTERY REPAIR     right upper arm - teenager, s/p trauma to right upper arm   COLONOSCOPY  01/24/2016   Diverticulosis in left and right colon.   Repeat 10 years.  Dr. Scarlette Shorts   ELBOW SURGERY  2019   Past Medical History:  Diagnosis Date   Allergy    Arthritis    finger   BPH (benign prostatic hypertrophy) 2020   BPH with elevated PSA   Epididymal cyst    Tobacco use disorder    BP (!) 164/94    Pulse 66    Temp 99 F (37.2 C)    Ht 5\' 9"  (1.753 m)    Wt 171 lb 3.2 oz (77.7 kg)    SpO2 99%    BMI 25.28 kg/m   Opioid Risk Score:   Fall Risk Score:  `1  Depression screen PHQ 2/9  Depression screen [facility] 2/9 09/10/2021 08/10/2021 06/29/2021 06/14/2021 06/01/2021 03/30/2021 03/02/2021  Decreased Interest 0 0 0 0 0 0 0  Down, Depressed, Hopeless 0 0 0 0 0 0 0  PHQ - 2 Score 0 0 0 0 0 0 0  Altered  sleeping - - - - - - 3  Tired, decreased energy - - - - - - 0  Change in appetite - - - - - - 0  Feeling bad or failure about yourself  - - - - - - 0  Trouble concentrating - - - - - - 0  Moving slowly or fidgety/restless - - - - - - 0  Suicidal thoughts - - - - - - 0  PHQ-9 Score - - - - - - 3     Review of Systems  Constitutional: Negative.   HENT: Negative.    Eyes: Negative.   Respiratory: Negative.    Cardiovascular: Negative.   Gastrointestinal: Negative.   Endocrine: Negative.   Genitourinary: Negative.   Musculoskeletal:  Positive for back pain.  Skin: Negative.   Allergic/Immunologic: Negative.   Neurological:  Positive for weakness and numbness.  Hematological: Negative.   Psychiatric/Behavioral: Negative.        Objective:   Physical Exam Vitals and nursing note reviewed.  Constitutional:      Appearance: Normal appearance.  Cardiovascular:     Rate and Rhythm: Normal rate and regular rhythm.     Pulses: Normal pulses.     Heart sounds: Normal heart sounds.  Pulmonary:     Effort: Pulmonary effort is normal.     Breath sounds: Normal breath sounds.  Musculoskeletal:     Cervical back: Normal range of motion and neck supple.     Comments: Normal Muscle Bulk and Muscle Testing Reveals:  Upper Extremities:Full  ROM and Muscle Strength 5/5 Thoracic Paraspinal Tenderness: T-1-T-3 Lumbar Paraspinal Tenderness: L-4-L-5 Lower Extremities: Full ROM and Muscle Strength 5/5 Arises from Chair with ease Narrow Based  Gait     Skin:    General: Skin is warm and dry.  Neurological:     Mental Status: He is alert and oriented to person, place, and time.  Psychiatric:        Mood and Affect: Mood normal.        Behavior: Behavior normal.         Assessment & Plan:  1. Lumbar Radiculitis: No complaints today. Continue HEP as Tolerated. Continue current  medication regimen. Continue to monitor. 09/10/2021 Right Shoulder Tendonitis: Continue HEP as Tolerated.  Continue to monitor. 09/10/2021 Polyarthralgia: Continue HEP as Tolerated: Continue to Alternate Heat and Ice Therapy. Continue to Monitor. 09/10/2021 Polyneuropathy: Continue Current medication regimen. Continue to monitor. 09/10/2021 Chronic Pain Syndrome: Refilled: Hydrocodone 10/325 mg one tablet twice a day as needed for pain #60. We will continue the opioid monitoring program, this consists of regular clinic visits, examinations, urine drug screen, pill counts as well as use of [place] Controlled Substance Reporting system. A 12 month History has been reviewed on the [place] Controlled Substance Reporting System on 09/10/2021.     F/U in 1 month

## 2021-09-12 ENCOUNTER — Encounter: Payer: 59 | Admitting: Medical

## 2021-09-15 LAB — DRUG TOX MONITOR 1 W/CONF, ORAL FLD
Amphetamines: NEGATIVE ng/mL (ref <10)
Barbiturates: NEGATIVE ng/mL (ref <10)
Benzodiazepines: NEGATIVE ng/mL (ref <0.50)
Buprenorphine: NEGATIVE ng/mL (ref <0.10)
Cocaine: NEGATIVE ng/mL (ref <5.0)
Cotinine: 107.6 ng/mL — ABNORMAL HIGH (ref <5.0)
Fentanyl: NEGATIVE ng/mL (ref <0.10)
Heroin Metabolite: NEGATIVE ng/mL (ref <1.0)
MARIJUANA: NEGATIVE ng/mL (ref <2.5)
MDMA: NEGATIVE ng/mL (ref <10)
Meprobamate: NEGATIVE ng/mL (ref <2.5)
Methadone: NEGATIVE ng/mL (ref <5.0)
Nicotine Metabolite: POSITIVE ng/mL — AB (ref <5.0)
Opiates: NEGATIVE ng/mL (ref <2.5)
Phencyclidine: NEGATIVE ng/mL (ref <10)
Tapentadol: NEGATIVE ng/mL (ref <5.0)
Tramadol: NEGATIVE ng/mL (ref <5.0)
Zolpidem: NEGATIVE ng/mL (ref <5.0)

## 2021-09-15 LAB — DRUG TOX ALC METAB W/CON, ORAL FLD: Alcohol Metabolite: NEGATIVE ng/mL (ref <25)

## 2021-09-25 ENCOUNTER — Telehealth: Payer: Self-pay | Admitting: *Deleted

## 2021-09-25 NOTE — Telephone Encounter (Signed)
Oral swab drug screen was negative for medication. He reported taking medication the night before test and had 3 tablets at [facility] but did not show on drug screen. Metabolite for nicotine present so it is not an entirely negative swab.

## 2021-09-28 ENCOUNTER — Ambulatory Visit: Payer: 59 | Admitting: Registered Nurse

## 2021-10-03 ENCOUNTER — Telehealth: Payer: Self-pay | Admitting: Medical

## 2021-10-03 ENCOUNTER — Encounter: Payer: 59 | Admitting: Medical

## 2021-10-03 NOTE — Telephone Encounter (Signed)
Dismissal letter in guarantor snapshot  °

## 2021-10-05 ENCOUNTER — Other Ambulatory Visit: Payer: Self-pay

## 2021-10-05 ENCOUNTER — Encounter: Payer: Self-pay | Admitting: Registered Nurse

## 2021-10-05 ENCOUNTER — Encounter: Payer: 59 | Attending: Registered Nurse | Admitting: Registered Nurse

## 2021-10-05 VITALS — BP 144/97 | HR 70 | Ht 69.0 in | Wt 169.0 lb

## 2021-10-05 DIAGNOSIS — Z5181 Encounter for therapeutic drug level monitoring: Secondary | ICD-10-CM | POA: Insufficient documentation

## 2021-10-05 DIAGNOSIS — M545 Low back pain, unspecified: Secondary | ICD-10-CM | POA: Diagnosis not present

## 2021-10-05 DIAGNOSIS — G629 Polyneuropathy, unspecified: Secondary | ICD-10-CM | POA: Insufficient documentation

## 2021-10-05 DIAGNOSIS — M778 Other enthesopathies, not elsewhere classified: Secondary | ICD-10-CM | POA: Diagnosis present

## 2021-10-05 DIAGNOSIS — Z79899 Other long term (current) drug therapy: Secondary | ICD-10-CM | POA: Insufficient documentation

## 2021-10-05 DIAGNOSIS — M546 Pain in thoracic spine: Secondary | ICD-10-CM | POA: Insufficient documentation

## 2021-10-05 DIAGNOSIS — G894 Chronic pain syndrome: Secondary | ICD-10-CM | POA: Diagnosis present

## 2021-10-05 DIAGNOSIS — M2550 Pain in unspecified joint: Secondary | ICD-10-CM | POA: Insufficient documentation

## 2021-10-05 DIAGNOSIS — G8929 Other chronic pain: Secondary | ICD-10-CM | POA: Diagnosis present

## 2021-10-05 MED ORDER — HYDROCODONE-ACETAMINOPHEN 10-325 MG PO TABS: 1.0000 | ORAL_TABLET | Freq: Two times a day (BID) | ORAL | 0 refills | Status: DC | PRN

## 2021-10-05 NOTE — Progress Notes (Signed)
Subjective:    Patient ID: Jorge Walker, male    DOB: Oct 13, 1965, 56 y.o.   MRN: [id number]  HPI: Jorge Walker is a 56 y.o. male who returns for follow up appointment for chronic pain and medication refill. He states his pain is located in his right shoulder, right arm with tingling and mid- lower back pain He rates his pain 7. His current exercise regime is walking and performing stretching exercises.  Ms. Charolett Bumpers Morphine equivalent is 20.00 MME.   UDS ordered today.    Pain Inventory Average Pain 7 Pain Right Now 7 My pain is intermittent, sharp, burning, dull, stabbing, tingling, and aching  In the last 24 hours, has pain interfered with the following? General activity 5 Relation with others 5 Enjoyment of life 5 What TIME of day is your pain at its worst? morning , evening, and night Sleep (in general) Poor  Pain is worse with: bending, sitting, and standing Pain improves with: medication Relief from Meds: 8  Family History  Problem Relation Age of Onset   Hypertension Mother    Diabetes Mother    Pneumonia Father        died of pneumonia   Cancer Neg Hx    Heart disease Neg Hx    Stroke Neg Hx    Colon cancer Neg Hx    Colon polyps Neg Hx    Esophageal cancer Neg Hx    Rectal cancer Neg Hx    Stomach cancer Neg Hx    Social History   Socioeconomic History   Marital status: Single    Spouse name: Not on file   Number of children: Not on file   Years of education: Not on file   Highest education level: Not on file  Occupational History   Not on file  Tobacco Use   Smoking status: Every Day    Packs/day: 0.50    Years: 17.00    Pack years: 8.50    Types: Cigarettes   Smokeless tobacco: Never  Vaping Use   Vaping Use: Never used  Substance and Sexual Activity   Alcohol use: Yes    Alcohol/week: 5.0 standard drinks    Types: 5 Shots of liquor per week    Comment: weekends    Drug use: No   Sexual activity: Not on file  Other Topics  Concern   Not on file  Social History Narrative   Single, girlfriend, divorced, has 1 child, 78yo daughter wants to go to college, she lives with mother, exercise with walking at work, works in Scientist, 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   Repeat 10 years.  Dr. Scarlette Shorts   ELBOW SURGERY  2019   Past Surgical History:  Procedure Laterality Date   ARM WOUND REPAIR / CLOSURE     right upper posterior arm - teenager, s/p trauma  ARTERY REPAIR     right upper arm - teenager, s/p trauma to right upper arm   COLONOSCOPY  01/24/2016   Diverticulosis in left and right colon.   Repeat 10 years.  Dr. Scarlette Shorts   ELBOW SURGERY  2019   Past Medical History:  Diagnosis Date   Allergy    Arthritis    finger   BPH (benign prostatic hypertrophy) 2020   BPH with elevated PSA   Epididymal cyst    Tobacco use disorder    BP (!) 144/97    Pulse 70    Ht 5\' 9"  (1.753 m)    Wt 169 lb (76.7 kg)    SpO2 98%    BMI 24.96 kg/m   Opioid Risk Score:   Fall Risk Score:  `1  Depression screen PHQ 2/9  Depression screen [facility] 2/9 10/05/2021 09/10/2021 08/10/2021 06/29/2021 06/14/2021 06/01/2021 03/30/2021  Decreased Interest 0 0 0 0 0 0 0  Down, Depressed, Hopeless 0 0 0 0 0 0 0  PHQ - 2 Score 0 0 0 0 0 0 0  Altered sleeping - - - - - - -  Tired, decreased energy - - - - - - -  Change in appetite - - - - - - -  Feeling bad or failure about yourself  - - - - - - -  Trouble  concentrating - - - - - - -  Moving slowly or fidgety/restless - - - - - - -  Suicidal thoughts - - - - - - -  PHQ-9 Score - - - - - - -     Review of Systems  Constitutional: Negative.   HENT: Negative.    Eyes: Negative.   Respiratory: Negative.    Cardiovascular: Negative.   Gastrointestinal: Negative.   Endocrine: Negative.   Genitourinary: Negative.   Musculoskeletal: Negative.   Skin: Negative.   Allergic/Immunologic: Negative.   Neurological:  Positive for numbness.  Hematological: Negative.   Psychiatric/Behavioral:  Positive for sleep disturbance.       Objective:   Physical Exam Vitals and nursing note reviewed.  Constitutional:      Appearance: Normal appearance.  Cardiovascular:     Rate and Rhythm: Normal rate and regular rhythm.     Pulses: Normal pulses.     Heart sounds: Normal heart sounds.  Pulmonary:     Effort: Pulmonary effort is normal.     Breath sounds: Normal breath sounds.  Musculoskeletal:     Cervical back: Normal range of motion and neck supple.     Comments: Normal Muscle Bulk and Muscle Testing Reveals: Upper Extremities: Full ROM and Muscle Strength 5/5 Thoracic Paraspinal Tenderness: T-1-T-7 Mainly Right Side Lumbar Paraspinal Tenderness: L-3-L-5 Mainly Right Side  Lower Extremities: Full ROM and Muscle Strength 5/5 Arises from Table with Ease  Narrow Based Gait     Skin:    General: Skin is warm and dry.  Neurological:     Mental Status: He is alert and oriented to person, place, and time.  Psychiatric:        Mood and Affect: Mood normal.        Behavior: Behavior normal.         Assessment & Plan:  1. Lumbar Radiculitis: Chronic Lower Back Pain:  Continue HEP as Tolerated. Continue current medication regimen. Continue to monitor. 10/05/2021 Right Shoulder Tendonitis: Continue HEP as Tolerated. Continue to monitor. 10/05/2021 Polyarthralgia: Continue HEP as Tolerated: Continue to Alternate Heat and Ice  Therapy. Continue to  Monitor. 10/05/2021 Polyneuropathy: Continue Current medication regimen. Continue to monitor. 10/05/2021 Chronic Pain Syndrome: Refilled: Hydrocodone 10/325 mg one tablet twice a day as needed for pain #60. We will continue the opioid monitoring program, this consists of regular clinic visits, examinations, urine drug screen, pill counts as well as use of [place] Controlled Substance Reporting system. A 12 month History has been reviewed on the [place] Controlled Substance Reporting System on 10/05/2021.     F/U in 1 month

## 2021-10-12 LAB — TOXASSURE SELECT,+ANTIDEPR,UR

## 2021-10-15 ENCOUNTER — Telehealth: Payer: Self-pay | Admitting: Registered Nurse

## 2021-10-15 NOTE — Telephone Encounter (Signed)
Received a message  from Dr Ranell Patrick, Mr. Doro will be discharged. This provider placed a call to Mr. Surrette about his UDS result, he verbalizes understanding.  He's aware he's being discharged from our office and a letter will be sent to his home.  Reviewed the wean down of His Hydrocodone  To once a day  for a week then every other day then discontinued , he verbalizes understanding.

## 2021-10-15 NOTE — Telephone Encounter (Signed)
UDS Results : +ETOH and THC, this is his second positive for ETOH. Sent a message to Dr Ranell Patrick, awaiting response.

## 2021-10-23 ENCOUNTER — Telehealth: Payer: Self-pay | Admitting: *Deleted

## 2021-10-23 NOTE — Telephone Encounter (Signed)
Per [name] Mr Jorge Walker is being discharged from the clinic for having alcohol and marijuana in his urine drug screen. She has already given him his wean down instructions for his hydrocodone. A letter will be sent through [place] and [place].

## 2021-11-02 ENCOUNTER — Encounter: Payer: 59 | Admitting: Registered Nurse

## 2022-02-12 ENCOUNTER — Other Ambulatory Visit: Payer: Self-pay | Admitting: Specialist

## 2022-02-12 DIAGNOSIS — M546 Pain in thoracic spine: Secondary | ICD-10-CM

## 2022-02-14 ENCOUNTER — Ambulatory Visit
Admission: RE | Admit: 2022-02-14 | Discharge: 2022-02-14 | Disposition: A | Discharge status: Home / Self Care | Payer: 59 | Source: Ambulatory Visit | Attending: Specialist | Admitting: Specialist

## 2022-02-14 DIAGNOSIS — M546 Pain in thoracic spine: Secondary | ICD-10-CM

## 2022-02-14 IMAGING — MR MR THORACIC SPINE W/O CM
4 of 6 series · 19 of 48 positions shown · non-contrast
Comparison: No direct comparison study available. Two-view chest
radiograph [DATE]

CLINICAL DATA: Neck and right shoulder pain, back pain for 6 months

EXAM:
MRI THORACIC SPINE WITHOUT CONTRAST
TECHNIQUE: Multiplanar, multisequence MR imaging of the thoracic spine was
performed. No intravenous contrast was administered.

[Series 3: T1 · sagittal · 3.0mm · 1.06mm/px · 2 of 7 slices shown]
[im 1/7]
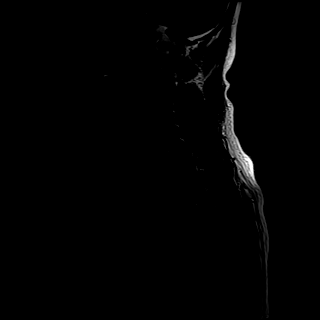
[im 7/7]
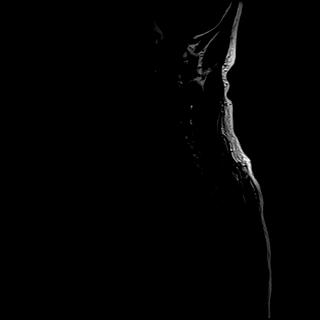

[Series 4: T2 · sagittal · 4.0mm · 0.48mm/px · 6 of 16 slices shown (1 of 3)]
[im 1/16]
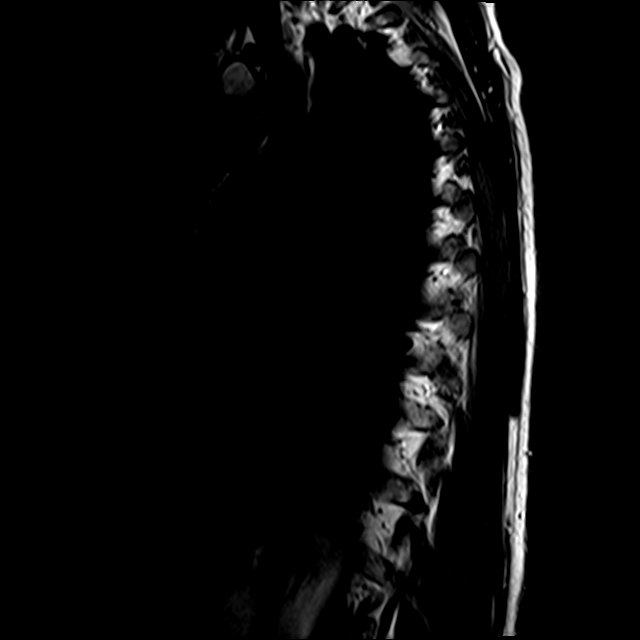
[im 4/16]
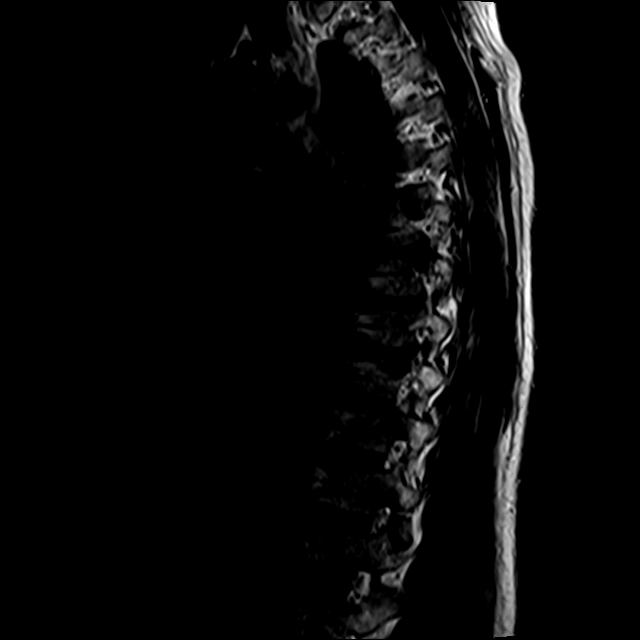
[im 7/16]
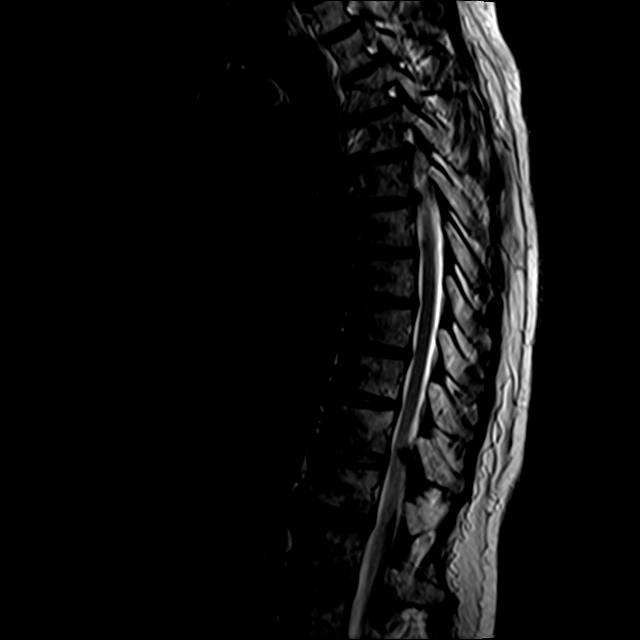
[im 10/16]
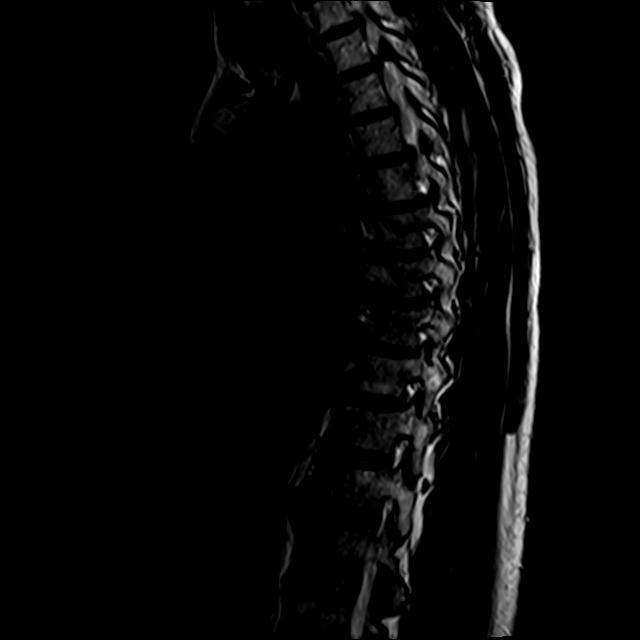
[im 13/16]
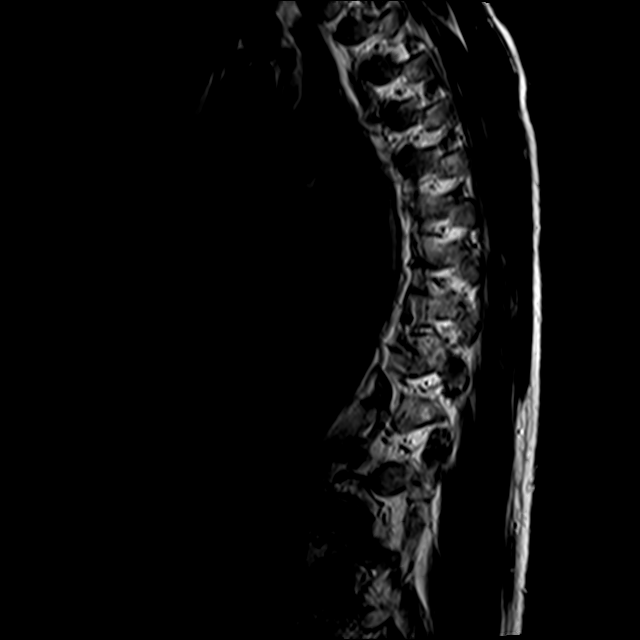
[im 16/16]
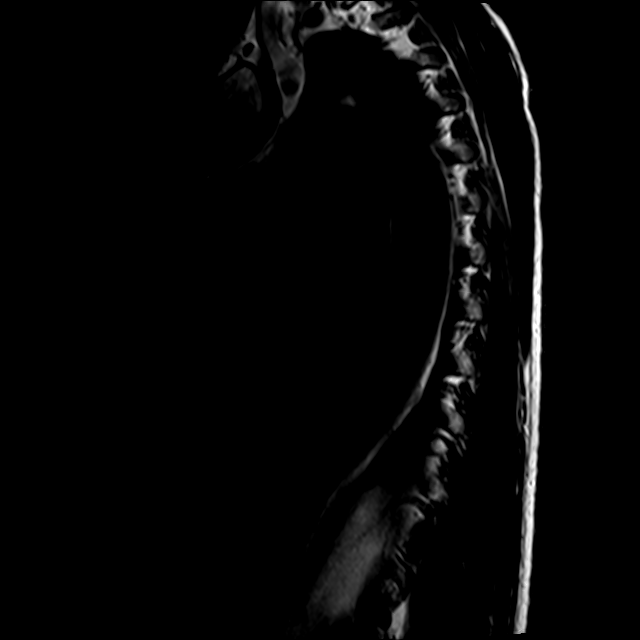

[Series 7: T2 · axial · 4.0mm · 0.39mm/px · z∈[-330,-75]mm · 8 of 40 slices shown (2 of 3)]
[im 1/40]
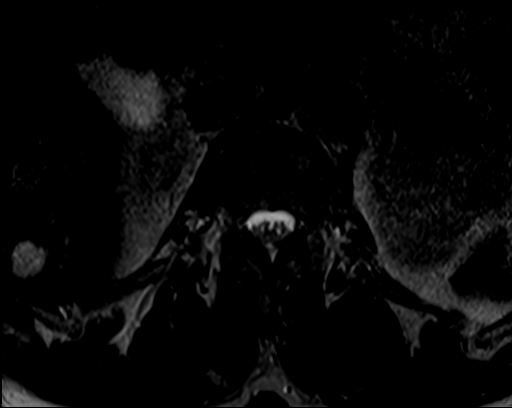
[im 7/40]
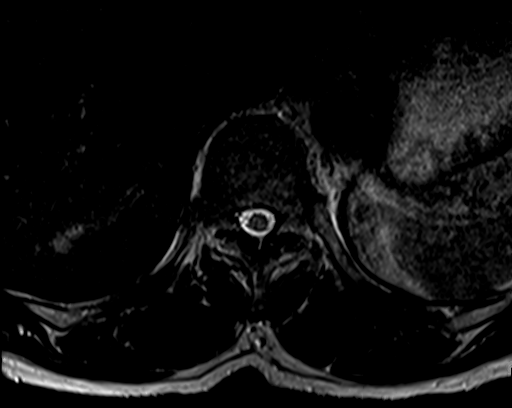
[im 13/40]
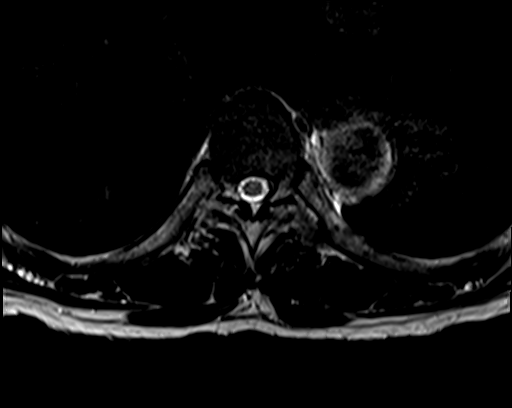
[im 19/40]
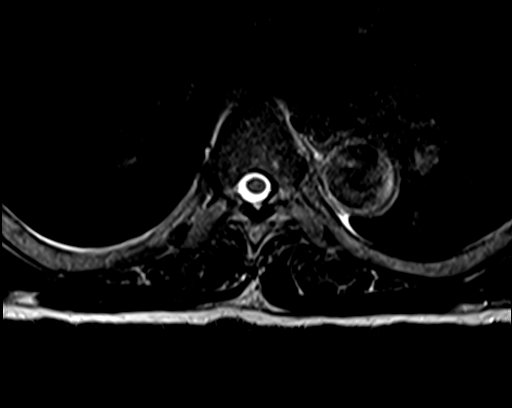
[im 22/40]
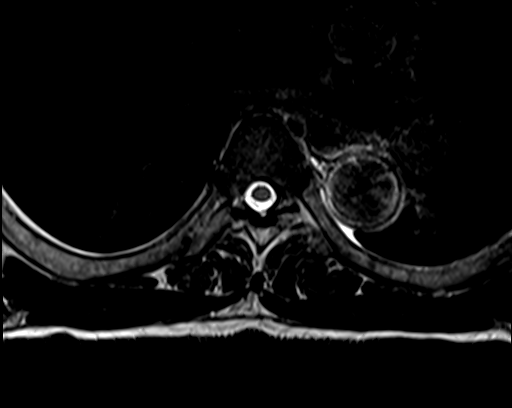
[im 28/40]
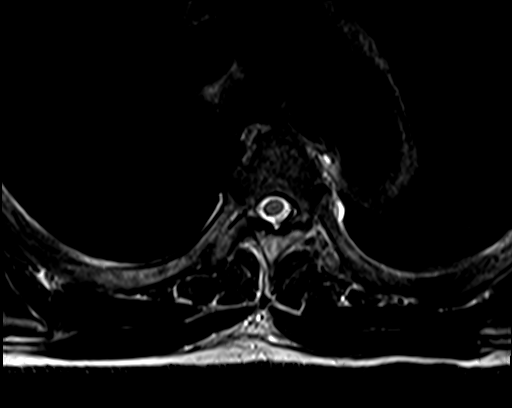
[im 34/40]
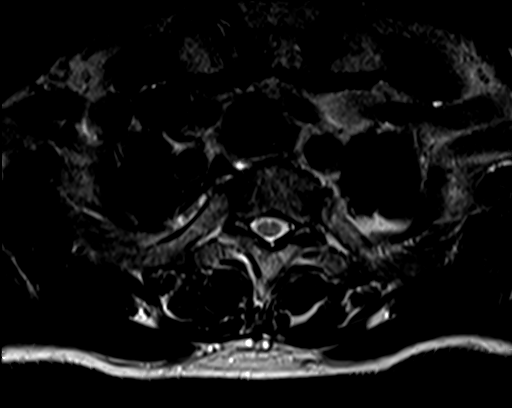
[im 40/40]
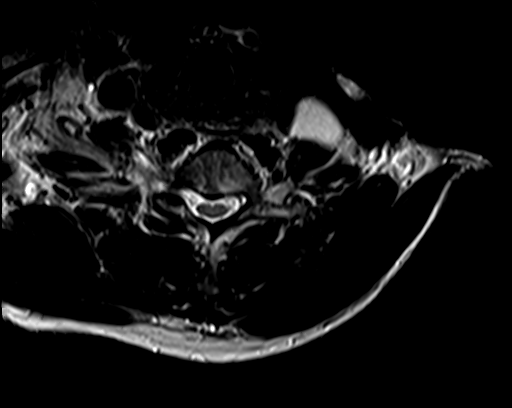

[Series 8: T2 · axial · 4.0mm · 0.39mm/px · z∈[-264,-102]mm · 3 of 40 slices shown (3 of 3)]
[im 7/40]
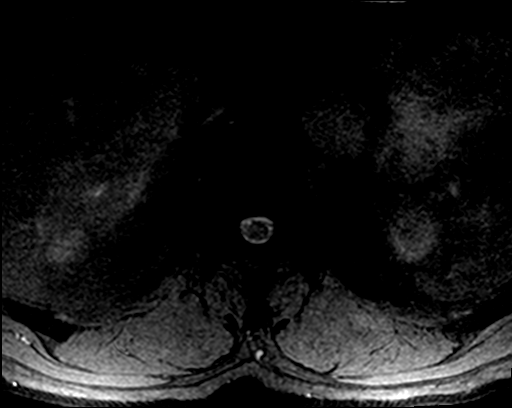
[im 22/40]
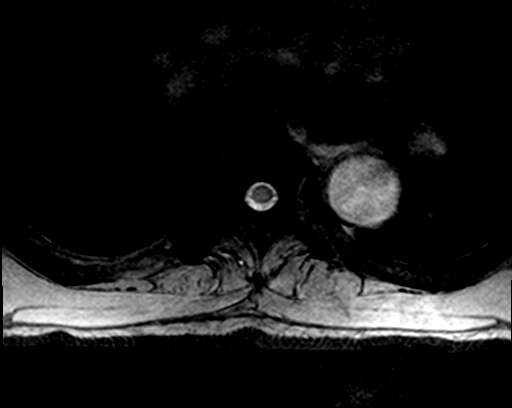
[im 34/40]
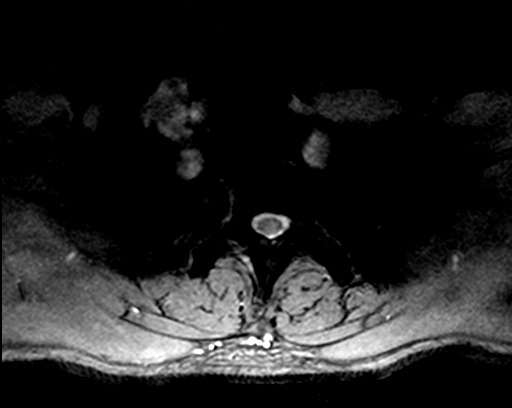

[19 of 48 positions shown; findings below may reference images not displayed]

FINDINGS: Alignment:  Normal.

Vertebrae: Vertebral body heights are preserved. There is no
evidence of acute injury. Background marrow signal is normal. There
is no suspicious marrow signal abnormality or marrow edema.

Cord:  Normal in signal and morphology.

Paraspinal and other soft tissues: T2 hyperintense lesions in the
liver most likely reflects cysts, for which no specific imaging
follow-up is required. The paraspinal soft tissues are unremarkable.

Disc levels:

The disc heights are overall preserved. There is mild multilevel
degenerative endplate change throughout the thoracic spine, most
advanced at T11-T12

T1-T2: Mild disc protrusion and mild facet arthropathy without
significant spinal canal or neural foraminal stenosis

T2-T3: Disc protrusion and left worse than right facet arthropathy
with ligamentum flavum thickening resulting in mild spinal canal
stenosis with slight indentation of the left ventral lateral cord
surface and mild right neural foraminal stenosis

T3-T4: No significant spinal canal or neural foraminal stenosis

T4-T5: Broad-based disc protrusion resulting in mild spinal canal
stenosis without significant neural foraminal stenosis.

T5-T6: Small right paracentral disc protrusion without significant
spinal canal or neural foraminal stenosis

T8-T9 through T12-L1: No significant spinal canal or neural
foraminal stenosis.
IMPRESSION: 1. No evidence of acute injury in the thoracic spine.
2. Overall mild multilevel degenerative changes with scattered disc
protrusions as detailed above without greater than mild spinal canal
stenosis. No evidence of cord or nerve root compression.

## 2022-02-25 ENCOUNTER — Telehealth: Payer: Self-pay | Admitting: Medical

## 2022-02-25 NOTE — Telephone Encounter (Signed)
Received medical records request from [facility] and  forwarded to HIM.

## 2022-11-01 ENCOUNTER — Telehealth: Payer: Self-pay | Admitting: Medical

## 2022-11-01 NOTE — Telephone Encounter (Signed)
Parameds request for medical records forwarded to [facility]

## 2022-11-30 ENCOUNTER — Encounter (HOSPITAL_COMMUNITY): Payer: Self-pay

## 2022-11-30 ENCOUNTER — Ambulatory Visit (HOSPITAL_COMMUNITY)
Admission: EM | Admit: 2022-11-30 | Discharge: 2022-11-30 | Disposition: A | Discharge status: Home / Self Care | Payer: Self-pay | Attending: Family Medicine | Admitting: Family Medicine

## 2022-11-30 ENCOUNTER — Ambulatory Visit (INDEPENDENT_AMBULATORY_CARE_PROVIDER_SITE_OTHER): Payer: Self-pay

## 2022-11-30 DIAGNOSIS — M7041 Prepatellar bursitis, right knee: Secondary | ICD-10-CM

## 2022-11-30 DIAGNOSIS — M25561 Pain in right knee: Secondary | ICD-10-CM

## 2022-11-30 DIAGNOSIS — W19XXXA Unspecified fall, initial encounter: Secondary | ICD-10-CM

## 2022-11-30 MED ORDER — POVIDONE-IODINE 10 % EX SOLN
CUTANEOUS | Status: AC
  Filled 2022-11-30: qty 118

## 2022-11-30 MED ORDER — NAPROXEN 375 MG PO TABS: 375.0000 mg | ORAL_TABLET | Freq: Two times a day (BID) | ORAL | 0 refills | Status: DC

## 2022-11-30 MED ORDER — LIDOCAINE HCL (PF) 1 % IJ SOLN
INTRAMUSCULAR | Status: AC
  Filled 2022-11-30: qty 30

## 2022-11-30 NOTE — Discharge Instructions (Addendum)
Watch for any signs of infection of your knee where we drained it today; this could include redness, hotness, fever. Return promptly with any concerns. Use the ACE bandage to provide compression to your knee for the next one week.

## 2022-11-30 NOTE — ED Triage Notes (Signed)
Pt is here for knot on right knee x few wks , pt has tried tylenol for the pain last night. Pt stated injury is from a previous fall wks ago .

## 2022-12-02 NOTE — ED Provider Notes (Signed)
[facility]   [id number] 11/30/22 Arrival Time: 1009  ASSESSMENT & PLAN:  1. Acute pain of right knee   2. Prepatellar bursitis, right knee    I have personally viewed and independently interpreted the imaging studies obtained this visit. No acute bony changes of RIGHT knee.  Procedure - Aspiration: Area of prepatellar knee cleaned with betadine and allowed to dry. 18g needle inserted laterally to remove approx 15 cc bloody fluid. Does report pressure relief. No complications. Tolerated well. Wrapped with ACE.  Begin: Discharge Medication List as of 11/30/2022 12:08 PM     START taking these medications   Details  naproxen (NAPROSYN) 375 MG tablet Take 1 tablet (375 mg total) by mouth 2 (two) times daily with a meal., Starting Sat 11/30/2022, Normal        Orders Placed This Encounter  Procedures   DG Knee Complete 4 Views Right    Recommend:  Follow-up Information     [facility].   Why: If worsening or failing to improve as anticipated. Contact information: [address] [place] [address] [phone]                  Discharge Instructions      Watch for any signs of infection of your knee where we drained it today; this could include redness, hotness, fever. Return promptly with any concerns. Use the ACE bandage to provide compression to your knee for the next one week.      Reviewed expectations re: course of current medical issues. Questions answered. Outlined signs and symptoms indicating need for more acute intervention. Patient verbalized understanding. After Visit Summary given.  SUBJECTIVE: History from: patient. Jorge Walker is a 57 y.o. male who reports knot on right knee x few wks, pt has tried tylenol for the pain last night. Pt stated injury is from a previous fall wks ago. Is ambulatory. No tx PTA. No extremity sensation changes or weakness. No h/o similar.   Past  Surgical History:  Procedure Laterality Date   ARM WOUND REPAIR / CLOSURE     right upper posterior arm - teenager, s/p trauma   ARTERY REPAIR     right upper arm - teenager, s/p trauma to right upper arm   COLONOSCOPY  01/24/2016   Diverticulosis in left and right colon.   Repeat 10 years.  Dr. Yancey Flemings   ELBOW SURGERY  2019      OBJECTIVE:  Vitals:   11/30/22 1040  BP: (!) 148/100  Pulse: 62  Resp: 16  Temp: 98 F (36.7 C)  TempSrc: Oral  SpO2: 97%    General appearance: alert; no distress HEENT: [place]; AT Neck: supple with FROM Resp: unlabored respirations Extremities: RLE: warm with well perfused appearance; fairly well localized mild tenderness over right anterior knee; without gross deformities; with pre-patellar swelling/effusion; bruising: none; knee ROM: normal CV: brisk extremity capillary refill of bilateral LE; 2+ DP pulse of bilateral LE. Skin: warm and dry; no visible rashes Neurologic: gait normal; normal sensation and strength of RLE Psychological: alert and cooperative; normal mood and affect  Imaging: DG Knee Complete 4 Views Right  Result Date: 11/30/2022 CLINICAL DATA:  Fall several weeks ago. Right knee pain and palpable knot. EXAM: RIGHT KNEE - COMPLETE 4+ VIEW COMPARISON:  None Available. FINDINGS: No evidence of fracture, dislocation, or joint effusion. No evidence of arthropathy or other focal bone abnormality. Prepatellar and infrapatellar soft tissue swelling is  noted. IMPRESSION: Prepatellar and infrapatellar soft tissue swelling. No evidence of fracture or joint effusion. Electronically Signed   By: Danae Orleans M.D.   On: 11/30/2022 11:04       No Known Allergies  Past Medical History:  Diagnosis Date   Allergy    Arthritis    finger   BPH (benign prostatic hypertrophy) 2020   BPH with elevated PSA   Epididymal cyst    Tobacco use disorder    Social History   Socioeconomic History   Marital status: Single    Spouse name: Not on  file   Number of children: Not on file   Years of education: Not on file   Highest education level: Not on file  Occupational History   Not on file  Tobacco Use   Smoking status: Every Day    Packs/day: 0.50    Years: 17.00    Additional pack years: 0.00    Total pack years: 8.50    Types: Cigarettes   Smokeless tobacco: Never  Vaping Use   Vaping Use: Never used  Substance and Sexual Activity   Alcohol use: Yes    Alcohol/week: 5.0 standard drinks of alcohol    Types: 5 Shots of liquor per week    Comment: weekends    Drug use: No   Sexual activity: Not on file  Other Topics Concern   Not on file  Social History Narrative   Single, girlfriend, divorced, has 1 child, 22yo daughter wants to go to college, she lives with mother, exercise with walking at work, works in Teaching laboratory technician at [employer], 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   Social Determinants of Health   Financial Resource Strain: Not on file  Food Insecurity: Not on file  Transportation Needs: Not on file  Physical Activity: Not on file  Stress: Not on file  Social Connections: Not on file   Family History  Problem Relation Age of Onset   Hypertension Mother    Diabetes Mother    Pneumonia Father        died of pneumonia   Cancer Neg Hx    Heart disease Neg Hx    Stroke Neg Hx    Colon cancer Neg Hx    Colon polyps Neg Hx    Esophageal cancer Neg Hx    Rectal cancer Neg Hx    Stomach cancer Neg Hx    Past Surgical History:  Procedure Laterality Date   ARM WOUND REPAIR / CLOSURE     right upper posterior arm - teenager, s/p trauma   ARTERY REPAIR     right upper arm - teenager, s/p trauma to right upper arm   COLONOSCOPY  01/24/2016   Diverticulosis in left and right colon.   Repeat 10 years.  Dr. Yancey Flemings   ELBOW SURGERY  2019       Mardella Layman, MD 12/02/22 [phone]

## 2022-12-20 ENCOUNTER — Ambulatory Visit (INDEPENDENT_AMBULATORY_CARE_PROVIDER_SITE_OTHER): Payer: 59 | Admitting: Primary Care

## 2023-02-12 ENCOUNTER — Telehealth (INDEPENDENT_AMBULATORY_CARE_PROVIDER_SITE_OTHER): Payer: Self-pay | Admitting: Primary Care

## 2023-02-12 NOTE — Telephone Encounter (Signed)
SPoke to pt and he will be at apt on 6/20

## 2023-02-13 ENCOUNTER — Encounter (INDEPENDENT_AMBULATORY_CARE_PROVIDER_SITE_OTHER): Payer: Self-pay | Admitting: Primary Care

## 2023-02-13 ENCOUNTER — Ambulatory Visit (INDEPENDENT_AMBULATORY_CARE_PROVIDER_SITE_OTHER): Payer: Self-pay | Admitting: Primary Care

## 2023-02-13 VITALS — BP 157/98 | HR 50 | Resp 16 | Ht 70.0 in | Wt 183.8 lb

## 2023-02-13 DIAGNOSIS — R972 Elevated prostate specific antigen [PSA]: Secondary | ICD-10-CM

## 2023-02-13 DIAGNOSIS — Z7689 Persons encountering health services in other specified circumstances: Secondary | ICD-10-CM

## 2023-02-13 DIAGNOSIS — F17210 Nicotine dependence, cigarettes, uncomplicated: Secondary | ICD-10-CM

## 2023-02-13 DIAGNOSIS — E559 Vitamin D deficiency, unspecified: Secondary | ICD-10-CM

## 2023-02-13 DIAGNOSIS — Z716 Tobacco abuse counseling: Secondary | ICD-10-CM

## 2023-02-13 DIAGNOSIS — Z1159 Encounter for screening for other viral diseases: Secondary | ICD-10-CM

## 2023-02-13 DIAGNOSIS — R030 Elevated blood-pressure reading, without diagnosis of hypertension: Secondary | ICD-10-CM

## 2023-02-13 NOTE — Progress Notes (Signed)
New Patient Office Visit  Subjective    Patient ID: Jorge Walker, male    DOB: 1966/01/12  Age: 57 y.o. MRN: [id number]  CC:  Chief Complaint  Patient presents with   New Patient (Initial Visit)    HPI Jorge Walker presents to establish care   Outpatient Encounter Medications as of 02/13/2023  Medication Sig   [DISCONTINUED] calcium carbonate (CALCIUM 600) 600 MG TABS tablet Take 1 tablet (600 mg total) by mouth 2 (two) times daily with a meal. (Patient not taking: Reported on 06/29/2021)   [DISCONTINUED] cholecalciferol (VITAMIN D3) 25 MCG (1000 UNIT) tablet Take 1 tablet (1,000 Units total) by mouth daily. (Patient not taking: Reported on 06/29/2021)   [DISCONTINUED] HYDROcodone-acetaminophen (NORCO) 10-325 MG tablet Take 1 tablet by mouth 2 (two) times daily as needed. (Patient not taking: Reported on 02/13/2023)   [DISCONTINUED] lidocaine (LIDODERM) 5 % Place 1 patch onto the skin daily. Remove & Discard patch within 12 hours or as directed by MD (Patient not taking: Reported on 03/30/2021)   [DISCONTINUED] naproxen (NAPROSYN) 375 MG tablet Take 1 tablet (375 mg total) by mouth 2 (two) times daily with a meal. (Patient not taking: Reported on 02/13/2023)   [DISCONTINUED] tamsulosin (FLOMAX) 0.4 MG CAPS capsule Take 2 capsules (0.8 mg total) by mouth daily. (Patient not taking: Reported on 08/10/2021)   No facility-administered encounter medications on file as of 02/13/2023.    Past Medical History:  Diagnosis Date   Allergy    Arthritis    finger   BPH (benign prostatic hypertrophy) 2020   BPH with elevated PSA   Epididymal cyst    Tobacco use disorder     Past Surgical History:  Procedure Laterality Date   ARM WOUND REPAIR / CLOSURE     right upper posterior arm - teenager, s/p trauma   ARTERY REPAIR     right upper arm - teenager, s/p trauma to right upper arm   COLONOSCOPY  01/24/2016   Diverticulosis in left and right colon.   Repeat 10 years.  Dr. Yancey Flemings   ELBOW SURGERY  2019    Family History  Problem Relation Age of Onset   Hypertension Mother    Diabetes Mother    Pneumonia Father        died of pneumonia   Cancer Neg Hx    Heart disease Neg Hx    Stroke Neg Hx    Colon cancer Neg Hx    Colon polyps Neg Hx    Esophageal cancer Neg Hx    Rectal cancer Neg Hx    Stomach cancer Neg Hx     Social History   Socioeconomic History   Marital status: Single    Spouse name: Not on file   Number of children: Not on file   Years of education: Not on file   Highest education level: Not on file  Occupational History   Not on file  Tobacco Use   Smoking status: Every Day    Packs/day: 0.50    Years: 17.00    Additional pack years: 0.00    Total pack years: 8.50    Types: Cigarettes   Smokeless tobacco: Never  Vaping Use   Vaping Use: Never used  Substance and Sexual Activity   Alcohol use: Yes    Alcohol/week: 5.0 standard drinks of alcohol    Types: 5 Shots of liquor per week    Comment: weekends    Drug use: No  Sexual activity: Not on file  Other Topics Concern   Not on file  Social History Narrative   Single, girlfriend, divorced, has 1 child, 22yo daughter wants to go to college, she lives with mother, exercise with walking at work, works in Teaching laboratory technician at [employer], 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   Social Determinants of Health   Financial Resource Strain: Not on file  Food Insecurity: Not on file  Transportation Needs: Not on file  Physical Activity: Not on file  Stress: Not on file  Social Connections: Not on file  Intimate Partner Violence: Not on file    ROS Comprehensive ROS Pertinent positive and negative noted in HPI       Objective    Blood Pressure (Abnormal) 157/98 (BP Location: Right Arm, Patient Position: Sitting, Cuff Size: Normal)   Pulse (Abnormal) 50   Respiration 16   Height 5\' 10"  (1.778 m)   Weight 183 lb 12.8 oz (83.4 kg)   Oxygen Saturation 100%   Body Mass  Index 26.37 kg/m   Physical Exam Vitals reviewed.  Constitutional:      Appearance: Normal appearance. He is normal weight.  HENT:     Head: Normocephalic.     Right Ear: Tympanic membrane and external ear normal.     Left Ear: Tympanic membrane and external ear normal.  Eyes:     Extraocular Movements: Extraocular movements intact.     Pupils: Pupils are equal, round, and reactive to light.  Cardiovascular:     Rate and Rhythm: Normal rate and regular rhythm.  Pulmonary:     Effort: Pulmonary effort is normal.     Breath sounds: Normal breath sounds.  Abdominal:     General: Bowel sounds are normal.     Palpations: Abdomen is soft.  Musculoskeletal:        General: Normal range of motion.     Cervical back: Normal range of motion.  Skin:    General: Skin is warm and dry.  Neurological:     Mental Status: He is alert and oriented to person, place, and time.  Psychiatric:        Mood and Affect: Mood normal.        Behavior: Behavior normal.       Assessment & Plan:  Jorge Walker was seen today for new patient (initial visit).  Diagnoses and all orders for this visit:  Encounter to establish care -     CMP14+EGFR -     CBC with Differential  Vitamin D deficiency -     Vitamin D, 25-hydroxy  Elevated PSA -     PSA  Encounter for HCV screening test for low risk patient -     HCV Ab w Reflex to Quant PCR  Tobacco abuse counseling - I have recommended complete cessation of tobacco use. I have discussed various options available for assistance with tobacco cessation including over the counter methods (Nicotine gum, patch and lozenges). We also discussed prescription options (Chantix, Nicotine Inhaler / Nasal Spray). The patient is not interested in pursuing any prescription tobacco cessation options at this time. - Patient declines at this time.    Elevated blood-pressure reading without diagnosis of hypertension  DIET: Limit salt intake, read nutrition labels to check  salt content, limit fried and high fatty foods  Avoid using multisymptom OTC cold preparations that generally contain sudafed which can rise BP. Consult with pharmacist on best cold relief products to use for persons with HTN  EXERCISE Discussed incorporating exercise such as walking - 30 minutes most days of the week and can do in 10 minute intervals      No follow-ups on file.   Grayce Sessions, NP

## 2023-02-14 ENCOUNTER — Telehealth: Payer: Self-pay | Admitting: Primary Care

## 2023-02-14 ENCOUNTER — Other Ambulatory Visit (INDEPENDENT_AMBULATORY_CARE_PROVIDER_SITE_OTHER): Payer: Self-pay

## 2023-02-14 DIAGNOSIS — R972 Elevated prostate specific antigen [PSA]: Secondary | ICD-10-CM

## 2023-02-14 DIAGNOSIS — E559 Vitamin D deficiency, unspecified: Secondary | ICD-10-CM

## 2023-02-14 LAB — VITAMIN D 25 HYDROXY (VIT D DEFICIENCY, FRACTURES): Vit D, 25-Hydroxy: 17.4 ng/mL — ABNORMAL LOW (ref 30.0–100.0)

## 2023-02-14 LAB — CMP14+EGFR
ALT: 15 IU/L (ref 0–44)
AST: 29 IU/L (ref 0–40)
Albumin: 4.5 g/dL (ref 3.8–4.9)
Alkaline Phosphatase: 72 IU/L (ref 44–121)
BUN/Creatinine Ratio: 13 (ref 9–20)
BUN: 14 mg/dL (ref 6–24)
Bilirubin Total: 0.3 mg/dL (ref 0.0–1.2)
CO2: 24 mmol/L (ref 20–29)
Calcium: 9.9 mg/dL (ref 8.7–10.2)
Chloride: 106 mmol/L (ref 96–106)
Creatinine, Ser: 1.06 mg/dL (ref 0.76–1.27)
Globulin, Total: 2.2 g/dL (ref 1.5–4.5)
Glucose: 100 mg/dL — ABNORMAL HIGH (ref 70–99)
Potassium: 5 mmol/L (ref 3.5–5.2)
Sodium: 142 mmol/L (ref 134–144)
Total Protein: 6.7 g/dL (ref 6.0–8.5)
eGFR: 82 mL/min/{1.73_m2} (ref >59)

## 2023-02-14 LAB — CBC WITH DIFFERENTIAL/PLATELET
Basophils Absolute: 0.1 10*3/uL (ref 0.0–0.2)
Basos: 1 %
EOS (ABSOLUTE): 0.3 10*3/uL (ref 0.0–0.4)
Eos: 5 %
Hematocrit: 41.2 % (ref 37.5–51.0)
Hemoglobin: 13.2 g/dL (ref 13.0–17.7)
Immature Grans (Abs): 0 10*3/uL (ref 0.0–0.1)
Immature Granulocytes: 0 %
Lymphocytes Absolute: 2.9 10*3/uL (ref 0.7–3.1)
Lymphs: 48 %
MCH: 27 pg (ref 26.6–33.0)
MCHC: 32 g/dL (ref 31.5–35.7)
MCV: 84 fL (ref 79–97)
Monocytes Absolute: 0.5 10*3/uL (ref 0.1–0.9)
Monocytes: 9 %
Neutrophils Absolute: 2.2 10*3/uL (ref 1.4–7.0)
Neutrophils: 37 %
Platelets: 195 10*3/uL (ref 150–450)
RBC: 4.89 x10E6/uL (ref 4.14–5.80)
RDW: 13.7 % (ref 11.6–15.4)
WBC: 6.1 10*3/uL (ref 3.4–10.8)

## 2023-02-14 LAB — PSA: Prostate Specific Ag, Serum: 5.8 ng/mL — ABNORMAL HIGH (ref 0.0–4.0)

## 2023-02-14 LAB — HCV INTERPRETATION

## 2023-02-14 LAB — HCV AB W REFLEX TO QUANT PCR: HCV Ab: NONREACTIVE

## 2023-02-14 MED ORDER — VITAMIN D (ERGOCALCIFEROL) 1.25 MG (50000 UNIT) PO CAPS: 50000.0000 [IU] | ORAL_CAPSULE | ORAL | 0 refills | Status: DC

## 2023-02-14 NOTE — Telephone Encounter (Signed)
Copied from CRM [id number]. Topic: General - Inquiry >> Feb 14, 2023 12:54 PM Marlow Baars wrote: Reason for CRM: The patient called in stating he saw his blood work results in my chart and wants to go over them with a nurse as soon as possible. Please assist patient further

## 2023-02-14 NOTE — Telephone Encounter (Signed)
Per Marcelino Duster PSA is elevated will place referral to urology and will send in rx for flomax. Vit D is low will prescribe 50,000 units of Vit D take 1 capsule 1 time a week for 8 weeks then purchase 2,000 untis of vit d otc    Contacted pt and went over provider message pt is aware that referral for urology has been placed and vit d has been sent to the pharmacy and the provider will send in flomax. Pt doesn't have any questions or concerns

## 2023-02-19 ENCOUNTER — Ambulatory Visit (INDEPENDENT_AMBULATORY_CARE_PROVIDER_SITE_OTHER): Payer: 59 | Admitting: Primary Care

## 2023-02-25 ENCOUNTER — Ambulatory Visit (INDEPENDENT_AMBULATORY_CARE_PROVIDER_SITE_OTHER): Payer: Self-pay | Admitting: Urology

## 2023-02-25 ENCOUNTER — Encounter: Payer: Self-pay | Admitting: Urology

## 2023-02-25 ENCOUNTER — Other Ambulatory Visit (INDEPENDENT_AMBULATORY_CARE_PROVIDER_SITE_OTHER): Payer: Self-pay | Admitting: Primary Care

## 2023-02-25 VITALS — BP 173/98 | HR 54 | Ht 70.0 in | Wt 174.0 lb

## 2023-02-25 DIAGNOSIS — R972 Elevated prostate specific antigen [PSA]: Secondary | ICD-10-CM

## 2023-02-25 DIAGNOSIS — N401 Enlarged prostate with lower urinary tract symptoms: Secondary | ICD-10-CM

## 2023-02-25 LAB — URINALYSIS, ROUTINE W REFLEX MICROSCOPIC
Bilirubin, UA: NEGATIVE
Glucose, UA: NEGATIVE
Ketones, UA: NEGATIVE
Leukocytes,UA: NEGATIVE
Nitrite, UA: NEGATIVE
Protein,UA: NEGATIVE
RBC, UA: NEGATIVE
Specific Gravity, UA: 1.025 (ref 1.005–1.030)
Urobilinogen, Ur: 0.2 mg/dL (ref 0.2–1.0)
pH, UA: 6 (ref 5.0–7.5)

## 2023-02-25 NOTE — Patient Instructions (Signed)
Prostate Biopsy Instructions  Stop all aspirin or blood thinners (aspirin, plavix, coumadin, warfarin, motrin, ibuprofen, advil, aleve, naproxen, naprosyn) for 7 days prior to the procedure.  If you have any questions about stopping these medications, please contact your primary care physician or cardiologist.  Having a light meal prior to the procedure is recommended.  If you are diabetic or have low blood sugar please bring a small snack or glucose tablet.  A Fleets enema is needed and can be purchased over the counter at a local pharmacy. This will need to be administered 2 hours prior to your procedure. Antibiotics will be administered in the clinic at the time of the procedure unless otherwise specified.    If you have any questions or concerns, please feel free to call the office at [phone] or send a Mychart message.   Thank you,  Staff at [facility]  

## 2023-02-25 NOTE — Progress Notes (Signed)
Assessment: 1. Elevated PSA   2. Benign localized prostatic hyperplasia with lower urinary tract symptoms (LUTS)      Plan: Today had a long discussion with the patient regarding his lower urinary tract symptoms as well as elevated PSA along with the issues and controversies regarding prostate cancer early detection.  We discussed options for further evaluation including prostate imaging as well as biopsy.  Following our discussion today patient elects to proceed with TRUS/BX.  Nature procedure discussed in detail today including potential adverse events and complications especially emphasizing risk for bleeding and for infection and need for periprocedural antibiotics.  All of patient's questions were answered we will schedule next available.  Chief Complaint: elevated psa  History of Present Illness:  Jorge Walker is a 57 y.o. male who is seen in consultation from Gwinda Passe NP, for evaluation of elevated psa.  Patient is a longtime smoker but otherwise quite healthy without GU complaints other than moderate stable lower urinary tract symptoms. IPSS = 12/2.  Works as a Sales executive.  Patient has been followed for LUTS and elevated psa at [facility] (Dr. Ronal Fear). Was previously on tamsulosin 0.8mg  daily with good response. Last seen by Malena Peer NP and was supposed to FU for consideration of prostate biopsy-  did not follow up  No family history of prostate cancer. No ED issues.  PSA data: 11/2017  3.7 01/2019  4.8 03/2019  3.5 06/2019 5.3 04/2020  4.0 10/2020  6.17  01/2023  5.8  Past Medical History:  Past Medical History:  Diagnosis Date   Allergy    Arthritis    finger   BPH (benign prostatic hypertrophy) 2020   BPH with elevated PSA   Epididymal cyst    Tobacco use disorder     Past Surgical History:  Past Surgical History:  Procedure Laterality Date   ARM WOUND REPAIR / CLOSURE     right upper posterior arm - teenager, s/p trauma   ARTERY REPAIR      right upper arm - teenager, s/p trauma to right upper arm   COLONOSCOPY  01/24/2016   Diverticulosis in left and right colon.   Repeat 10 years.  Dr. Yancey Flemings   ELBOW SURGERY  2019    Allergies:  No Known Allergies  Family History:  Family History  Problem Relation Age of Onset   Hypertension Mother    Diabetes Mother    Pneumonia Father        died of pneumonia   Cancer Neg Hx    Heart disease Neg Hx    Stroke Neg Hx    Colon cancer Neg Hx    Colon polyps Neg Hx    Esophageal cancer Neg Hx    Rectal cancer Neg Hx    Stomach cancer Neg Hx     Social History:  Social History   Tobacco Use   Smoking status: Every Day    Packs/day: 0.50    Years: 17.00    Additional pack years: 0.00    Total pack years: 8.50    Types: Cigarettes   Smokeless tobacco: Never  Vaping Use   Vaping Use: Never used  Substance Use Topics   Alcohol use: Yes    Alcohol/week: 5.0 standard drinks of alcohol    Types: 5 Shots of liquor per week    Comment: weekends    Drug use: No    Review of symptoms:  Constitutional:  Negative for unexplained weight loss, night sweats, fever,  chills ENT:  Negative for nose bleeds, sinus pain, painful swallowing CV:  Negative for chest pain, shortness of breath, exercise intolerance, palpitations, loss of consciousness Resp:  Negative for cough, wheezing, shortness of breath GI:  Negative for nausea, vomiting, diarrhea, bloody stools GU:  Positives noted in HPI; otherwise negative for gross hematuria, dysuria, urinary incontinence Neuro:  Negative for seizures, poor balance, limb weakness, slurred speech Psych:  Negative for lack of energy, depression, anxiety Endocrine:  Negative for polydipsia, polyuria, symptoms of hypoglycemia (dizziness, hunger, sweating) Hematologic:  Negative for anemia, purpura, petechia, prolonged or excessive bleeding, use of anticoagulants  Allergic:  Negative for difficulty breathing or choking as a result of exposure to  anything; no shellfish allergy; no allergic response (rash/itch) to materials, foods  Physical exam: BP (!) 173/98   Pulse (!) 54   Ht 5\' 10"  (1.778 m)   Wt 174 lb (78.9 kg)   BMI 24.97 kg/m  GENERAL APPEARANCE:  Well appearing, well developed, well nourished, NAD  GU:  nl ext genitalia DRE:  nst, prostate large 60gm with mild symmetry R greater than L.  NO focal induration or nodularity  Results: UA neg

## 2023-03-03 ENCOUNTER — Other Ambulatory Visit (INDEPENDENT_AMBULATORY_CARE_PROVIDER_SITE_OTHER): Payer: Self-pay | Admitting: Primary Care

## 2023-03-03 ENCOUNTER — Ambulatory Visit (INDEPENDENT_AMBULATORY_CARE_PROVIDER_SITE_OTHER): Payer: 59 | Admitting: Primary Care

## 2023-03-03 ENCOUNTER — Encounter (INDEPENDENT_AMBULATORY_CARE_PROVIDER_SITE_OTHER): Payer: Self-pay | Admitting: Primary Care

## 2023-03-03 VITALS — BP 144/96 | HR 58 | Resp 16 | Wt 183.2 lb

## 2023-03-03 DIAGNOSIS — I10 Essential (primary) hypertension: Secondary | ICD-10-CM | POA: Diagnosis not present

## 2023-03-03 MED ORDER — AMLODIPINE-OLMESARTAN 10-40 MG PO TABS: 1.0000 | ORAL_TABLET | Freq: Every day | ORAL | 1 refills | Status: DC

## 2023-03-03 NOTE — Progress Notes (Signed)
[facility]   Jorge Walker is a 57 y.o. male presents for hypertension evaluation, Denies shortness of breath, headaches, chest pain or lower extremity edema, sudden onset, vision changes, unilateral weakness, dizziness, paresthesias . Today after lifestyle modifications Bp remains elevated and will start medication.  Dietary habits include: processed foods and resurants  Exercise habits include:walking Family / Social history: Mom-HTN   Past Medical History:  Diagnosis Date   Allergy    Arthritis    finger   BPH (benign prostatic hypertrophy) 2020   BPH with elevated PSA   Epididymal cyst    Tobacco use disorder    Past Surgical History:  Procedure Laterality Date   ARM WOUND REPAIR / CLOSURE     right upper posterior arm - teenager, s/p trauma   ARTERY REPAIR     right upper arm - teenager, s/p trauma to right upper arm   COLONOSCOPY  01/24/2016   Diverticulosis in left and right colon.   Repeat 10 years.  Dr. Yancey Flemings   ELBOW SURGERY  2019   No Known Allergies Current Outpatient Medications on File Prior to Visit  Medication Sig Dispense Refill   Vitamin D, Ergocalciferol, (DRISDOL) 1.25 MG (50000 UNIT) CAPS capsule Take 1 capsule (50,000 Units total) by mouth every 7 (seven) days. 8 capsule 0   No current facility-administered medications on file prior to visit.   Social History   Socioeconomic History   Marital status: Single    Spouse name: Not on file   Number of children: Not on file   Years of education: Not on file   Highest education level: Not on file  Occupational History   Not on file  Tobacco Use   Smoking status: Every Day    Packs/day: 0.50    Years: 17.00    Additional pack years: 0.00    Total pack years: 8.50    Types: Cigarettes   Smokeless tobacco: Never  Vaping Use   Vaping Use: Never used  Substance and Sexual Activity   Alcohol use: Yes    Alcohol/week: 5.0 standard drinks of alcohol    Types: 5 Shots of  liquor per week    Comment: weekends    Drug use: No   Sexual activity: Not on file  Other Topics Concern   Not on file  Social History Narrative   Single, girlfriend, divorced, has 1 child, 22yo daughter wants to go to college, she lives with mother, exercise with walking at work, works in Teaching laboratory technician at [employer], 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   Social Determinants of Health   Financial Resource Strain: Not on file  Food Insecurity: Not on file  Transportation Needs: Not on file  Physical Activity: Not on file  Stress: Not on file  Social Connections: Not on file  Intimate Partner Violence: Not on file   Family History  Problem Relation Age of Onset   Hypertension Mother    Diabetes Mother    Pneumonia Father        died of pneumonia   Cancer Neg Hx    Heart disease Neg Hx    Stroke Neg Hx    Colon cancer Neg Hx    Colon polyps Neg Hx    Esophageal cancer Neg Hx    Rectal cancer Neg Hx    Stomach cancer Neg Hx      OBJECTIVE:  Vitals:   03/03/23 1119 03/03/23 1120  BP: (Abnormal) 150/86 (Abnormal) 144/96  Pulse: (Abnormal) 58   Resp: 16   SpO2: 100%   Weight: 183 lb 3.2 oz (83.1 kg)     Physical Exam Vitals reviewed.  Constitutional:      Appearance: Normal appearance.  HENT:     Head: Normocephalic.  Eyes:     Extraocular Movements: Extraocular movements intact.  Cardiovascular:     Rate and Rhythm: Normal rate and regular rhythm.  Pulmonary:     Effort: Pulmonary effort is normal.     Breath sounds: Normal breath sounds.  Abdominal:     General: Bowel sounds are normal.     Palpations: Abdomen is soft.  Musculoskeletal:        General: Normal range of motion.     Cervical back: Normal range of motion.  Skin:    General: Skin is warm and dry.  Neurological:     Mental Status: He is alert and oriented to person, place, and time.  Psychiatric:        Mood and Affect: Mood normal.        Behavior: Behavior normal.     ROS Comprehensive ROS Pertinent positive and negative noted in HPI   Last 3 Office BP readings: BP Readings from Last 3 Encounters:  03/03/23 (Abnormal) 144/96  02/25/23 (Abnormal) 173/98  02/13/23 (Abnormal) 157/98    BMET    Component Value Date/Time   NA 142 02/13/2023 0924   K 5.0 02/13/2023 0924   CL 106 02/13/2023 0924   CO2 24 02/13/2023 0924   GLUCOSE 100 (H) 02/13/2023 0924   GLUCOSE 76 01/02/2016 1258   BUN 14 02/13/2023 0924   CREATININE 1.06 02/13/2023 0924   CREATININE 1.03 01/02/2016 1258   CALCIUM 9.9 02/13/2023 0924   GFRNONAA 65 09/06/2020 1601   GFRAA 75 09/06/2020 1601    Renal function: Estimated Creatinine Clearance: 79.4 mL/min (by C-G formula based on SCr of 1.06 mg/dL).  Clinical ASCVD: Yes  The 10-year ASCVD risk score (Arnett DK, et al., 2019) is: 15.2%   Values used to calculate the score:     Age: 57 years     Sex: Male     Is Non-Hispanic African American: Yes     Diabetic: No     Tobacco smoker: Yes     Systolic Blood Pressure: 144 mmHg     Is BP treated: No     HDL Cholesterol: 37 mg/dL     Total Cholesterol: 158 mg/dL  ASCVD risk factors include- [place]   ASSESSMENT & PLAN: Azariah was seen today for blood pressure check.  Diagnoses and all orders for this visit:  Essential hypertension   -Counseled on lifestyle modifications for blood pressure control including reduced dietary sodium, increased exercise, weight reduction and adequate sleep. Also, educated patient about the risk for cardiovascular events, stroke and heart attack. Also counseled patient about the importance of medication adherence. If you participate in smoking, it is important to stop using tobacco as this will increase the risks associated with uncontrolled blood pressure.   -Goal BP:  For patients younger than 60: Goal BP < 130/80. For patients 60 and older: Goal BP < 140/90. For patients with diabetes: Goal BP < 130/80. Your most recent BP:  144/96  Minimize salt intake. Minimize alcohol intake   Other orders -     amLODipine-olmesartan (AZOR) 10-40 MG tablet; Take 1 tablet by mouth daily.  This note has been created with Education officer, environmental. Any transcriptional errors are  unintentional.   Grayce Sessions, NP 03/03/2023, 11:46 AM

## 2023-03-03 NOTE — Telephone Encounter (Signed)
Requested Prescriptions  Pending Prescriptions Disp Refills   amLODipine-olmesartan (AZOR) 10-40 MG tablet 90 tablet 1    Sig: Take 1 tablet by mouth daily.     Cardiovascular: CCB + ARB Combos Failed - 03/03/2023  5:07 PM      Failed - Last BP in normal range    BP Readings from Last 1 Encounters:  03/03/23 (!) 144/96         Passed - K in normal range and within 180 days    Potassium  Date Value Ref Range Status  02/13/2023 5.0 3.5 - 5.2 mmol/L Final         Passed - Cr in normal range and within 180 days    Creat  Date Value Ref Range Status  01/02/2016 1.03 0.70 - 1.33 mg/dL Final   Creatinine, Ser  Date Value Ref Range Status  02/13/2023 1.06 0.76 - 1.27 mg/dL Final         Passed - Na in normal range and within 180 days    Sodium  Date Value Ref Range Status  02/13/2023 142 134 - 144 mmol/L Final         Passed - Patient is not pregnant      Passed - Valid encounter within last 6 months    Recent Outpatient Visits           Today Essential hypertension   [place] [facility] Grayce Sessions, NP   2 weeks ago Encounter to establish care   [place] [facility] Grayce Sessions, NP       Future Appointments             In 2 months Randa Evens, Kinnie Scales, NP  [facility]

## 2023-03-03 NOTE — Patient Instructions (Signed)
Amlodipine; Olmesartan Tablets What is this medication? AMLODIPINE; OLMESARTAN (am LOE di peen; all mi SAR tan) treats high blood pressure. It works by relaxing blood vessels, which decreases the amount of work the heart has to do. It is a combination of a calcium channel blocker and an ARB. This medicine may be used for other purposes; ask your health care provider or pharmacist if you have questions. COMMON BRAND NAME(S): AZOR What should I tell my care team before I take this medication? They need to know if you have any of these conditions: Heart problems, such as heart failure or aortic stenosis Kidney disease Liver disease An unusual or allergic reaction to amlodipine, olmesartan, other medications, foods, dyes, or preservatives Pregnant or trying to get pregnant Breast-feeding How should I use this medication? Take this medication by mouth. Take it as directed on the prescription label at the same time every day. You can take it with or without food. If it upsets your stomach, take it with food. Keep taking it unless your care team tells you to stop. Talk to your care team about the use of this medication in children. Special care may be needed. Overdosage: If you think you have taken too much of this medicine contact a poison control center or emergency room at once. NOTE: This medicine is only for you. Do not share this medicine with others. What if I miss a dose? If you miss a dose, take it as soon as you can. If it is almost time for your next dose, take only that dose. Do not take double or extra doses. What may interact with this medication? This medication may interact with the following: Eplerenone Grapefruit Medications used for sleep during surgery Melatonin Potassium supplements Rifampin Salt substitutes Some diuretics St. John's Wort This list may not describe all possible interactions. Give your health care provider a list of all the medicines, herbs,  non-prescription drugs, or dietary supplements you use. Also tell them if you smoke, drink alcohol, or use illegal drugs. Some items may interact with your medicine. What should I watch for while using this medication? Visit your care team for regular checks on your progress. Check your blood pressure as directed. Ask your care team what your blood pressure should be and when you should contact them. Women should inform their care team if they wish to become pregnant or think they might be pregnant. There is a potential for serious side effects to an unborn child. Talk to your care team or pharmacist for more information. You may get drowsy or dizzy. Do not drive, use machinery, or do anything that needs mental alertness until you know how this medication affects you. Do not sit or stand up quickly, especially if you are an older patient. This reduces the risk of dizzy or fainting spells. Alcohol may interfere with the effect of this medication. Avoid alcoholic drinks. What side effects may I notice from receiving this medication? Side effects that you should report to your care team as soon as possible: Allergic reactions--skin rash, itching, hives, swelling of the face, lips, tongue, or throat Heart attack--pain or tightness in the chest, shoulders, arms, or jaw, nausea, shortness of breath, cold or clammy skin, feeling faint or lightheaded High potassium level--muscle weakness, fast or irregular heartbeat Kidney injury--decrease in the amount of urine, swelling of the ankles, hands, or feet Low blood pressure--dizziness, feeling faint or lightheaded, blurry vision Severe diarrhea Worsening chest pain (angina)--pain, pressure, or tightness in the chest, neck,  back, or arms Side effects that usually do not require medical attention (report to your care team if they continue or are bothersome): Constipation Facial flushing, redness Fatigue Headache Nausea Stomach pain Swelling of the ankles,  hands, or feet This list may not describe all possible side effects. Call your doctor for medical advice about side effects. You may report side effects to FDA at 1-800-FDA-1088. Where should I keep my medication? Keep out of the reach of children and pets. Store at room temperature between 15 and 30 degrees C (59 and 86 degrees F). Throw away any unused medication after the expiration date. NOTE: This sheet is a summary. It may not cover all possible information. If you have questions about this medicine, talk to your doctor, pharmacist, or health care provider.  2024 Elsevier/Gold Standard (2021-09-06 00:00:00)

## 2023-03-03 NOTE — Telephone Encounter (Signed)
Medication Refill - Medication: amLODipine-olmesartan (AZOR) 10-40 MG  Pt says [facility]'t take [facility]  Has the patient contacted their pharmacy? yes (Agent: If no, request that the patient contact the pharmacy for the refill. If patient does not wish to contact the pharmacy document the reason why and proceed with request.) (Agent: If yes, when and what did the pharmacy advise?)contact pcp  Preferred Pharmacy (with phone number or street name):  [address], [place], [place] [address]  Has the patient been seen for an appointment in the last year OR does the patient have an upcoming appointment? yes  Agent: Please be advised that RX refills may take up to 3 business days. We ask that you follow-up with your pharmacy.

## 2023-03-10 ENCOUNTER — Other Ambulatory Visit (INDEPENDENT_AMBULATORY_CARE_PROVIDER_SITE_OTHER): Payer: Self-pay

## 2023-03-10 MED ORDER — AMLODIPINE BESYLATE 10 MG PO TABS: 10.0000 mg | ORAL_TABLET | Freq: Every day | ORAL | 0 refills | Status: DC

## 2023-03-10 NOTE — Progress Notes (Signed)
Pt came into the office today with facial swelling from amLODipine-olmesartan (AZOR) 10-40 MG . Informed pt to stop medication. Made provider aware and per provider discontine amLODipine-olmesartan (AZOR) 10-40 MG and start pt on amLODipine (NORVASC) 10 MG  and have pt come in for a bp check in 2 weeks. Pt is aware and doesn't have any questions or concerns

## 2023-03-12 ENCOUNTER — Ambulatory Visit (HOSPITAL_BASED_OUTPATIENT_CLINIC_OR_DEPARTMENT_OTHER)
Admission: RE | Admit: 2023-03-12 | Discharge: 2023-03-12 | Disposition: A | Discharge status: Home / Self Care | Payer: 59 | Source: Ambulatory Visit | Attending: Urology | Admitting: Urology

## 2023-03-12 ENCOUNTER — Other Ambulatory Visit (HOSPITAL_BASED_OUTPATIENT_CLINIC_OR_DEPARTMENT_OTHER): Payer: Self-pay | Admitting: Urology

## 2023-03-12 ENCOUNTER — Ambulatory Visit (INDEPENDENT_AMBULATORY_CARE_PROVIDER_SITE_OTHER): Payer: 59 | Admitting: Urology

## 2023-03-12 ENCOUNTER — Encounter: Payer: Self-pay | Admitting: Urology

## 2023-03-12 ENCOUNTER — Telehealth: Payer: Self-pay | Admitting: Urology

## 2023-03-12 VITALS — BP 129/77 | HR 60 | Ht 70.0 in | Wt 180.0 lb

## 2023-03-12 DIAGNOSIS — R972 Elevated prostate specific antigen [PSA]: Secondary | ICD-10-CM | POA: Insufficient documentation

## 2023-03-12 DIAGNOSIS — Z2989 Encounter for other specified prophylactic measures: Secondary | ICD-10-CM

## 2023-03-12 DIAGNOSIS — C61 Malignant neoplasm of prostate: Secondary | ICD-10-CM

## 2023-03-12 LAB — URINALYSIS, ROUTINE W REFLEX MICROSCOPIC
Bilirubin, UA: NEGATIVE
Glucose, UA: NEGATIVE
Ketones, UA: NEGATIVE
Leukocytes,UA: NEGATIVE
Nitrite, UA: NEGATIVE
Protein,UA: NEGATIVE
RBC, UA: NEGATIVE
Specific Gravity, UA: 1.03 (ref 1.005–1.030)
Urobilinogen, Ur: 0.2 mg/dL (ref 0.2–1.0)
pH, UA: 6 (ref 5.0–7.5)

## 2023-03-12 MED ORDER — CEFTRIAXONE SODIUM 1 G IJ SOLR
1.0000 g | Freq: Once | INTRAMUSCULAR | Status: AC
Start: 2023-03-12 — End: 2023-03-12
  Administered 2023-03-12: 1 g via INTRAMUSCULAR

## 2023-03-12 NOTE — Telephone Encounter (Signed)
Pt reported that he hasn't eaten since last night. Advised pt to get something to eat and drink.

## 2023-03-12 NOTE — Progress Notes (Signed)
IM Injection  Patient is present today for an IM Injection for treatment of infection prevention post prostate biopsy Drug: Ceftriaxone Dose:1g Location:[facility]  Lot: [id number] Exp:10/2024 Patient tolerated well, no complications were noted  Performed by: Christal N., CMA(AAMA)

## 2023-03-12 NOTE — Progress Notes (Signed)
Assessment: 1. Elevated PSA     Plan: Per bx instructions FU 1 week to review path and options  Chief Complaint: Elevated psa  HPI: Jorge Walker is a 57 y.o. male who presents for scheduled trus/bx. He has completed standard prep and received 1 g of Rocephin prior to the procedure. Urinalysis was clear today.  Portions of the above documentation were copied from a prior visit for review purposes only.  Allergies: No Known Allergies  PMH: Past Medical History:  Diagnosis Date   Allergy    Arthritis    finger   BPH (benign prostatic hypertrophy) 2020   BPH with elevated PSA   Epididymal cyst    Tobacco use disorder     PSH: Past Surgical History:  Procedure Laterality Date   ARM WOUND REPAIR / CLOSURE     right upper posterior arm - teenager, s/p trauma   ARTERY REPAIR     right upper arm - teenager, s/p trauma to right upper arm   COLONOSCOPY  01/24/2016   Diverticulosis in left and right colon.   Repeat 10 years.  Dr. Yancey Flemings   ELBOW SURGERY  2019    SH: Social History   Tobacco Use   Smoking status: Every Day    Current packs/day: 0.50    Average packs/day: 0.5 packs/day for 17.0 years (8.5 ttl pk-yrs)    Types: Cigarettes   Smokeless tobacco: Never  Vaping Use   Vaping status: Never Used  Substance Use Topics   Alcohol use: Yes    Alcohol/week: 5.0 standard drinks of alcohol    Types: 5 Shots of liquor per week    Comment: weekends    Drug use: No    ROS: Constitutional:  Negative for fever, chills, weight loss CV: Negative for chest pain, previous MI, hypertension Respiratory:  Negative for shortness of breath, wheezing, sleep apnea, frequent cough GI:  Negative for nausea, vomiting, bloody stool, GERD  PE: BP 129/77   Pulse 60   Ht 5\' 10"  (1.778 m)   Wt 180 lb (81.6 kg)   BMI 25.83 kg/m  GENERAL APPEARANCE:  Well appearing, well developed, well nourished, NAD    Results: UA-clear  PROCEDURE:  TRANSRECTAL ULTRASOUND  AND PROSTATE BIOPSY  Indication:  Elevated PSA  Prophylactic antibiotic administration: Rocephin  All medications that could result in increased bleeding were discontinued within an appropriate period of the time of biopsy.  Risk including bleeding and infection were discussed.  Informed consent was obtained.  Preprocedural timeout was performed.  The patient was placed in the left lateral decubitus position.  DRE: Normal sphincter tone; prostate is large 50 to 60 g without evidence of nodules or induration  PROCEDURE 1.  TRANSRECTAL ULTRASOUND OF THE PROSTATE  The 7 MHz transrectal probe was used to image the prostate.  Anal stenosis was not noted.  TRUS volume: 60.1 ml  Hypoechoic areas: None  Hyperechoic areas: BPH NODULES IN TZ NOTED  Central calcifications: not present  Margins:  normal  Seminal Vesicles: somewhat collapsed   PROCEDURE 2:  PROSTATE BIOPSY  A periprostatic block was performed using 1% lidocaine and transrectal ultrasound guidance. Under transrectal ultrasound guidance, and using the Biopty gun, prostate biopsies were obtained systematically from the apex, mid gland, and base bilaterally.  A total of 12 cores were obtained.  Hemostasis was obtained with gentle pressure on the prostate.  The procedures were well-tolerated.  No significant bleeding was noted at the end of the procedure.  The patient was stable for discharge from the office.

## 2023-03-12 NOTE — Telephone Encounter (Signed)
Feeling nauseous  Wants to know if meds were given at visit or sent to pharmacy

## 2023-03-17 ENCOUNTER — Other Ambulatory Visit (INDEPENDENT_AMBULATORY_CARE_PROVIDER_SITE_OTHER): Payer: 59

## 2023-03-17 ENCOUNTER — Encounter: Payer: Self-pay | Admitting: Urology

## 2023-03-19 ENCOUNTER — Encounter: Payer: Self-pay | Admitting: Urology

## 2023-03-19 ENCOUNTER — Ambulatory Visit (INDEPENDENT_AMBULATORY_CARE_PROVIDER_SITE_OTHER): Payer: 59 | Admitting: Urology

## 2023-03-19 VITALS — BP 133/72 | HR 61

## 2023-03-19 DIAGNOSIS — R972 Elevated prostate specific antigen [PSA]: Secondary | ICD-10-CM | POA: Diagnosis not present

## 2023-03-19 DIAGNOSIS — C61 Malignant neoplasm of prostate: Secondary | ICD-10-CM

## 2023-03-19 LAB — URINALYSIS, ROUTINE W REFLEX MICROSCOPIC
Bilirubin, UA: NEGATIVE
Glucose, UA: NEGATIVE
Ketones, UA: NEGATIVE
Nitrite, UA: NEGATIVE
Protein,UA: NEGATIVE
Specific Gravity, UA: 1.02 (ref 1.005–1.030)
Urobilinogen, Ur: 0.2 mg/dL (ref 0.2–1.0)
pH, UA: 7.5 (ref 5.0–7.5)

## 2023-03-19 LAB — MICROSCOPIC EXAMINATION

## 2023-03-19 MED ORDER — TAMSULOSIN HCL 0.4 MG PO CAPS: 0.4000 mg | ORAL_CAPSULE | Freq: Every day | ORAL | 3 refills | Status: DC

## 2023-03-19 NOTE — Progress Notes (Addendum)
   Assessment: 1. Malignant neoplasm of prostate [facility])     Plan: Initially today, I discussed medical management options for the patient's lower urinary tract symptoms which are associated with significant bother.  He has previously taken tamsulosin and would like to restart this which I think is quite reasonable. Rx: Tamsulosin 0.4 mg daily Nature of medication including proper utilization as well as potential adverse events and side effects reviewed.  Today I also had a long detailed discussion with the patient regarding his recent diagnosis of low risk prostate cancer.  I discussed with him the natural history of his disease as well as options for further evaluation and treatment.  Given his low risk disease the patient is most interested in active surveillance. Given this, I have recommended further evaluation with-- Multiparametric prostate MRI in approximately 8 weeks to assess for any high-grade lesions Genomic testing with GPS  Patient will follow-up in 3 months to make further recommendations based on findings above.   ADDENDUM: GPS= 12 (ave for group= 24)  Chief Complaint: Prostate biopsy results  HPI: Jorge Walker is a 57 y.o. male who presents for continued evaluation of recently diagnosed prostate cancer.  Patient reports no complications as a result of his recent transrectal ultrasound and prostate biopsy.  Patient reports stable moderate LUTS.  IPSS= 8/4  Please see my note 02/25/2023 at the time of initial visit for detailed history and exam.  Prostate cancer summary: Initial diagnosis: TRUS/BX 02/2023 Pathology: Gleason 3+3 = 6 in 1 of 12 cores (left lateral apex) with 13% core involvement Prostate volume = 60 mL PSA 01/2023 = 5.8 Clinical stage T1c/DRE:  nst, prostate large 60gm with mild symmetry R greater than L.  NO focal induration or nodularity   Portions of the above documentation were copied from a prior visit for review purposes  only.  Allergies: No Known Allergies  PMH: Past Medical History:  Diagnosis Date   Allergy    Arthritis    finger   BPH (benign prostatic hypertrophy) 2020   BPH with elevated PSA   Epididymal cyst    Tobacco use disorder     PSH: Past Surgical History:  Procedure Laterality Date   ARM WOUND REPAIR / CLOSURE     right upper posterior arm - teenager, s/p trauma   ARTERY REPAIR     right upper arm - teenager, s/p trauma to right upper arm   COLONOSCOPY  01/24/2016   Diverticulosis in left and right colon.   Repeat 10 years.  Dr. Yancey Flemings   ELBOW SURGERY  2019    SH: Social History   Tobacco Use   Smoking status: Every Day    Current packs/day: 0.50    Average packs/day: 0.5 packs/day for 17.0 years (8.5 ttl pk-yrs)    Types: Cigarettes   Smokeless tobacco: Never  Vaping Use   Vaping status: Never Used  Substance Use Topics   Alcohol use: Yes    Alcohol/week: 5.0 standard drinks of alcohol    Types: 5 Shots of liquor per week    Comment: weekends    Drug use: No    ROS: Constitutional:  Negative for fever, chills, weight loss CV: Negative for chest pain, previous MI, hypertension Respiratory:  Negative for shortness of breath, wheezing, sleep apnea, frequent cough GI:  Negative for nausea, vomiting, bloody stool, GERD  PE: BP 133/72   Pulse 61  GENERAL APPEARANCE:  Well appearing, well developed, well nourished, NAD

## 2023-03-20 ENCOUNTER — Encounter (INDEPENDENT_AMBULATORY_CARE_PROVIDER_SITE_OTHER): Payer: Self-pay

## 2023-03-20 ENCOUNTER — Ambulatory Visit (INDEPENDENT_AMBULATORY_CARE_PROVIDER_SITE_OTHER): Payer: 59 | Admitting: Physician Assistant

## 2023-03-20 VITALS — BP 128/85 | HR 67 | Resp 16

## 2023-03-20 DIAGNOSIS — I10 Essential (primary) hypertension: Secondary | ICD-10-CM | POA: Diagnosis not present

## 2023-03-20 DIAGNOSIS — H5789 Other specified disorders of eye and adnexa: Secondary | ICD-10-CM

## 2023-03-20 MED ORDER — AMLODIPINE BESYLATE 10 MG PO TABS
10.0000 mg | ORAL_TABLET | Freq: Every day | ORAL | 1 refills | Status: DC
Start: 2023-03-20 — End: 2023-03-27

## 2023-03-20 MED ORDER — OLOPATADINE HCL 0.1 % OP SOLN
1.0000 [drp] | Freq: Two times a day (BID) | OPHTHALMIC | 12 refills | Status: DC
Start: 2023-03-20 — End: 2023-04-21

## 2023-03-20 NOTE — Progress Notes (Signed)
Patient ID: Jorge Walker, male   DOB: [birth year], 57 y.o.   MRN: [id number]   Jorge Walker, is a 57 y.o. male  [id number]  [id number]  DOB - 25-Sep-1965  Chief Complaint  Patient presents with   Allergic Reaction       Subjective:   Jorge Walker is a 57 y.o. male here today for red itchy eyes with eye redness R>L since he took amlodipine/olmesartan.  This was discontinued a few weeks ago due to this reaction but the redness has persisted and is irritated esp on R.  No drainage.  No vision changes.  No eye pain  No problems updated.  ALLERGIES: No Known Allergies  PAST MEDICAL HISTORY: Past Medical History:  Diagnosis Date   Allergy    Arthritis    finger   BPH (benign prostatic hypertrophy) 2020   BPH with elevated PSA   Epididymal cyst    Tobacco use disorder     MEDICATIONS AT HOME: Prior to Admission medications   Medication Sig Start Date End Date Taking? Authorizing Provider  olopatadine (PATANOL) 0.1 % ophthalmic solution Place 1 drop into both eyes 2 (two) times daily. 03/20/23  Yes Georgian Co M, PA-C  amLODipine (NORVASC) 10 MG tablet Take 1 tablet (10 mg total) by mouth daily. 03/20/23   Anders Simmonds, PA-C  tamsulosin (FLOMAX) 0.4 MG CAPS capsule Take 1 capsule (0.4 mg total) by mouth daily after supper. 03/19/23   Joline Maxcy, MD  Vitamin D, Ergocalciferol, (DRISDOL) 1.25 MG (50000 UNIT) CAPS capsule Take 1 capsule (50,000 Units total) by mouth every 7 (seven) days. 02/14/23   Grayce Sessions, NP    ROS: Neg resp Neg cardiac Neg GI Neg GU Neg MS Neg psych Neg neuro  Objective:   Vitals:   03/20/23 0944  BP: 128/85  Pulse: 67  Resp: 16  SpO2: 100%   Exam General appearance : Awake, alert, not in any distress. Speech Clear. Not toxic looking HEENT: Atraumatic and Normocephalic, pupils equally reactive to light and accomodation, EOM intact B.  B conjunctivae erythematous  with R> L but both are only mild to  moderate.  Outer canthus of R eye with chemosis Neck: Supple, no JVD. No cervical lymphadenopathy.  Chest: Good air entry bilaterally, CTAB.  No rales/rhonchi/wheezing CVS: S1 S2 regular, no murmurs.  Extremities: B/L Lower Ext shows no edema, both legs are warm to touch Neurology: Awake alert, and oriented X 3, CN II-XII intact, Non focal Skin: No Rash  Data Review Lab Results  Component Value Date   HGBA1C 5.6 01/02/2016    Assessment & Plan   1. Eye redness Appears allergic.  No red flags - olopatadine (PATANOL) 0.1 % ophthalmic solution; Place 1 drop into both eyes 2 (two) times daily.  Dispense: 5 mL; Refill: 12  2. Essential hypertension Controlled-continue - amLODipine (NORVASC) 10 MG tablet; Take 1 tablet (10 mg total) by mouth daily.  Dispense: 90 tablet; Refill: 1    Return for keep septmeber appt with Efrain Sella.  The patient was given clear instructions to go to ER or return to medical center if symptoms don't improve, worsen or new problems develop. The patient verbalized understanding. The patient was told to call to get lab results if they haven't heard anything in the next week.      Georgian Co, PA-C [facility] [place], [place] [phone]   03/20/2023, 9:56 AM

## 2023-03-25 ENCOUNTER — Ambulatory Visit (INDEPENDENT_AMBULATORY_CARE_PROVIDER_SITE_OTHER): Payer: 59

## 2023-03-27 ENCOUNTER — Ambulatory Visit (INDEPENDENT_AMBULATORY_CARE_PROVIDER_SITE_OTHER): Payer: 59

## 2023-03-27 ENCOUNTER — Other Ambulatory Visit (INDEPENDENT_AMBULATORY_CARE_PROVIDER_SITE_OTHER): Payer: Self-pay

## 2023-03-27 MED ORDER — OLMESARTAN MEDOXOMIL-HCTZ 20-12.5 MG PO TABS: 1.0000 | ORAL_TABLET | Freq: Every day | ORAL | 1 refills | Status: DC

## 2023-03-31 ENCOUNTER — Ambulatory Visit (INDEPENDENT_AMBULATORY_CARE_PROVIDER_SITE_OTHER): Payer: 59

## 2023-04-07 ENCOUNTER — Ambulatory Visit (INDEPENDENT_AMBULATORY_CARE_PROVIDER_SITE_OTHER): Payer: 59

## 2023-04-07 ENCOUNTER — Encounter: Payer: Self-pay | Admitting: Urology

## 2023-04-07 ENCOUNTER — Other Ambulatory Visit (INDEPENDENT_AMBULATORY_CARE_PROVIDER_SITE_OTHER): Payer: Self-pay | Admitting: Primary Care

## 2023-04-07 VITALS — BP 116/74

## 2023-04-07 DIAGNOSIS — Z0130 Encounter for examination of blood pressure without abnormal findings: Secondary | ICD-10-CM

## 2023-04-09 ENCOUNTER — Other Ambulatory Visit (INDEPENDENT_AMBULATORY_CARE_PROVIDER_SITE_OTHER): Payer: Self-pay | Admitting: Primary Care

## 2023-04-10 NOTE — Telephone Encounter (Signed)
Pt called saying [facility] wont accept his insurance . [facility] [address].

## 2023-04-21 ENCOUNTER — Other Ambulatory Visit (INDEPENDENT_AMBULATORY_CARE_PROVIDER_SITE_OTHER): Payer: Self-pay

## 2023-04-21 DIAGNOSIS — H5789 Other specified disorders of eye and adnexa: Secondary | ICD-10-CM

## 2023-04-21 MED ORDER — OLOPATADINE HCL 0.1 % OP SOLN: 1.0000 [drp] | Freq: Two times a day (BID) | OPHTHALMIC | 12 refills | Status: DC

## 2023-04-25 ENCOUNTER — Other Ambulatory Visit (INDEPENDENT_AMBULATORY_CARE_PROVIDER_SITE_OTHER): Payer: Self-pay | Admitting: Primary Care

## 2023-04-25 NOTE — Telephone Encounter (Signed)
Medication Refill - Medication: Vitamin D, Ergocalciferol, (DRISDOL) 1.25 MG (50000 UNIT) CAPS capsule   Has the patient contacted their pharmacy? No.  Preferred Pharmacy (with phone number or street name):  [id number] [place], [place] - [address] Phone: [phone]  Fax: [phone]     Has the patient been seen for an appointment in the last year OR does the patient have an upcoming appointment? Yes.    Agent: Please be advised that RX refills may take up to 3 business days. We ask that you follow-up with your pharmacy.

## 2023-04-25 NOTE — Telephone Encounter (Signed)
Requested medication (s) are due for refill today: yes  Requested medication (s) are on the active medication list: yes  Last refill:  02/14/23  Future visit scheduled: yes  Notes to clinic:   Manual Review: Route requests for 50,000 IU strength to the provider.     Requested Prescriptions  Pending Prescriptions Disp Refills   Vitamin D, Ergocalciferol, (DRISDOL) 1.25 MG (50000 UNIT) CAPS capsule 8 capsule 0    Sig: Take 1 capsule (50,000 Units total) by mouth every 7 (seven) days.     Endocrinology:  Vitamins - Vitamin D Supplementation 2 Failed - 04/25/2023  2:13 PM      Failed - Manual Review: Route requests for 50,000 IU strength to the provider      Failed - Vitamin D in normal range and within 360 days    Vit D, 25-Hydroxy  Date Value Ref Range Status  02/13/2023 17.4 (L) 30.0 - 100.0 ng/mL Final    Comment:    Vitamin D deficiency has been defined by the Institute of Medicine and an Endocrine Society practice guideline as a level of serum 25-OH vitamin D less than 20 ng/mL (1,2). The Endocrine Society went on to further define vitamin D insufficiency as a level between 21 and 29 ng/mL (2). 1. IOM (Institute of Medicine). 2010. Dietary reference    intakes for calcium and D. [place]: The    [employer]. 2. Holick MF, Binkley Rye, Bischoff-Ferrari HA, et al.    Evaluation, treatment, and prevention of vitamin D    deficiency: an Endocrine Society clinical practice    guideline. JCEM. 2011 Jul; 96(7):1911-30.          Passed - Ca in normal range and within 360 days    Calcium  Date Value Ref Range Status  02/13/2023 9.9 8.7 - 10.2 mg/dL Final         Passed - Valid encounter within last 12 months    Recent Outpatient Visits           1 month ago Eye redness   [place] [facility] [place], Marzella Schlein, PA-C   1 month ago Essential hypertension   [place] [facility] Grayce Sessions, NP   2 months ago  Encounter to establish care   [place] [facility] Grayce Sessions, NP       Future Appointments             In 3 weeks Randa Evens Kinnie Scales, NP [facility]   In 1 month Margo Aye, Duanne Guess, MD [facility]

## 2023-04-29 ENCOUNTER — Other Ambulatory Visit (INDEPENDENT_AMBULATORY_CARE_PROVIDER_SITE_OTHER): Payer: Self-pay | Admitting: Primary Care

## 2023-04-29 ENCOUNTER — Other Ambulatory Visit (INDEPENDENT_AMBULATORY_CARE_PROVIDER_SITE_OTHER): Payer: Self-pay

## 2023-04-29 NOTE — Telephone Encounter (Signed)
Medication Refill - Medication: amLODipine (NORVASC) 10 MG tablet   Has the patient contacted their pharmacy? Yes.   (Agent: If no, request that the patient contact the pharmacy for the refill. If patient does not wish to contact the pharmacy document the reason why and proceed with request.) (Agent: If yes, when and what did the pharmacy advise?)  Preferred Pharmacy (with phone number or street name):  [id number] [place], [place] - [address] [facility] [address]  [address] [place] [address]  Phone: [phone] Fax: [phone]   Has the patient been seen for an appointment in the last year OR does the patient have an upcoming appointment? Yes.    Agent: Please be advised that RX refills may take up to 3 business days. We ask that you follow-up with your pharmacy.

## 2023-05-01 NOTE — Telephone Encounter (Signed)
Requested medications are due for refill today.  unsure  Requested medications are on the active medications list.  no  Last refill. never  Future visit scheduled.   yes  Notes to clinic.  Please review for refill.    Requested Prescriptions

## 2023-05-01 NOTE — Telephone Encounter (Signed)
Returned pt call pt states this is an old message

## 2023-05-09 ENCOUNTER — Ambulatory Visit
Admission: RE | Admit: 2023-05-09 | Discharge: 2023-05-09 | Disposition: A | Discharge status: Home / Self Care | Payer: 59 | Source: Ambulatory Visit | Attending: Urology | Admitting: Urology

## 2023-05-09 DIAGNOSIS — C61 Malignant neoplasm of prostate: Secondary | ICD-10-CM | POA: Diagnosis not present

## 2023-05-09 MED ORDER — GADOPICLENOL 0.5 MMOL/ML IV SOLN
7.5000 mL | Freq: Once | INTRAVENOUS | Status: AC | PRN
  Administered 2023-05-09: 7.5 mL via INTRAVENOUS

## 2023-05-19 NOTE — Telephone Encounter (Signed)
Spoke to pt and he will be at apt on 9/24

## 2023-05-20 ENCOUNTER — Ambulatory Visit (INDEPENDENT_AMBULATORY_CARE_PROVIDER_SITE_OTHER): Payer: 59 | Admitting: Primary Care

## 2023-05-20 ENCOUNTER — Encounter (INDEPENDENT_AMBULATORY_CARE_PROVIDER_SITE_OTHER): Payer: Self-pay | Admitting: Primary Care

## 2023-05-20 VITALS — BP 129/85 | HR 62 | Resp 16 | Wt 182.8 lb

## 2023-05-20 DIAGNOSIS — E559 Vitamin D deficiency, unspecified: Secondary | ICD-10-CM | POA: Diagnosis not present

## 2023-05-20 DIAGNOSIS — D1720 Benign lipomatous neoplasm of skin and subcutaneous tissue of unspecified limb: Secondary | ICD-10-CM

## 2023-05-20 DIAGNOSIS — Z13220 Encounter for screening for lipoid disorders: Secondary | ICD-10-CM

## 2023-05-20 DIAGNOSIS — Z23 Encounter for immunization: Secondary | ICD-10-CM | POA: Diagnosis not present

## 2023-05-20 DIAGNOSIS — I10 Essential (primary) hypertension: Secondary | ICD-10-CM

## 2023-05-20 NOTE — Progress Notes (Signed)
[facility]  Jorge Walker, is a 57 y.o. male  WNU:[id number]  IHK:[id number]  DOB - [birth year]  Chief Complaint  Patient presents with   Hypertension   Cyst    right shoulder  Noticed 2-3 weeks ago        Subjective:   Mr.Jorge Walker is a 57 y.o. male here today for a follow up visit for HTN. Patient has No headache, No chest pain, No abdominal pain - No Nausea, No new weakness tingling or numbness, No Cough - shortness of breath.  He does complain of a" cyst" on his right shoulder that he noticed 2 to 3 weeks ago.  It only bothers him when he bumps against it or turns on it during his sleep.  He denies any falls, trauma, no known etiology.  No problems updated.  Allergies  Allergen Reactions   Amlodipine Swelling    ALL    Past Medical History:  Diagnosis Date   Allergy    Arthritis    finger   BPH (benign prostatic hypertrophy) 2020   BPH with elevated PSA   Epididymal cyst    Tobacco use disorder     Current Outpatient Medications on File Prior to Visit  Medication Sig Dispense Refill   olmesartan-hydrochlorothiazide (BENICAR HCT) 20-12.5 MG tablet Take 1 tablet by mouth daily. 90 tablet 1   olopatadine (PATANOL) 0.1 % ophthalmic solution Place 1 drop into both eyes 2 (two) times daily. 5 mL 12   tamsulosin (FLOMAX) 0.4 MG CAPS capsule Take 1 capsule (0.4 mg total) by mouth daily after supper. 90 capsule 3   Vitamin D, Ergocalciferol, (DRISDOL) 1.25 MG (50000 UNIT) CAPS capsule Take 1 capsule (50,000 Units total) by mouth every 7 (seven) days. (Patient not taking: Reported on 05/20/2023) 8 capsule 0   No current facility-administered medications on file prior to visit.    Objective:   Vitals:   05/20/23 0919  BP: 129/85  Pulse: 62  Resp: 16  SpO2: 100%  Weight: 182 lb 12.8 oz (82.9 kg)    Comprehensive ROS Pertinent positive and negative noted in HPI   Exam General appearance : Awake, alert, not in any distress. Speech Clear.  Not toxic looking HEENT: Atraumatic and Normocephalic, pupils equally reactive to light and accomodation Neck: Supple, no JVD. No cervical lymphadenopathy.  Chest: Good air entry bilaterally, no added sounds  CVS: S1 S2 regular, no murmurs.  Abdomen: Bowel sounds present, Non tender and not distended with no gaurding, rigidity or rebound. Extremities: B/L Lower Ext shows no edema, both legs are warm to touch Neurology: Awake alert, and oriented X 3, CN II-XII intact, Non focal Skin: See picture   data Review Lab Results  Component Value Date   HGBA1C 5.6 01/02/2016    Assessment & Plan  Izaiha was seen today for hypertension and cyst.  Diagnoses and all orders for this visit: Essential hypertension  Endorses taking medication daily Benicar HCTZ has improved blood pressure today 129/85 Explained that having normal blood pressure is the goal and medications are helping to get to goal and maintain normal blood pressure. DIET: Limit salt intake, read nutrition labels to check salt content, limit fried and high fatty foods  Avoid using multisymptom OTC cold preparations that generally contain sudafed which can rise BP. Consult with pharmacist on best cold relief products to use for persons with HTN EXERCISE Discussed incorporating exercise such as walking - 30 minutes most days of the week and can do in 10 minute  intervals    Vitamin D deficiency -     Vitamin D, 25-hydroxy  Screening cholesterol level -     Lipid Panel  Encounter for immunization -     Varicella-zoster vaccine IM  Lipoma of extremity  -     Ambulatory referral to General Surgery      Patient have been counseled extensively about nutrition and exercise. Other issues discussed during this visit include: low cholesterol diet, weight control and daily exercise, foot care, annual eye examinations at Ophthalmology, importance of adherence with medications and regular follow-up. We also discussed long term  complications of uncontrolled diabetes and hypertension.   Return in about 3 months (around 08/19/2023) for medical conditions.  The patient was given clear instructions to go to ER or return to medical center if symptoms don't improve, worsen or new problems develop. The patient verbalized understanding. The patient was told to call to get lab results if they haven't heard anything in the next week.   This note has been created with Education officer, environmental. Any transcriptional errors are unintentional.   Grayce Sessions, NP 05/20/2023, 8:00 PM

## 2023-05-20 NOTE — Patient Instructions (Signed)
Zoster Vaccine Injection What is this medication? ZOSTER VACCINE (ZOS ter vak SEEN) reduces the risk of herpes zoster (shingles). It does not treat shingles. It is still possible to get shingles after receiving the vaccine, but the symptoms may be less severe or not last as long. It works by helping your immune system learn how to fight off a future infection. This medicine may be used for other purposes; ask your health care provider or pharmacist if you have questions. COMMON BRAND NAME(S): [facility] What should I tell my care team before I take this medication? They need to know if you have any of these conditions: Cancer Immune system problems An unusual or allergic reaction to Zoster vaccine, other medications, foods, dyes, or preservatives Pregnant or trying to get pregnant Breastfeeding How should I use this medication? This vaccine is injected into a muscle. It is given by your care team. This vaccine requires 2 doses to get the full benefit. Set a reminder for when your next dose is due. A copy of Vaccine Information Statements will be given before each vaccination. Be sure to read this information carefully each time. This sheet may change often. Talk to your care team about the use of this vaccine in children. This vaccine is not approved for use in children. Overdosage: If you think you have taken too much of this medicine contact a poison control center or emergency room at once. NOTE: This medicine is only for you. Do not share this medicine with others. What if I miss a dose? Keep appointments for follow-up (booster) doses. It is important not to miss your dose. Call your care team if you are unable to keep an appointment. What may interact with this medication? Medications that suppress your immune system Medications to treat cancer Steroid medications, such as prednisone or cortisone This list may not describe all possible interactions. Give your health care provider a list  of all the medicines, herbs, non-prescription drugs, or dietary supplements you use. Also tell them if you smoke, drink alcohol, or use illegal drugs. Some items may interact with your medicine. What should I watch for while using this medication? Visit your care team regularly. This vaccine, like all vaccines, may not fully protect everyone. What side effects may I notice from receiving this medication? Side effects that you should report to your care team as soon as possible: Allergic reactions--skin rash, itching, hives, swelling of the face, lips, tongue, or throat Side effects that usually do not require medical attention (report these to your care team if they continue or are bothersome): Chills Fatigue Feeling faint or lightheaded Fever Headache Muscle pain Pain, redness, or irritation at injection site This list may not describe all possible side effects. Call your doctor for medical advice about side effects. You may report side effects to FDA at 1-800-FDA-1088. Where should I keep my medication? This vaccine is only given by your care team. It will not be stored at home. NOTE: This sheet is a summary. It may not cover all possible information. If you have questions about this medicine, talk to your doctor, pharmacist, or health care provider.  2024 Elsevier/Gold Standard (2022-01-31 00:00:00)

## 2023-05-21 ENCOUNTER — Other Ambulatory Visit (INDEPENDENT_AMBULATORY_CARE_PROVIDER_SITE_OTHER): Payer: Self-pay | Admitting: Primary Care

## 2023-05-21 DIAGNOSIS — E782 Mixed hyperlipidemia: Secondary | ICD-10-CM

## 2023-05-21 LAB — LIPID PANEL
Chol/HDL Ratio: 5.2 ratio — ABNORMAL HIGH (ref 0.0–5.0)
Cholesterol, Total: 172 mg/dL (ref 100–199)
HDL: 33 mg/dL — ABNORMAL LOW (ref >39)
LDL Chol Calc (NIH): 109 mg/dL — ABNORMAL HIGH (ref 0–99)
Triglycerides: 168 mg/dL — ABNORMAL HIGH (ref 0–149)
VLDL Cholesterol Cal: 30 mg/dL (ref 5–40)

## 2023-05-21 LAB — VITAMIN D 25 HYDROXY (VIT D DEFICIENCY, FRACTURES): Vit D, 25-Hydroxy: 46.2 ng/mL (ref 30.0–100.0)

## 2023-05-21 MED ORDER — ATORVASTATIN CALCIUM 40 MG PO TABS
40.0000 mg | ORAL_TABLET | Freq: Every day | ORAL | 3 refills | Status: DC
Start: 2023-05-21 — End: 2024-02-13

## 2023-05-29 ENCOUNTER — Telehealth: Payer: Self-pay | Admitting: Urology

## 2023-05-29 NOTE — Telephone Encounter (Signed)
Surgery. LVM to r/s

## 2023-06-02 ENCOUNTER — Telehealth: Payer: Self-pay | Admitting: Urology

## 2023-06-02 DIAGNOSIS — R2232 Localized swelling, mass and lump, left upper limb: Secondary | ICD-10-CM | POA: Diagnosis not present

## 2023-06-02 NOTE — Telephone Encounter (Signed)
Pt called and LVM to r/s called patient back last week and today but the phone keeps ringing and does not go to voicemail.

## 2023-06-09 ENCOUNTER — Encounter (HOSPITAL_BASED_OUTPATIENT_CLINIC_OR_DEPARTMENT_OTHER): Payer: Self-pay | Admitting: General Surgery

## 2023-06-10 ENCOUNTER — Ambulatory Visit: Payer: 59 | Admitting: Urology

## 2023-06-12 ENCOUNTER — Encounter (HOSPITAL_BASED_OUTPATIENT_CLINIC_OR_DEPARTMENT_OTHER)
Admission: RE | Admit: 2023-06-12 | Discharge: 2023-06-12 | Disposition: A | Discharge status: Home / Self Care | Payer: 59 | Source: Ambulatory Visit | Attending: General Surgery | Admitting: General Surgery

## 2023-06-12 ENCOUNTER — Ambulatory Visit (INDEPENDENT_AMBULATORY_CARE_PROVIDER_SITE_OTHER): Payer: 59 | Admitting: Urology

## 2023-06-12 ENCOUNTER — Encounter: Payer: Self-pay | Admitting: Urology

## 2023-06-12 VITALS — BP 122/83 | HR 55 | Ht 69.5 in | Wt 180.0 lb

## 2023-06-12 DIAGNOSIS — Z01812 Encounter for preprocedural laboratory examination: Secondary | ICD-10-CM | POA: Insufficient documentation

## 2023-06-12 DIAGNOSIS — C61 Malignant neoplasm of prostate: Secondary | ICD-10-CM

## 2023-06-12 LAB — BASIC METABOLIC PANEL
Anion gap: 7 (ref 5–15)
BUN: 15 mg/dL (ref 6–20)
CO2: 25 mmol/L (ref 22–32)
Calcium: 10.1 mg/dL (ref 8.9–10.3)
Chloride: 104 mmol/L (ref 98–111)
Creatinine, Ser: 1.16 mg/dL (ref 0.61–1.24)
GFR, Estimated: >60 mL/min (ref >60)
Glucose, Bld: 94 mg/dL (ref 70–99)
Potassium: 4.4 mmol/L (ref 3.5–5.1)
Sodium: 136 mmol/L (ref 135–145)

## 2023-06-12 NOTE — Progress Notes (Addendum)
   Assessment: 1. Malignant neoplasm of prostate [facility])     Plan: Today had a long discussion with the patient again regarding his low risk prostate cancer including its natural history and standard treatment options.  The patient is very clear that he wants to pursue an active surveillance approach.  I discussed the logistics of this including follow-up testing including repeat biopsy next year.  He is agreeable. Follow-up 4 months-PSA on follow-up PSA today  Consider 5ari given bph/large gland  Chief Complaint: Chief Complaint  Patient presents with   Prostate Cancer    HPI: Jorge Walker is a 57 y.o. male who presents for continued evaluation of low risk prostate cancer.  Today I went over the results of his additional testing including GPS genomic testing and multiparametric prostate MRI with him in great detail.  Please see my note 02/25/2023 at the time of initial visit for detailed history and exam.   Prostate cancer summary: Initial diagnosis: TRUS/BX 02/2023 Pathology: Gleason 3+3 = 6 in 1 of 12 cores (left lateral apex) with 13% core involvement Prostate volume = 60 mL PSA 01/2023 = 5.8 Clinical stage T1c/DRE:  nst, prostate large 60gm with mild symmetry R greater than L.  NO focal induration or nodularity  GPS= 12 (ave for group= 24)  Multiparametric prostate MRI 05/09/2023-- IMPRESSION: 1. Two PI-RADS category 3 lesions are identified in the peripheral zone. Targeting data sent to [facility]. 2. Prostatomegaly and benign prostatic hypertrophy. 3. Central pelvic kidney. 4.  Prostate Volume= 87.4   Portions of the above documentation were copied from a prior visit for review purposes only.  Allergies: Allergies  Allergen Reactions   Amlodipine Swelling    ALL    PMH: Past Medical History:  Diagnosis Date   Allergy    Arthritis    finger   BPH (benign prostatic hypertrophy) 2020   BPH with elevated PSA   Epididymal cyst    Hypertension    Tobacco use  disorder     PSH: Past Surgical History:  Procedure Laterality Date   ARM WOUND REPAIR / CLOSURE     right upper posterior arm - teenager, s/p trauma   ARTERY REPAIR     right upper arm - teenager, s/p trauma to right upper arm   COLONOSCOPY  01/24/2016   Diverticulosis in left and right colon.   Repeat 10 years.  Dr. Yancey Flemings   ELBOW SURGERY  2019    SH: Social History   Tobacco Use   Smoking status: Every Day    Current packs/day: 0.50    Average packs/day: 0.5 packs/day for 17.0 years (8.5 ttl pk-yrs)    Types: Cigarettes   Smokeless tobacco: Never  Vaping Use   Vaping status: Never Used  Substance Use Topics   Alcohol use: Yes    Alcohol/week: 5.0 standard drinks of alcohol    Types: 5 Shots of liquor per week    Comment: weekends    Drug use: No    ROS: Constitutional:  Negative for fever, chills, weight loss CV: Negative for chest pain, previous MI, hypertension Respiratory:  Negative for shortness of breath, wheezing, sleep apnea, frequent cough GI:  Negative for nausea, vomiting, bloody stool, GERD  PE: BP 122/83   Pulse (!) 55   Ht 5' 9.5" (1.765 m)   Wt 180 lb (81.6 kg)   BMI 26.20 kg/m  GENERAL APPEARANCE:  Well appearing, well developed, well nourished, NAD

## 2023-06-13 ENCOUNTER — Ambulatory Visit: Payer: Self-pay | Admitting: General Surgery

## 2023-06-13 LAB — PSA: Prostate Specific Ag, Serum: 6.6 ng/mL — ABNORMAL HIGH (ref 0.0–4.0)

## 2023-06-16 NOTE — Plan of Care (Signed)
[facility] Tonsillectomy/Adenoidectomy, Postoperative PEDS care plan entered in error.

## 2023-06-17 ENCOUNTER — Telehealth: Payer: Self-pay | Admitting: Urology

## 2023-06-17 ENCOUNTER — Ambulatory Visit (HOSPITAL_BASED_OUTPATIENT_CLINIC_OR_DEPARTMENT_OTHER): Payer: 59 | Admitting: Certified Registered"

## 2023-06-17 ENCOUNTER — Other Ambulatory Visit: Payer: Self-pay

## 2023-06-17 ENCOUNTER — Encounter (HOSPITAL_BASED_OUTPATIENT_CLINIC_OR_DEPARTMENT_OTHER)
Admission: RE | Disposition: A | Discharge status: Home / Self Care | Payer: Self-pay | Source: Home / Self Care | Attending: General Surgery

## 2023-06-17 ENCOUNTER — Encounter (HOSPITAL_BASED_OUTPATIENT_CLINIC_OR_DEPARTMENT_OTHER): Payer: Self-pay | Admitting: General Surgery

## 2023-06-17 ENCOUNTER — Ambulatory Visit (HOSPITAL_BASED_OUTPATIENT_CLINIC_OR_DEPARTMENT_OTHER)
Admission: RE | Admit: 2023-06-17 | Discharge: 2023-06-17 | Disposition: A | Discharge status: Home / Self Care | Payer: 59 | Attending: General Surgery | Admitting: General Surgery

## 2023-06-17 DIAGNOSIS — D1722 Benign lipomatous neoplasm of skin and subcutaneous tissue of left arm: Secondary | ICD-10-CM | POA: Diagnosis not present

## 2023-06-17 DIAGNOSIS — I10 Essential (primary) hypertension: Secondary | ICD-10-CM | POA: Insufficient documentation

## 2023-06-17 DIAGNOSIS — F17210 Nicotine dependence, cigarettes, uncomplicated: Secondary | ICD-10-CM | POA: Insufficient documentation

## 2023-06-17 DIAGNOSIS — Z79899 Other long term (current) drug therapy: Secondary | ICD-10-CM | POA: Diagnosis not present

## 2023-06-17 DIAGNOSIS — R2232 Localized swelling, mass and lump, left upper limb: Secondary | ICD-10-CM | POA: Diagnosis not present

## 2023-06-17 DIAGNOSIS — L988 Other specified disorders of the skin and subcutaneous tissue: Secondary | ICD-10-CM | POA: Diagnosis not present

## 2023-06-17 HISTORY — DX: Essential (primary) hypertension: I10

## 2023-06-17 SURGERY — EXCISION, MASS, UPPER EXTREMITY
Anesthesia: Monitor Anesthesia Care | Site: Shoulder | Laterality: Left

## 2023-06-17 MED ORDER — SODIUM CHLORIDE 0.9% FLUSH: 3.0000 mL | INTRAVENOUS | Status: DC | PRN

## 2023-06-17 MED ORDER — CEFAZOLIN SODIUM-DEXTROSE 2-4 GM/100ML-% IV SOLN
2.0000 g | INTRAVENOUS | Status: AC
  Administered 2023-06-17: 2 g via INTRAVENOUS

## 2023-06-17 MED ORDER — ACETAMINOPHEN 325 MG PO TABS
650.0000 mg | ORAL_TABLET | Freq: Four times a day (QID) | ORAL | 0 refills | Status: AC
Start: 2023-06-17 — End: 2023-06-23

## 2023-06-17 MED ORDER — OXYCODONE HCL 5 MG PO TABS: 5.0000 mg | ORAL_TABLET | ORAL | Status: DC | PRN

## 2023-06-17 MED ORDER — SODIUM CHLORIDE 0.9% FLUSH: 3.0000 mL | Freq: Two times a day (BID) | INTRAVENOUS | Status: DC

## 2023-06-17 MED ORDER — ACETAMINOPHEN 325 MG RE SUPP: 650.0000 mg | RECTAL | Status: DC | PRN

## 2023-06-17 MED ORDER — KETOROLAC TROMETHAMINE 30 MG/ML IJ SOLN: 30.0000 mg | Freq: Once | INTRAMUSCULAR | Status: DC | PRN

## 2023-06-17 MED ORDER — PROPOFOL 10 MG/ML IV BOLUS
INTRAVENOUS | Status: DC | PRN
  Administered 2023-06-17 (×2): 20 mg via INTRAVENOUS

## 2023-06-17 MED ORDER — PROPOFOL 500 MG/50ML IV EMUL
INTRAVENOUS | Status: DC | PRN
  Administered 2023-06-17: 50 ug/kg/min via INTRAVENOUS

## 2023-06-17 MED ORDER — OXYCODONE HCL 5 MG PO TABS: 5.0000 mg | ORAL_TABLET | Freq: Once | ORAL | Status: DC | PRN

## 2023-06-17 MED ORDER — OXYCODONE HCL 5 MG/5ML PO SOLN: 5.0000 mg | Freq: Once | ORAL | Status: DC | PRN

## 2023-06-17 MED ORDER — BUPIVACAINE-EPINEPHRINE (PF) 0.25% -1:200000 IJ SOLN
INTRAMUSCULAR | Status: AC
  Filled 2023-06-17: qty 30

## 2023-06-17 MED ORDER — CHLORHEXIDINE GLUCONATE CLOTH 2 % EX PADS: 6.0000 | MEDICATED_PAD | Freq: Once | CUTANEOUS | Status: DC

## 2023-06-17 MED ORDER — MIDAZOLAM HCL 2 MG/2ML IJ SOLN
INTRAMUSCULAR | Status: DC | PRN
  Administered 2023-06-17: 2 mg via INTRAVENOUS

## 2023-06-17 MED ORDER — OXYCODONE HCL 5 MG PO TABS: 5.0000 mg | ORAL_TABLET | Freq: Three times a day (TID) | ORAL | 0 refills | Status: AC | PRN

## 2023-06-17 MED ORDER — DEXAMETHASONE SODIUM PHOSPHATE 10 MG/ML IJ SOLN
INTRAMUSCULAR | Status: AC
  Filled 2023-06-17: qty 1

## 2023-06-17 MED ORDER — LACTATED RINGERS IV SOLN: INTRAVENOUS | Status: DC

## 2023-06-17 MED ORDER — ACETAMINOPHEN 325 MG PO TABS: 325.0000 mg | ORAL_TABLET | ORAL | Status: DC | PRN

## 2023-06-17 MED ORDER — ONDANSETRON HCL 4 MG/2ML IJ SOLN
INTRAMUSCULAR | Status: AC
  Filled 2023-06-17: qty 2

## 2023-06-17 MED ORDER — CEFAZOLIN SODIUM-DEXTROSE 2-4 GM/100ML-% IV SOLN
INTRAVENOUS | Status: AC
  Filled 2023-06-17: qty 100

## 2023-06-17 MED ORDER — PROPOFOL 10 MG/ML IV BOLUS
INTRAVENOUS | Status: AC
  Filled 2023-06-17: qty 20

## 2023-06-17 MED ORDER — ACETAMINOPHEN 325 MG PO TABS: 650.0000 mg | ORAL_TABLET | ORAL | Status: DC | PRN

## 2023-06-17 MED ORDER — FENTANYL CITRATE (PF) 100 MCG/2ML IJ SOLN
INTRAMUSCULAR | Status: AC
  Filled 2023-06-17: qty 2

## 2023-06-17 MED ORDER — BUPIVACAINE-EPINEPHRINE 0.25% -1:200000 IJ SOLN
INTRAMUSCULAR | Status: DC | PRN
  Administered 2023-06-17: 15 mL

## 2023-06-17 MED ORDER — MIDAZOLAM HCL 2 MG/2ML IJ SOLN
INTRAMUSCULAR | Status: AC
  Filled 2023-06-17: qty 2

## 2023-06-17 MED ORDER — FENTANYL CITRATE (PF) 100 MCG/2ML IJ SOLN
INTRAMUSCULAR | Status: DC | PRN
  Administered 2023-06-17: 50 ug via INTRAVENOUS

## 2023-06-17 MED ORDER — MEPERIDINE HCL 25 MG/ML IJ SOLN
6.2500 mg | INTRAMUSCULAR | Status: DC | PRN
Start: 2023-06-17 — End: 2023-06-17

## 2023-06-17 MED ORDER — IBUPROFEN 200 MG PO TABS: 600.0000 mg | ORAL_TABLET | Freq: Four times a day (QID) | ORAL | 0 refills | Status: AC

## 2023-06-17 MED ORDER — ACETAMINOPHEN 160 MG/5ML PO SOLN: 325.0000 mg | ORAL | Status: DC | PRN

## 2023-06-17 MED ORDER — SODIUM CHLORIDE 0.9% FLUSH: 10.0000 mL | Freq: Two times a day (BID) | INTRAVENOUS | Status: DC

## 2023-06-17 MED ORDER — LIDOCAINE 2% (20 MG/ML) 5 ML SYRINGE
INTRAMUSCULAR | Status: AC
  Filled 2023-06-17: qty 5

## 2023-06-17 MED ORDER — ONDANSETRON HCL 4 MG/2ML IJ SOLN: 4.0000 mg | Freq: Once | INTRAMUSCULAR | Status: DC | PRN

## 2023-06-17 MED ORDER — FENTANYL CITRATE (PF) 100 MCG/2ML IJ SOLN: 25.0000 ug | INTRAMUSCULAR | Status: DC | PRN

## 2023-06-17 MED ORDER — ONDANSETRON HCL 4 MG/2ML IJ SOLN
INTRAMUSCULAR | Status: DC | PRN
  Administered 2023-06-17: 4 mg via INTRAVENOUS

## 2023-06-17 SURGICAL SUPPLY — 47 items
ADH SKN CLS APL DERMABOND .7 (GAUZE/BANDAGES/DRESSINGS) ×1
APL PRP STRL LF DISP 70% ISPRP (MISCELLANEOUS) ×1
BLADE CLIPPER SURG (BLADE) IMPLANT
BLADE SURG 15 STRL LF DISP TIS (BLADE) ×2 IMPLANT
BLADE SURG 15 STRL SS (BLADE) ×1
CANISTER SUCT 1200ML W/VALVE (MISCELLANEOUS) ×2 IMPLANT
CHLORAPREP W/TINT 26 (MISCELLANEOUS) ×2 IMPLANT
COVER BACK TABLE 60X90IN (DRAPES) ×2 IMPLANT
COVER MAYO STAND STRL (DRAPES) ×2 IMPLANT
DERMABOND ADVANCED .7 DNX12 (GAUZE/BANDAGES/DRESSINGS) ×2 IMPLANT
DRAPE LAPAROSCOPIC ABDOMINAL (DRAPES) IMPLANT
DRAPE LAPAROTOMY 100X72 PEDS (DRAPES) IMPLANT
DRAPE UTILITY XL STRL (DRAPES) ×2 IMPLANT
DRSG TEGADERM 4X4.75 (GAUZE/BANDAGES/DRESSINGS) IMPLANT
ELECT COATED BLADE 2.86 ST (ELECTRODE) ×2 IMPLANT
ELECT REM PT RETURN 9FT ADLT (ELECTROSURGICAL) ×1
ELECTRODE REM PT RTRN 9FT ADLT (ELECTROSURGICAL) ×2 IMPLANT
GAUZE PACKING IODOFORM 1/4X15 (PACKING) IMPLANT
GAUZE SPONGE 4X4 12PLY STRL LF (GAUZE/BANDAGES/DRESSINGS) IMPLANT
GLOVE BIO SURGEON STRL SZ7 (GLOVE) ×2 IMPLANT
GLOVE BIOGEL PI IND STRL 7.5 (GLOVE) ×2 IMPLANT
GOWN STRL REUS W/ TWL LRG LVL3 (GOWN DISPOSABLE) ×2 IMPLANT
GOWN STRL REUS W/ TWL XL LVL3 (GOWN DISPOSABLE) ×2 IMPLANT
GOWN STRL REUS W/TWL LRG LVL3 (GOWN DISPOSABLE) ×1
GOWN STRL REUS W/TWL XL LVL3 (GOWN DISPOSABLE) ×1
NDL HYPO 25X1 1.5 SAFETY (NEEDLE) ×2 IMPLANT
NEEDLE HYPO 25X1 1.5 SAFETY (NEEDLE) ×1
NS IRRIG 1000ML POUR BTL (IV SOLUTION) IMPLANT
PACK BASIN DAY SURGERY FS (CUSTOM PROCEDURE TRAY) ×2 IMPLANT
PENCIL SMOKE EVACUATOR (MISCELLANEOUS) ×2 IMPLANT
SPIKE FLUID TRANSFER (MISCELLANEOUS) IMPLANT
SPONGE T-LAP 18X18 ~~LOC~~+RFID (SPONGE) ×2 IMPLANT
SUT ETHILON 2 0 FS 18 (SUTURE) IMPLANT
SUT MNCRL AB 4-0 PS2 18 (SUTURE) ×2 IMPLANT
SUT SILK 2 0 SH (SUTURE) IMPLANT
SUT VIC AB 2-0 SH 18 (SUTURE) IMPLANT
SUT VIC AB 2-0 SH 27 (SUTURE)
SUT VIC AB 2-0 SH 27XBRD (SUTURE) IMPLANT
SUT VIC AB 4-0 PS2 18 (SUTURE) IMPLANT
SUT VICRYL 3-0 CR8 SH (SUTURE) ×2 IMPLANT
SWAB COLLECTION DEVICE MRSA (MISCELLANEOUS) IMPLANT
SWAB CULTURE ESWAB REG 1ML (MISCELLANEOUS) IMPLANT
SYR CONTROL 10ML LL (SYRINGE) ×2 IMPLANT
TOWEL GREEN STERILE FF (TOWEL DISPOSABLE) ×2 IMPLANT
TUBE CONNECTING 20X1/4 (TUBING) ×2 IMPLANT
UNDERPAD 30X36 HEAVY ABSORB (UNDERPADS AND DIAPERS) IMPLANT
YANKAUER SUCT BULB TIP NO VENT (SUCTIONS) ×2 IMPLANT

## 2023-06-17 NOTE — Op Note (Signed)
Preoperative diagnosis: Left arm lesion  Postoperative diagnosis: same   Procedure: Excision of left arm lesion (3cm x 2cm)  Surgeon: Melody Haver, M.D.  Anesthesia: LMA  Indications for procedure: Jorge Walker is a 57 y.o. year old male with a left arm lesion that has become increasingly painful.  I offered excision in the operating room, and following a discussion of the risks and benefits written consent was obtained.   Description of procedure: In the pre-operative area and prior to the administration of any sedating medications, the lesion in the left arm was identified and marked after agreeing with the patient that this was the area of interest. The patient was then brought into the operative suite. Anesthesia was administered with General LMA anesthesia. WHO checklist was applied. The patient was then placed in supine position. The area was prepped and draped in the usual sterile fashion.  The previously marked area on the left arm was identified. Local anesthetic was instilled. The skin overlying the lesion was incised with a #15 blade. The subcutaneous tissue was dissected using electrocautery and the lesion was identified.  It was multi-lobulated lipomatous lesion measuring 3cm x 2cm.  Using a combination of blunt dissection and electrocautery, the lesion was excised in its entirety. The cavity was irrigated with sterile saline.  The cavity was hemostatic. The wound was closed using deep dermal 3-0 interrupted vicryl and a running 4-0 monocryl for the dermis.  Dermabond was applied.    He tolerated the procedure well and was transported to PACU in stable condition.  Sponge and instrument counts were reported as correct.   Findings: lipomatous lesion  Specimen: left arm lesion for permanent   Blood loss: minimal   Local anesthesia:  15 ml marcaine   Complications: None  Melody Haver, M.D. General Surgery [facility], [place]

## 2023-06-17 NOTE — H&P (Signed)
Jorge Walker Jan 21, 1966  [id number].    HPI:  57 y/o M who presents for elective removal of a left arm lesion.  He is in his usual state of health and denies any recent hospitalizations or changes in medication.   ROS: Review of Systems  Constitutional: Negative.   HENT: Negative.    Eyes: Negative.   Respiratory: Negative.    Cardiovascular: Negative.   Gastrointestinal: Negative.   Genitourinary: Negative.   Musculoskeletal: Negative.   Skin: Negative.   Neurological: Negative.   Endo/Heme/Allergies: Negative.   Psychiatric/Behavioral: Negative.      Family History  Problem Relation Age of Onset   Hypertension Mother    Diabetes Mother    Pneumonia Father        died of pneumonia   Cancer Neg Hx    Heart disease Neg Hx    Stroke Neg Hx    Colon cancer Neg Hx    Colon polyps Neg Hx    Esophageal cancer Neg Hx    Rectal cancer Neg Hx    Stomach cancer Neg Hx     Past Medical History:  Diagnosis Date   Allergy    Arthritis    finger   BPH (benign prostatic hypertrophy) 2020   BPH with elevated PSA   Epididymal cyst    Hypertension    Tobacco use disorder     Past Surgical History:  Procedure Laterality Date   ARM WOUND REPAIR / CLOSURE     right upper posterior arm - teenager, s/p trauma   ARTERY REPAIR     right upper arm - teenager, s/p trauma to right upper arm   COLONOSCOPY  01/24/2016   Diverticulosis in left and right colon.   Repeat 10 years.  Dr. Yancey Flemings   ELBOW SURGERY  2019    Social History:  reports that he has been smoking cigarettes. He has a 8.5 pack-year smoking history. He has never used smokeless tobacco. He reports current alcohol use of about 5.0 standard drinks of alcohol per week. He reports that he does not use drugs.  Allergies:  Allergies  Allergen Reactions   Amlodipine Swelling    ALL    Medications Prior to Admission  Medication Sig Dispense Refill   atorvastatin (LIPITOR) 40 MG tablet Take 1 tablet (40  mg total) by mouth daily. 90 tablet 3   olmesartan-hydrochlorothiazide (BENICAR HCT) 20-12.5 MG tablet Take 1 tablet by mouth daily. 90 tablet 1   olopatadine (PATANOL) 0.1 % ophthalmic solution Place 1 drop into both eyes 2 (two) times daily. 5 mL 12   tamsulosin (FLOMAX) 0.4 MG CAPS capsule Take 1 capsule (0.4 mg total) by mouth daily after supper. 90 capsule 3   Vitamin D, Ergocalciferol, (DRISDOL) 1.25 MG (50000 UNIT) CAPS capsule Take 1 capsule (50,000 Units total) by mouth every 7 (seven) days. 8 capsule 0    Physical Exam: Blood pressure 119/84, pulse 60, temperature 98.4 F (36.9 C), temperature source Oral, resp. rate 14, height 5\' 10"  (1.778 m), weight 81 kg, SpO2 100%. Gen: male resting in bed Extremities: Palpable lesion on the left upper extremity.  The area in question was marked after being agreed upon by the patient as the area in question   No results found for this or any previous visit (from the past 48 hour(s)). No results found.  Assessment/Plan 57 y/o M w/ a symptomatic left arm lesion who presents for elective removal  - Will proceed to  OR as planned - Tentative plan for discharge from PACU  I reviewed last 24 h vitals and pain scores, last 24 h labs and trends, and last 24 h imaging results.  Tacy Learn Surgery 06/17/2023, 3:01 PM Please see Amion for pager number during day hours 7:00am-4:30pm or 7:00am -11:30am on weekends

## 2023-06-17 NOTE — Anesthesia Postprocedure Evaluation (Signed)
Anesthesia Post Note  Patient: Jorge Walker  Procedure(s) Performed: EXCISION LEFT ARM CYST (Left: Shoulder)     Patient location during evaluation: Phase II Anesthesia Type: MAC Level of consciousness: awake Pain management: pain level controlled Vital Signs Assessment: post-procedure vital signs reviewed and stable Respiratory status: spontaneous breathing Cardiovascular status: stable Postop Assessment: no apparent nausea or vomiting Anesthetic complications: no  No notable events documented.  Last Vitals:  Vitals:   06/17/23 1619 06/17/23 1630  BP: 120/61 139/66  Pulse: 69 62  Resp: 18 14  Temp: 36.4 C   SpO2: 100% 100%    Last Pain:  Vitals:   06/17/23 1630  TempSrc:   PainSc: 0-No pain                 Caren Macadam

## 2023-06-17 NOTE — Transfer of Care (Signed)
Immediate Anesthesia Transfer of Care Note  Patient: Jorge Walker  Procedure(s) Performed: EXCISION LEFT ARM CYST (Left: Shoulder)  Patient Location: PACU  Anesthesia Type:MAC  Level of Consciousness: drowsy  Airway & Oxygen Therapy: Patient Spontanous Breathing and Patient connected to face mask oxygen  Post-op Assessment: Report given to RN and Post -op Vital signs reviewed and stable  Post vital signs: Reviewed and stable  Last Vitals:  Vitals Value Taken Time  BP 120/61 (77)   Temp    Pulse 71   Resp 19   SpO2 100     Last Pain:  Vitals:   06/17/23 1326  TempSrc: Oral  PainSc: 0-No pain         Complications: No notable events documented.

## 2023-06-17 NOTE — Telephone Encounter (Signed)
I let pt know that Dr Margo Aye was on Vacation. But still wanted to reach out to other doctor here to see what to do. He would like to know his options and what he would be able to do for his cancer. Of he can have some surgery to remove it.

## 2023-06-17 NOTE — Anesthesia Preprocedure Evaluation (Signed)
Anesthesia Evaluation  Patient identified by MRN, date of birth, ID band Patient awake    Reviewed: Allergy & Precautions, NPO status , Patient's Chart, lab work & pertinent test results  Airway Mallampati: I       Dental no notable dental hx.    Pulmonary Current Smoker and Patient abstained from smoking.   Pulmonary exam normal        Cardiovascular hypertension, Pt. on medications Normal cardiovascular exam     Neuro/Psych    GI/Hepatic negative GI ROS, Neg liver ROS,,,  Endo/Other  negative endocrine ROS    Renal/GU negative Renal ROS  negative genitourinary   Musculoskeletal   Abdominal Normal abdominal exam  (+)   Peds  Hematology   Anesthesia Other Findings   Reproductive/Obstetrics                             Anesthesia Physical Anesthesia Plan  ASA: 2  Anesthesia Plan: MAC   Post-op Pain Management:    Induction: Intravenous  PONV Risk Score and Plan: 0 and Ondansetron, Dexamethasone, Midazolam, Propofol infusion and TIVA  Airway Management Planned: Natural Airway and Simple Face Mask  Additional Equipment: None  Intra-op Plan:   Post-operative Plan:   Informed Consent: I have reviewed the patients History and Physical, chart, labs and discussed the procedure including the risks, benefits and alternatives for the proposed anesthesia with the patient or authorized representative who has indicated his/her understanding and acceptance.       Plan Discussed with: CRNA  Anesthesia Plan Comments:        Anesthesia Quick Evaluation

## 2023-06-17 NOTE — Discharge Instructions (Addendum)
Outpatient Surgery Home Care Instruction  Activity  The effects of anesthesia are still present and drowsiness may result.  Limit activity for the first 24 hours, then you may return to normal daily activities. Returning to normal daily activities as soon as you can following surgery will enhance recovery time.  Do not drive or operate heavy machinery within 24 hours of taking narcotic pain medications.   Do not mow the lawn, use a vacuum cleaner, or do any other strenuous activities without first consulting your surgical team.   Diet Drink plenty of fluids and eat light meals today, then resume regular diet. Some patients may find their appetite is poor for a week or two after surgery. This is a normal result of the stress of surgery-your appetite will return in time.   There are no specific diet restrictions after surgery.   Dressing and Wound Care  Keep your wound or incision site clean and dry.  You may have different types of dressings covering your incisions depending on your operation and your surgeon: o Dermabond/Durabond (skin glue): This will usually remain in place for 10-14 days, then naturally fall off your skin. You may take a shower 24 hrs after surgery, carefully wash, not scrub the incision site with a mild non-scented soap. Pat dry with a soft towel.  Do not pick or peel skin glue off.  Do not use creams, powder, salves or balms on your incision(s).       What to Expect After Surgery   Moderate discomfort controlled with medications  Minimal drainage from incision  Feeling fatigue and weak  Constipation after surgery is common. Drink plenty fluids and eat a high fiber diet.   Pain Control: Prescribed Non-Narcotic Pain Medication  You will be given three prescriptions.  Two of them will be for prescription strength ibuprofen (i.e. Advil) and prescription strength acetaminophen (i.e. Tylenol).  The vast majority of patients will just need these two medications.  One  prescription will be for a 'rescue' prescription of an oral narcotic (oxycodone).  You may fill this if needed.  You will alternate taking the ibuprofen (600mg ) every 6 hours and also the acetaminophen (650mg ) every 6 hours so that you are taking one of those medications every 3 hours.  For example: o 0800 - take ibuprofen 600mg  o 1100 - take acetaminophen 650mg  o 1400 - take ibuprofen 600mg  o 1700 - take acetaminophen 650mg  o Etc.  Continue taking this alternating pattern of ibuprofen and acetaminophen for 3 days  If you cannot take one or the other of these medications, just take the one you can every 6 hours.  If you are comfortable at night, you don't have to wake up and take a medication.  If you are still uncomfortable after taking either ibuprofen or acetaminophen, try gentle stretching exercise and ice packs (a bag of frozen vegetables works great).  If you are still uncomfortable, you may fill the narcotic prescription of Oxycodone and take as directed.  Once you have completed these prescriptions, your pain level should be low enough to stop taking medications altogether or just use an over the counter medication (ibuprofen or acetaminophen) as needed.    Pain Control: Over the Counter Medications to take as needed  Colace/Docusate: May be prescribed by your surgeon to prevent constipation caused by the combination of narcotics, effects of anesthesia, and decreased ambulation.  Hold for loose stools or diarrhea. Take 100 mg 1-2 times a day starting tonight.   Fiber: High  fiber foods, extra liquids (water 9-13 cups/day) can also assist with constipation. Examples of high fiber foods are fruit, bran. Prune juice and water are also good liquids to drink.  Milk of Magnesia/Miralax:  If constipated despite takeing the over the counter stool softeners, you may take Milk of Magnesia or Miralax as directed on bottle to assist with constipation.     Pepcid/Famotidine: May be prescribed while  taking naproxen (Aleve) or other NSAIDs such as ibuprofen (Motrin/Advil) to prevent stomach upset or Acid-reflux symptoms. Take 1 tablet 1-2 times a day.   **Constipation: The first bowel movement may occur anywhere between 1-5 days after surgery.  As long as you are not nauseated or not having significant abdominal pain this variation is acceptable. Narcotic pain medications can cause constipation increasing discomfort; early discontinuation will assist with bowel management. If constipated despite taking stool softeners, you may take Milk of Magnesia or Miralax as directed on the bottle.     **Home medications: You may restart your home medications as directed by your respective Primary Care Physician or Surgeon.   When to notify your Doctor or Healthcare Team   Sign of Wound Infection   Fever over 100 degrees.  Wound becomes extremely swollen, shows red streaks, warm to the touch, and/or drainage from the incision site or foul-smelling drainage.  Wound edges separate or opens up  Bleeding or bruising   If you have bleeding, apply pressure to the site and hold the pressure firmly for 5 minutes. If the bleeding continues, apply pressure again and call 911. If the bleeding stopped, call your doctor to report it.   Call your doctor or nurse if you have increased bleeding from your site and increased bruising or a lump forms or gets larger under your skin at the site. Unrelieved Pain   Call your doctor or nurse if your pain gets worse or is not eased 1 hour after taking your pain medicine, or if it is severe and uncontrolled. Nausea and Vomiting   Call your doctor or nurse if you have nausea and vomiting that continues more than 24 hours, will not let you keep medicine down and will not let you keep fluids down  Fever, Flu-like symptoms   Fever over 100 degrees and/or chills  Gastrointestinal Bleeding Symptoms    Black tarry bowel movements.  This can be normal after surgery on the stomach, but  should resolve in a day or two.    Call 911 if you suddenly have signs of blood loss such as:  Vomiting blood  Fast heart rate  Feeling faint, sweaty, or blacking out  Passing bright red blood from your rectum  Blood Clot Symptoms   Tender, swollen or reddened areas in your calf muscle or thighs.  Numbness or tingling in your lower leg or calf, or at the top of your leg or groin  Skin on your leg looks pale or blue or feels cold to touch  Chest pain or have trouble breathing, lightheadedness, fast heart rate  Sudden Onset of Symptoms    Call 911 if you suddenly have:  Leg weakness and spasm  Loss of bladder or bowel function  Seizure  Confusion, severe headache, dizziness or feeling unsteady, problems talking, difficulty swallowing, and/or numbness or muscle weakness as these could be signs of a stroke.  Follow up Appointment Your follow up appointment should be scheduled 2-3 weeks after your surgery date.  If you have not previously scheduled for a follow-up visit you can  be scheduled by contacting [phone].    Post Anesthesia Home Care Instructions  Activity: Get plenty of rest for the remainder of the day. A responsible individual must stay with you for 24 hours following the procedure.  For the next 24 hours, DO NOT: -Drive a car -Advertising copywriter -Drink alcoholic beverages -Take any medication unless instructed by your physician -Make any legal decisions or sign important papers.  Meals: Start with liquid foods such as gelatin or soup. Progress to regular foods as tolerated. Avoid greasy, spicy, heavy foods. If nausea and/or vomiting occur, drink only clear liquids until the nausea and/or vomiting subsides. Call your physician if vomiting continues.  Special Instructions/Symptoms: Your throat may feel dry or sore from the anesthesia or the breathing tube placed in your throat during surgery. If this causes discomfort, gargle with warm salt water. The discomfort should  disappear within 24 hours.

## 2023-06-18 LAB — URINALYSIS, ROUTINE W REFLEX MICROSCOPIC
Bilirubin, UA: NEGATIVE
Glucose, UA: NEGATIVE
Ketones, UA: NEGATIVE
Leukocytes,UA: NEGATIVE
Nitrite, UA: NEGATIVE
Protein,UA: NEGATIVE
RBC, UA: NEGATIVE
Specific Gravity, UA: >1.03 — ABNORMAL HIGH (ref 1.005–1.030)
Urobilinogen, Ur: 0.2 mg/dL (ref 0.2–1.0)
pH, UA: 5.5 (ref 5.0–7.5)

## 2023-06-18 NOTE — Telephone Encounter (Signed)
Pt aware that Dr. Margo Aye is out of the office and has been scheduled for 07/03/23 to discuss more options.

## 2023-06-19 LAB — SURGICAL PATHOLOGY

## 2023-06-29 ENCOUNTER — Emergency Department (HOSPITAL_COMMUNITY)
Admission: EM | Admit: 2023-06-29 | Discharge: 2023-06-29 | Disposition: A | Discharge status: Home / Self Care | Payer: 59 | Attending: Emergency Medicine | Admitting: Emergency Medicine

## 2023-06-29 ENCOUNTER — Encounter (HOSPITAL_COMMUNITY): Payer: Self-pay

## 2023-06-29 ENCOUNTER — Other Ambulatory Visit: Payer: Self-pay

## 2023-06-29 DIAGNOSIS — R0981 Nasal congestion: Secondary | ICD-10-CM | POA: Diagnosis not present

## 2023-06-29 DIAGNOSIS — J0100 Acute maxillary sinusitis, unspecified: Secondary | ICD-10-CM

## 2023-06-29 DIAGNOSIS — H9202 Otalgia, left ear: Secondary | ICD-10-CM | POA: Diagnosis not present

## 2023-06-29 MED ORDER — AMOXICILLIN-POT CLAVULANATE 875-125 MG PO TABS: 1.0000 | ORAL_TABLET | Freq: Two times a day (BID) | ORAL | 0 refills | Status: DC

## 2023-06-29 NOTE — Discharge Instructions (Addendum)
Return if any problems.

## 2023-06-29 NOTE — ED Triage Notes (Signed)
Patient reports intermittent pain in the left ear. Patient recently had a cold. No drainage.

## 2023-06-29 NOTE — ED Provider Notes (Signed)
[place] EMERGENCY DEPARTMENT AT [facility] Provider Note   CSN: [id number] Arrival date & time: 06/29/23  9562     History  Chief Complaint  Patient presents with   Otalgia    Jorge Walker is a 57 y.o. male.  Patient complains of sinus congestion.  Patient reports that he has had a cough and congestion for 2 weeks.  Patient reports that for the last week he has had pain in his left ear.  Patient tried taking some Benadryl without relief.  Patient reports the pain in his ear has increased.  Patient denies any fever or chills.  He denies any exposure to anyone with flu or COVID.  He is not experiencing any shortness of breath.   Otalgia      Home Medications Prior to Admission medications   Medication Sig Start Date End Date Taking? Authorizing Provider  amoxicillin-clavulanate (AUGMENTIN) 875-125 MG tablet Take 1 tablet by mouth 2 (two) times daily. 06/29/23  Yes Cheron Schaumann K, PA-C  atorvastatin (LIPITOR) 40 MG tablet Take 1 tablet (40 mg total) by mouth daily. 05/21/23   Grayce Sessions, NP  olmesartan-hydrochlorothiazide (BENICAR HCT) 20-12.5 MG tablet Take 1 tablet by mouth daily. 03/27/23   Grayce Sessions, NP  olopatadine (PATANOL) 0.1 % ophthalmic solution Place 1 drop into both eyes 2 (two) times daily. 04/21/23   Grayce Sessions, NP  tamsulosin (FLOMAX) 0.4 MG CAPS capsule Take 1 capsule (0.4 mg total) by mouth daily after supper. 03/19/23   Joline Maxcy, MD  Vitamin D, Ergocalciferol, (DRISDOL) 1.25 MG (50000 UNIT) CAPS capsule Take 1 capsule (50,000 Units total) by mouth every 7 (seven) days. 02/14/23   Grayce Sessions, NP      Allergies    Amlodipine    Review of Systems   Review of Systems  HENT:  Positive for ear pain.   All other systems reviewed and are negative.   Physical Exam Updated Vital Signs BP 110/77   Pulse 80   Temp (!) 97.4 F (36.3 C) (Oral)   Resp 16   Ht 5\' 10"  (1.778 m)   Wt 81.6 kg   SpO2 100%    BMI 25.83 kg/m  Physical Exam Vitals and nursing note reviewed.  Constitutional:      Appearance: He is well-developed.  HENT:     Head: Normocephalic.  Cardiovascular:     Rate and Rhythm: Normal rate.  Pulmonary:     Effort: Pulmonary effort is normal.  Abdominal:     General: There is no distension.  Musculoskeletal:        General: Normal range of motion.     Cervical back: Normal range of motion.  Skin:    General: Skin is warm.  Neurological:     General: No focal deficit present.     Mental Status: He is alert and oriented to person, place, and time.  Psychiatric:        Mood and Affect: Mood normal.     ED Results / Procedures / Treatments   Labs (all labs ordered are listed, but only abnormal results are displayed) Labs Reviewed - No data to display  EKG None  Radiology No results found.  Procedures Procedures    Medications Ordered in ED Medications - No data to display  ED Course/ Medical Decision Making/ A&P  Medical Decision Making Patient complains of left ear pain sinus pressure sinus congestion for the past 2 weeks  Risk Prescription drug management. Risk Details: Patient given a prescription for Augmentin.  He is advised to follow-up with his primary care physician if symptoms persist.           Final Clinical Impression(s) / ED Diagnoses Final diagnoses:  Acute maxillary sinusitis, recurrence not specified  Ear pain, left    Rx / DC Orders ED Discharge Orders          Ordered    amoxicillin-clavulanate (AUGMENTIN) 875-125 MG tablet  2 times daily        06/29/23 0751           An After Visit Summary was printed and given to the patient.    Elson Areas, [place] 06/29/23 4098    Cathren Laine, MD 06/30/23 [phone]

## 2023-07-03 ENCOUNTER — Ambulatory Visit: Payer: 59 | Admitting: Urology

## 2023-07-03 NOTE — Progress Notes (Deleted)
   Assessment: 1. Malignant neoplasm of prostate (HCC)   2. Benign localized prostatic hyperplasia with lower urinary tract symptoms (LUTS)     Plan: ***  Chief Complaint: No chief complaint on file.   HPI: Jorge Walker is a 57 y.o. male who presents for continued evaluation of low risk prostate cancer. Please see my note 02/25/2023 at the time of initial visit for detailed history and exam.   Prostate cancer summary: Initial diagnosis: TRUS/BX 02/2023 Pathology: Gleason 3+3 = 6 in 1 of 12 cores (left lateral apex) with 13% core involvement Prostate volume = 60 mL PSA 01/2023 = 5.8 Clinical stage T1c/DRE:  nst, prostate large 60gm with mild symmetry R greater than L.  NO focal induration or nodularity  GPS= 12 (ave for group= 24)  Multiparametric prostate MRI 05/09/2023-- IMPRESSION: 1. Two PI-RADS category 3 lesions are identified in the peripheral zone. Targeting data sent to [facility]. 2. Prostatomegaly and benign prostatic hypertrophy. 3. Central pelvic kidney. 4.  Prostate Volume= 87.4  Portions of the above documentation were copied from a prior visit for review purposes only.  Allergies: Allergies  Allergen Reactions   Amlodipine Swelling    ALL    PMH: Past Medical History:  Diagnosis Date   Allergy    Arthritis    finger   BPH (benign prostatic hypertrophy) 2020   BPH with elevated PSA   Epididymal cyst    Hypertension    Tobacco use disorder     PSH: Past Surgical History:  Procedure Laterality Date   ARM WOUND REPAIR / CLOSURE     right upper posterior arm - teenager, s/p trauma   ARTERY REPAIR     right upper arm - teenager, s/p trauma to right upper arm   COLONOSCOPY  01/24/2016   Diverticulosis in left and right colon.   Repeat 10 years.  Dr. Yancey Flemings   ELBOW SURGERY  2019    SH: Social History   Tobacco Use   Smoking status: Every Day    Current packs/day: 0.50    Average packs/day: 0.5 packs/day for 17.0 years (8.5 ttl pk-yrs)     Types: Cigarettes   Smokeless tobacco: Never  Vaping Use   Vaping status: Never Used  Substance Use Topics   Alcohol use: Yes    Alcohol/week: 5.0 standard drinks of alcohol    Types: 5 Shots of liquor per week    Comment: weekends    Drug use: No    ROS: Constitutional:  Negative for fever, chills, weight loss CV: Negative for chest pain, previous MI, hypertension Respiratory:  Negative for shortness of breath, wheezing, sleep apnea, frequent cough GI:  Negative for nausea, vomiting, bloody stool, GERD  PE: There were no vitals taken for this visit. GENERAL APPEARANCE:  Well appearing, well developed, well nourished, NAD HEENT:  Atraumatic, normocephalic, oropharynx clear NECK:  Supple without lymphadenopathy or thyromegaly ABDOMEN:  Soft, non-tender, no masses EXTREMITIES:  Moves all extremities well, without clubbing, cyanosis, or edema NEUROLOGIC:  Alert and oriented x 3, normal gait, CN II-XII grossly intact MENTAL STATUS:  appropriate BACK:  Non-tender to palpation, No CVAT SKIN:  Warm, dry, and intact   Results: No results found for this or any previous visit (from the past 24 hour(s)).

## 2023-08-18 ENCOUNTER — Ambulatory Visit (INDEPENDENT_AMBULATORY_CARE_PROVIDER_SITE_OTHER): Payer: 59 | Admitting: Primary Care

## 2023-09-12 ENCOUNTER — Other Ambulatory Visit (INDEPENDENT_AMBULATORY_CARE_PROVIDER_SITE_OTHER): Payer: Self-pay | Admitting: Primary Care

## 2023-09-12 NOTE — Telephone Encounter (Signed)
Requested Prescriptions  Pending Prescriptions Disp Refills   olmesartan-hydrochlorothiazide (BENICAR HCT) 20-12.5 MG tablet [Pharmacy Med Name: OLMESARTAN-HCTZ 20-12.5 MG TAB] 90 tablet 0    Sig: TAKE 1 TABLET BY MOUTH EVERY DAY     Cardiovascular: ARB + Diuretic Combos Passed - 09/12/2023  9:18 AM      Passed - K in normal range and within 180 days    Potassium  Date Value Ref Range Status  06/12/2023 4.4 3.5 - 5.1 mmol/L Final         Passed - Na in normal range and within 180 days    Sodium  Date Value Ref Range Status  06/12/2023 136 135 - 145 mmol/L Final  02/13/2023 142 134 - 144 mmol/L Final         Passed - Cr in normal range and within 180 days    Creat  Date Value Ref Range Status  01/02/2016 1.03 0.70 - 1.33 mg/dL Final   Creatinine, Ser  Date Value Ref Range Status  06/12/2023 1.16 0.61 - 1.24 mg/dL Final         Passed - eGFR is 10 or above and within 180 days    GFR calc Af Amer  Date Value Ref Range Status  09/06/2020 75 >59 mL/min/1.73 Final    Comment:    **In accordance with recommendations from the NKF-ASN Task force,**   [facility] is in the process of updating its eGFR calculation to the   2021 CKD-EPI creatinine equation that estimates kidney function   without a race variable.    GFR, Estimated  Date Value Ref Range Status  06/12/2023 >60 >60 mL/min Final    Comment:    (NOTE) Calculated using the CKD-EPI Creatinine Equation (2021)    eGFR  Date Value Ref Range Status  02/13/2023 82 >59 mL/min/1.73 Final         Passed - Patient is not pregnant      Passed - Last BP in normal range    BP Readings from Last 1 Encounters:  06/29/23 110/77         Passed - Valid encounter within last 6 months    Recent Outpatient Visits           3 months ago Essential hypertension   [place] [facility] Grayce Sessions, NP   5 months ago Eye redness   [place] [facility] Anders Simmonds, PA-C   6 months  ago Essential hypertension   [place] [facility] Grayce Sessions, NP   7 months ago Encounter to establish care    [facility] Grayce Sessions, NP       Future Appointments             In 2 months Joline Maxcy, MD [facility] at [facility]

## 2023-11-13 ENCOUNTER — Encounter: Payer: Self-pay | Admitting: Urology

## 2023-11-13 ENCOUNTER — Ambulatory Visit: Payer: 59 | Admitting: Urology

## 2023-11-13 VITALS — BP 144/75 | HR 61 | Ht 70.0 in | Wt 181.0 lb

## 2023-11-13 DIAGNOSIS — C61 Malignant neoplasm of prostate: Secondary | ICD-10-CM | POA: Diagnosis not present

## 2023-11-13 NOTE — Progress Notes (Addendum)
   Assessment: 1. Malignant neoplasm of prostate [facility])     Plan: Today I again had a long discussion with the patient regarding his low risk prostate cancer on active surveillance and need for continued monitoring.  Will obtain PSA today.  Patient will follow-up in 4 months with PSA prior to visit.  Will subsequently need repeat biopsy.  Consider MRI fusion bx at that time.  Chief Complaint: Prostate cancer  HPI: Jorge Walker is a 58 y.o. male who presents for continued evaluation of low risk CaP. No current complaints. Please see my note 02/25/2023 at the time of initial visit for detailed history and exam.   Prostate cancer summary: Initial diagnosis: TRUS/BX 02/2023 Pathology: Gleason 3+3 = 6 in 1 of 12 cores (left lateral apex) with 13% core involvement Prostate volume = 60 mL PSA 01/2023 = 5.8 Clinical stage T1c/DRE:  nst, prostate large 60gm with mild symmetry R greater than L.  NO focal induration or nodularity   GPS= 12 (ave for group= 24)   Multiparametric prostate MRI 05/09/2023-- IMPRESSION: 1. Two PI-RADS category 3 lesions are identified in the peripheral zone. Targeting data sent to [facility]. 2. Prostatomegaly and benign prostatic hypertrophy. 3. Central pelvic kidney. 4.  Prostate Volume= 87.4  Portions of the above documentation were copied from a prior visit for review purposes only.  Allergies: Allergies  Allergen Reactions   Amlodipine Swelling    ALL    PMH: Past Medical History:  Diagnosis Date   Allergy    Arthritis    finger   BPH (benign prostatic hypertrophy) 2020   BPH with elevated PSA   Epididymal cyst    Hypertension    Tobacco use disorder     PSH: Past Surgical History:  Procedure Laterality Date   ARM WOUND REPAIR / CLOSURE     right upper posterior arm - teenager, s/p trauma   ARTERY REPAIR     right upper arm - teenager, s/p trauma to right upper arm   COLONOSCOPY  01/24/2016   Diverticulosis in left and right colon.    Repeat 10 years.  Dr. Yancey Flemings   ELBOW SURGERY  2019    SH: Social History   Tobacco Use   Smoking status: Every Day    Current packs/day: 0.50    Average packs/day: 0.5 packs/day for 17.0 years (8.5 ttl pk-yrs)    Types: Cigarettes   Smokeless tobacco: Never  Vaping Use   Vaping status: Never Used  Substance Use Topics   Alcohol use: Yes    Alcohol/week: 5.0 standard drinks of alcohol    Types: 5 Shots of liquor per week    Comment: weekends    Drug use: No    ROS: Constitutional:  Negative for fever, chills, weight loss CV: Negative for chest pain, previous MI, hypertension Respiratory:  Negative for shortness of breath, wheezing, sleep apnea, frequent cough GI:  Negative for nausea, vomiting, bloody stool, GERD  PE: BP (!) 144/75   Pulse 61   Ht 5\' 10"  (1.778 m)   Wt 181 lb (82.1 kg)   BMI 25.97 kg/m  GENERAL APPEARANCE:  Well appearing, well developed, well nourished, NAD

## 2023-11-14 LAB — PSA: Prostate Specific Ag, Serum: 7 ng/mL — ABNORMAL HIGH (ref 0.0–4.0)

## 2023-11-17 ENCOUNTER — Telehealth: Payer: Self-pay | Admitting: Urology

## 2023-11-17 NOTE — Telephone Encounter (Signed)
 Patient would like someone to call him and go over his PSA results.

## 2023-11-18 NOTE — Telephone Encounter (Signed)
 Patient aware of the results and the recommended follow up. He verbalized understanding.

## 2023-12-02 ENCOUNTER — Ambulatory Visit (INDEPENDENT_AMBULATORY_CARE_PROVIDER_SITE_OTHER): Admitting: Primary Care

## 2023-12-02 ENCOUNTER — Encounter (INDEPENDENT_AMBULATORY_CARE_PROVIDER_SITE_OTHER): Payer: Self-pay | Admitting: Primary Care

## 2023-12-02 VITALS — BP 104/68 | HR 61 | Resp 16

## 2023-12-02 DIAGNOSIS — G8929 Other chronic pain: Secondary | ICD-10-CM

## 2023-12-02 DIAGNOSIS — M25512 Pain in left shoulder: Secondary | ICD-10-CM | POA: Diagnosis not present

## 2023-12-02 DIAGNOSIS — I10 Essential (primary) hypertension: Secondary | ICD-10-CM

## 2023-12-02 DIAGNOSIS — E782 Mixed hyperlipidemia: Secondary | ICD-10-CM

## 2023-12-02 DIAGNOSIS — Z760 Encounter for issue of repeat prescription: Secondary | ICD-10-CM | POA: Diagnosis not present

## 2023-12-02 DIAGNOSIS — E559 Vitamin D deficiency, unspecified: Secondary | ICD-10-CM

## 2023-12-02 MED ORDER — OLMESARTAN MEDOXOMIL-HCTZ 20-12.5 MG PO TABS: 1.0000 | ORAL_TABLET | Freq: Every day | ORAL | 0 refills | Status: DC

## 2023-12-02 NOTE — Progress Notes (Signed)
 [facility]  Jorge Walker, is a 58 y.o. male  ZOX:[id number]  [id number]  DOB - 09-Dec-1965  Chief Complaint  Patient presents with   Shoulder Pain    left       Subjective:   Jorge Walker is a 58 y.o. male here today for a follow up visit. HTN- Patient has No headache, No chest pain, No abdominal pain - No Nausea, No new weakness tingling or numbness, No Cough - shortness of breath. Patient c/o left shoulder pain hx of surgery . Pain is worst at bedtime when he accidentally rolls over on that side. Denies weakness, numbness or tingling . Pain 7/10- tylenol or ibuprofen with little relief. Elevated lipids not taking medication - ate today will return fastig   No problems updated.  Comprehensive ROS Pertinent positive and negative noted in HPI   Allergies  Allergen Reactions   Amlodipine Swelling    ALL    Past Medical History:  Diagnosis Date   Allergy    Arthritis    finger   BPH (benign prostatic hypertrophy) 2020   BPH with elevated PSA   Epididymal cyst    Hypertension    Tobacco use disorder     Current Outpatient Medications on File Prior to Visit  Medication Sig Dispense Refill   amoxicillin-clavulanate (AUGMENTIN) 875-125 MG tablet Take 1 tablet by mouth 2 (two) times daily. (Patient not taking: Reported on 12/02/2023) 20 tablet 0   atorvastatin (LIPITOR) 40 MG tablet Take 1 tablet (40 mg total) by mouth daily. 90 tablet 3   olopatadine (PATANOL) 0.1 % ophthalmic solution Place 1 drop into both eyes 2 (two) times daily. 5 mL 12   tamsulosin (FLOMAX) 0.4 MG CAPS capsule Take 1 capsule (0.4 mg total) by mouth daily after supper. 90 capsule 3   Vitamin D, Ergocalciferol, (DRISDOL) 1.25 MG (50000 UNIT) CAPS capsule Take 1 capsule (50,000 Units total) by mouth every 7 (seven) days. (Patient not taking: Reported on 12/02/2023) 8 capsule 0   No current facility-administered medications on file prior to visit.   Health Maintenance  Topic  Date Due   Pneumococcal Vaccination (2 of 2 - PCV) 08/18/2015   COVID-19 Vaccine (2 - Mixed Product risk series) 06/21/2023   Flu Shot  03/26/2024   DTaP/Tdap/Td vaccine (2 - Td or Tdap) 08/17/2024   Colon Cancer Screening  01/23/2026   Hepatitis C Screening  Completed   HIV Screening  Completed   Zoster (Shingles) Vaccine  Completed   HPV Vaccine  Aged Out    Objective:   Vitals:   12/02/23 1444  BP: 104/68  Pulse: 61  Resp: 16  SpO2: 99%    Physical Exam Vitals reviewed.  Constitutional:      Appearance: Normal appearance.  HENT:     Head: Normocephalic.     Right Ear: Tympanic membrane, ear canal and external ear normal.     Left Ear: Tympanic membrane, ear canal and external ear normal.     Nose: Nose normal.  Eyes:     Extraocular Movements: Extraocular movements intact.     Pupils: Pupils are equal, round, and reactive to light.  Cardiovascular:     Rate and Rhythm: Normal rate and regular rhythm.  Pulmonary:     Effort: Pulmonary effort is normal.     Breath sounds: Normal breath sounds.  Abdominal:     General: Bowel sounds are normal.     Palpations: Abdomen is soft.  Musculoskeletal:  General: Normal range of motion.     Cervical back: Normal range of motion.  Skin:    General: Skin is warm and dry.  Neurological:     Mental Status: He is alert and oriented to person, place, and time.  Psychiatric:        Mood and Affect: Mood normal.        Behavior: Behavior normal.     Assessment & Plan  Jorge Walker was seen today for shoulder pain.  Diagnoses and all orders for this visit:  Chronic left shoulder pain -     Ambulatory referral to Orthopedic Surgery  Essential hypertension Well controlled DIET: Limit salt intake, read nutrition labels to check salt content, limit fried and high fatty foods  Avoid using multisymptom OTC cold preparations that generally contain sudafed which can rise BP. Consult with pharmacist on best cold relief products to  use for persons with HTN EXERCISE Discussed incorporating exercise such as walking - 30 minutes most days of the week and can do in 10 minute intervals     Vitamin D deficiency -     Vitamin D, 25-hydroxy  Medication refill -     olmesartan-hydrochlorothiazide (BENICAR HCT) 20-12.5 MG tablet; Take 1 tablet by mouth daily.  Mixed hyperlipidemia -     Lipid panel; Future       Patient have been counseled extensively about nutrition and exercise. Other issues discussed during this visit include: low cholesterol diet, weight control and daily exercise, foot care, annual eye examinations at Ophthalmology, importance of adherence with medications and regular follow-up. We also discussed long term complications of uncontrolled diabetes and hypertension.   Return in about 3 months (around 03/02/2024) for fasting labs.  The patient was given clear instructions to go to ER or return to medical center if symptoms don't improve, worsen or new problems develop. The patient verbalized understanding. The patient was told to call to get lab results if they haven't heard anything in the next week.   This note has been created with Education officer, environmental. Any transcriptional errors are unintentional.   Marius Siemens, NP 12/02/2023, 3:00 PM

## 2024-01-13 ENCOUNTER — Other Ambulatory Visit (INDEPENDENT_AMBULATORY_CARE_PROVIDER_SITE_OTHER): Payer: Self-pay | Admitting: Primary Care

## 2024-01-13 DIAGNOSIS — I10 Essential (primary) hypertension: Secondary | ICD-10-CM

## 2024-01-14 NOTE — Telephone Encounter (Signed)
 Too soon for refill, refilled 4//25 for 90 days.  Requested Prescriptions  Pending Prescriptions Disp Refills   olmesartan -hydrochlorothiazide (BENICAR  HCT) 20-12.5 MG tablet [Pharmacy Med Name: OLMESARTAN -HCTZ 20-12.5 MG TAB] 90 tablet 0    Sig: TAKE 1 TABLET BY MOUTH EVERY DAY     Cardiovascular: ARB + Diuretic Combos Failed - 01/14/2024  1:59 PM      Failed - K in normal range and within 180 days    Potassium  Date Value Ref Range Status  06/12/2023 4.4 3.5 - 5.1 mmol/L Final         Failed - Na in normal range and within 180 days    Sodium  Date Value Ref Range Status  06/12/2023 136 135 - 145 mmol/L Final  02/13/2023 142 134 - 144 mmol/L Final         Failed - Cr in normal range and within 180 days    Creat  Date Value Ref Range Status  01/02/2016 1.03 0.70 - 1.33 mg/dL Final   Creatinine, Ser  Date Value Ref Range Status  06/12/2023 1.16 0.61 - 1.24 mg/dL Final         Failed - eGFR is 10 or above and within 180 days    GFR calc Af Amer  Date Value Ref Range Status  09/06/2020 75 >59 mL/min/1.73 Final    Comment:    **In accordance with recommendations from the NKF-ASN Task force,**   [facility] is in the process of updating its eGFR calculation to the   2021 CKD-EPI creatinine equation that estimates kidney function   without a race variable.    GFR, Estimated  Date Value Ref Range Status  06/12/2023 >60 >60 mL/min Final    Comment:    (NOTE) Calculated using the CKD-EPI Creatinine Equation (2021)    eGFR  Date Value Ref Range Status  02/13/2023 82 >59 mL/min/1.73 Final         Failed - Valid encounter within last 6 months    Recent Outpatient Visits           1 month ago Chronic left shoulder pain   [place] [facility] Marius Siemens, NP   7 months ago Essential hypertension   [place] [facility] Marius Siemens, NP   10 months ago Eye redness   [place] [facility] Hassie Lint, PA-C   10 months ago Essential hypertension   [place] [facility] Marius Siemens, NP   11 months ago Encounter to establish care   [place] [facility] Marius Siemens, NP       Future Appointments             In 2 months Stoneking, Ponce Brisker., MD [facility] at [facility] - Patient is not pregnant      Passed - Last BP in normal range    BP Readings from Last 1 Encounters:  12/02/23 104/68

## 2024-02-10 ENCOUNTER — Other Ambulatory Visit (INDEPENDENT_AMBULATORY_CARE_PROVIDER_SITE_OTHER): Payer: Self-pay | Admitting: Primary Care

## 2024-02-10 DIAGNOSIS — E782 Mixed hyperlipidemia: Secondary | ICD-10-CM

## 2024-02-12 NOTE — Telephone Encounter (Signed)
 Requested medications are due for refill today.  no  Requested medications are on the active medications list.  yes  Last refill. 05/21/2023 #90 3 rf  Future visit scheduled.   no  Notes to clinic.  Protocol would not attach. Refill not due.    Requested Prescriptions  Pending Prescriptions Disp Refills   atorvastatin  (LIPITOR) 40 MG tablet [Pharmacy Med Name: ATORVASTATIN  40 MG TABLET] 90 tablet 4    Sig: TAKE 1 TABLET BY MOUTH EVERY DAY     Cardiovascular:  Antilipid - Statins Failed - 02/12/2024 11:12 AM      Failed - Lipid Panel in normal range within the last 12 months    Cholesterol, Total  Date Value Ref Range Status  05/20/2023 172 100 - 199 mg/dL Final   LDL Chol Calc (NIH)  Date Value Ref Range Status  05/20/2023 109 (H) 0 - 99 mg/dL Final   HDL  Date Value Ref Range Status  05/20/2023 33 (L) >39 mg/dL Final   Triglycerides  Date Value Ref Range Status  05/20/2023 168 (H) 0 - 149 mg/dL Final         Passed - Patient is not pregnant      Passed - Valid encounter within last 12 months    Recent Outpatient Visits           2 months ago Chronic left shoulder pain   [place] [facility] Marius Siemens, NP   8 months ago Essential hypertension   [place] [facility] Marius Siemens, NP   10 months ago Eye redness   [facility] Dulce Gibbs M, PA-C   11 months ago Essential hypertension   [place] [facility] Marius Siemens, NP   12 months ago Encounter to establish care   [place] [facility] Marius Siemens, NP       Future Appointments             In 1 month Stoneking, Ponce Brisker., MD [facility] at [facility]

## 2024-03-02 ENCOUNTER — Ambulatory Visit (INDEPENDENT_AMBULATORY_CARE_PROVIDER_SITE_OTHER): Admitting: Primary Care

## 2024-03-02 ENCOUNTER — Encounter (INDEPENDENT_AMBULATORY_CARE_PROVIDER_SITE_OTHER): Payer: Self-pay | Admitting: Primary Care

## 2024-03-02 VITALS — BP 107/73 | HR 65 | Resp 16 | Wt 182.6 lb

## 2024-03-02 DIAGNOSIS — L0591 Pilonidal cyst without abscess: Secondary | ICD-10-CM | POA: Diagnosis not present

## 2024-03-02 DIAGNOSIS — E559 Vitamin D deficiency, unspecified: Secondary | ICD-10-CM

## 2024-03-02 DIAGNOSIS — I10 Essential (primary) hypertension: Secondary | ICD-10-CM | POA: Diagnosis not present

## 2024-03-02 DIAGNOSIS — E782 Mixed hyperlipidemia: Secondary | ICD-10-CM | POA: Diagnosis not present

## 2024-03-02 NOTE — Patient Instructions (Signed)
How to Take a [employer] A sitz bath is a warm water bath that may be used to care for your rectum, genital area, or the area between your rectum and genitals (perineum). In a sitz bath, the water only comes up to your hips and covers your buttocks. A sitz bath may be done in a bathtub or with a portable sitz bath that fits over the toilet. Your health care provider may recommend a sitz bath to help: Relieve pain and discomfort after delivering a baby. Relieve pain and itching from hemorrhoids or anal fissures. Relieve pain after certain surgeries. Relax muscles that are sore or tight. How to take a sitz bath Take 2-4 sitz baths a day, or as many as told by your health care provider. Bathtub sitz bath To take a sitz bath in a bathtub: Partially fill a bathtub with warm water. The water should be deep enough to cover your hips and buttocks when you are sitting in the bathtub. Follow your health care provider's instructions if you are told to put medicine in the water. Sit in the water. Open the bathtub drain a little, and leave it open during your bath. Turn on the warm water again, enough to replace the water that is draining out. Keep the water running throughout your bath. This helps keep the water at the right level and temperature. Soak in the water for 15-20 minutes, or as long as told by your health care provider. When you are done, be careful when you stand up. You may feel dizzy. After the sitz bath, pat yourself dry. Do not rub your skin to dry it.  Over-the-toilet sitz bath To take a sitz bath with an over-the-toilet basin: Follow the manufacturer's instructions. Fill the basin with warm water. Follow your health care provider's instructions if you were told to put medicine in the water. Sit on the seat. Make sure the water covers your buttocks and perineum. Soak in the water for 15-20 minutes, or as long as told by your health care provider. After the sitz bath, pat yourself dry.  Do not rub your skin to dry it. Clean and dry the basin between uses. Discard the basin if it cracks, or according to the manufacturer's instructions.  Contact a health care provider if: Your pain or itching gets worse. Stop doing sitz baths if your symptoms get worse. You have new symptoms. Stop doing sitz baths until you talk with your health care provider. Summary A sitz bath is a warm water bath in which the water only comes up to your hips and covers your buttocks. Your health care provider may recommend a sitz bath to help relieve pain and discomfort after delivering a baby, relieve pain and itching from hemorrhoids or anal fissures, relieve pain after certain surgeries, or help to relax muscles that are sore or tight. Take 2-4 sitz baths a day, or as many as told by your health care provider. Soak in the water for 15-20 minutes. Stop doing sitz baths if your symptoms get worse. This information is not intended to replace advice given to you by your health care provider. Make sure you discuss any questions you have with your health care provider. Document Revised: 11/13/2021 Document Reviewed: 11/13/2021 Elsevier Patient Education  2024 [employer].

## 2024-03-02 NOTE — Progress Notes (Signed)
 [facility]   Jorge Walker is a 58 y.o. male presents for hypertension evaluation, Denies shortness of breath, headaches, chest pain or lower extremity edema, sudden onset, vision changes, unilateral weakness, dizziness, paresthesias   Patient reports adherence with medications.  Dietary habits include: Started monitoring sodium intake reading labels, decreasing the amount of pork and salt use Exercise habits include: Walking Family / Social history: None  Patient voices complaint of a cyst on buttocks.  Will evaluate with chaperone present Past Medical History:  Diagnosis Date   Allergy    Arthritis    finger   BPH (benign prostatic hypertrophy) 2020   BPH with elevated PSA   Epididymal cyst    Hypertension    Tobacco use disorder    Past Surgical History:  Procedure Laterality Date   ARM WOUND REPAIR / CLOSURE     right upper posterior arm - teenager, s/p trauma   ARTERY REPAIR     right upper arm - teenager, s/p trauma to right upper arm   COLONOSCOPY  01/24/2016   Diverticulosis in left and right colon.   Repeat 10 years.  Dr. Norleen Abran   ELBOW SURGERY  2019   Allergies  Allergen Reactions   Amlodipine  Swelling    ALL   Current Outpatient Medications on File Prior to Visit  Medication Sig Dispense Refill   amoxicillin -clavulanate (AUGMENTIN ) 875-125 MG tablet Take 1 tablet by mouth 2 (two) times daily. (Patient not taking: Reported on 12/02/2023) 20 tablet 0   atorvastatin  (LIPITOR) 40 MG tablet TAKE 1 TABLET BY MOUTH EVERY DAY 90 tablet 4   olmesartan -hydrochlorothiazide (BENICAR  HCT) 20-12.5 MG tablet Take 1 tablet by mouth daily. 90 tablet 0   olopatadine  (PATANOL) 0.1 % ophthalmic solution Place 1 drop into both eyes 2 (two) times daily. 5 mL 12   tamsulosin  (FLOMAX ) 0.4 MG CAPS capsule Take 1 capsule (0.4 mg total) by mouth daily after supper. 90 capsule 3   Vitamin D , Ergocalciferol , (DRISDOL ) 1.25 MG (50000 UNIT) CAPS capsule Take 1  capsule (50,000 Units total) by mouth every 7 (seven) days. (Patient not taking: Reported on 12/02/2023) 8 capsule 0   No current facility-administered medications on file prior to visit.   Social History   Socioeconomic History   Marital status: Single    Spouse name: Not on file   Number of children: Not on file   Years of education: Not on file   Highest education level: Not on file  Occupational History   Not on file  Tobacco Use   Smoking status: Every Day    Current packs/day: 0.50    Average packs/day: 0.5 packs/day for 17.0 years (8.5 ttl pk-yrs)    Types: Cigarettes   Smokeless tobacco: Never  Vaping Use   Vaping status: Never Used  Substance and Sexual Activity   Alcohol use: Yes    Alcohol/week: 5.0 standard drinks of alcohol    Types: 5 Shots of liquor per week    Comment: weekends    Drug use: No   Sexual activity: Not on file  Other Topics Concern   Not on file  Social History Narrative   Single, girlfriend, divorced, has 1 child, 22yo daughter wants to go to college, she lives with mother, exercise with walking at work, works in Teaching laboratory technician at [employer], 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   Social Drivers of Health   Financial Resource Strain: Not on file  Food Insecurity: No Food Insecurity (12/02/2023)  Hunger Vital Sign    Worried About Running Out of Food in the Last Year: Never true    Ran Out of Food in the Last Year: Never true  Transportation Needs: No Transportation Needs (12/02/2023)   PRAPARE - Administrator, Civil Service (Medical): No    Lack of Transportation (Non-Medical): No  Physical Activity: Not on file  Stress: Not on file  Social Connections: Not on file  Intimate Partner Violence: Not At Risk (12/02/2023)   Humiliation, Afraid, Rape, and Kick questionnaire    Fear of Current or Ex-Partner: No    Emotionally Abused: No    Physically Abused: No    Sexually Abused: No   Family History  Problem Relation Age of Onset    Hypertension Mother    Diabetes Mother    Pneumonia Father        died of pneumonia   Cancer Neg Hx    Heart disease Neg Hx    Stroke Neg Hx    Colon cancer Neg Hx    Colon polyps Neg Hx    Esophageal cancer Neg Hx    Rectal cancer Neg Hx    Stomach cancer Neg Hx    Health Maintenance  Topic Date Due   Hepatitis B Vaccines (1 of 3 - 19+ 3-dose series) Never done   Pneumococcal Vaccine 80-77 Years old (2 of 2 - PCV) 08/18/2015   COVID-19 Vaccine (2 - Mixed Product risk series) 06/21/2023   INFLUENZA VACCINE  03/26/2024   DTaP/Tdap/Td (2 - Td or Tdap) 08/17/2024   Colonoscopy  01/23/2026   Hepatitis C Screening  Completed   HIV Screening  Completed   Zoster Vaccines- Shingrix   Completed   HPV VACCINES  Aged Out   Meningococcal B Vaccine  Aged Out   OBJECTIVE:  Vitals:   03/02/24 1457  BP: 107/73  Pulse: 65  Resp: 16  SpO2: 100%  Weight: 182 lb 9.6 oz (82.8 kg)   Physical Exam Vitals reviewed.  Constitutional:      Appearance: He is obese.  HENT:     Head: Normocephalic.     Right Ear: Tympanic membrane and external ear normal.     Left Ear: Tympanic membrane and external ear normal.     Nose: Nose normal.  Eyes:     Extraocular Movements: Extraocular movements intact.     Pupils: Pupils are equal, round, and reactive to light.  Cardiovascular:     Rate and Rhythm: Normal rate and regular rhythm.  Pulmonary:     Effort: Pulmonary effort is normal.     Breath sounds: Normal breath sounds.  Abdominal:     General: Bowel sounds are normal. There is distension.     Palpations: Abdomen is soft.  Genitourinary:    Comments: Small fatty tissue no pain , sore , red swelling size of a nickle Musculoskeletal:        General: Normal range of motion.     Cervical back: Normal range of motion.  Skin:    General: Skin is warm and dry.  Neurological:     Mental Status: He is oriented to person, place, and time.  Psychiatric:        Mood and Affect: Mood normal.         Behavior: Behavior normal.        Thought Content: Thought content normal.        Judgment: Judgment normal.    Review of Systems  All other  systems reviewed and are negative.   Last 3 Office BP readings: BP Readings from Last 3 Encounters:  03/02/24 107/73  12/02/23 104/68  11/13/23 (!) 144/75    BMET    Component Value Date/Time   NA 136 06/12/2023 1453   NA 142 02/13/2023 0924   K 4.4 06/12/2023 1453   CL 104 06/12/2023 1453   CO2 25 06/12/2023 1453   GLUCOSE 94 06/12/2023 1453   BUN 15 06/12/2023 1453   BUN 14 02/13/2023 0924   CREATININE 1.16 06/12/2023 1453   CREATININE 1.03 01/02/2016 1258   CALCIUM  10.1 06/12/2023 1453   GFRNONAA >60 06/12/2023 1453   GFRAA 75 09/06/2020 1601    Renal function: CrCl cannot be calculated (Patient's most recent lab result is older than the maximum 21 days allowed.).  Clinical ASCVD: Yes  The 10-year ASCVD risk score (Arnett DK, et al., 2019) is: 16.2%   Values used to calculate the score:     Age: 42 years     Clincally relevant sex: Male     Is Non-Hispanic African American: Yes     Diabetic: No     Tobacco smoker: Yes     Systolic Blood Pressure: 107 mmHg     Is BP treated: Yes     HDL Cholesterol: 33 mg/dL     Total Cholesterol: 172 mg/dL  ASCVD risk factors include- [place]   ASSESSMENT & PLAN: Quinterious was seen today for hypertension, hyperlipidemia and cyst.  Diagnoses and all orders for this visit:  Vitamin D  deficiency -     Vitamin D , 25-hydroxy  Mixed hyperlipidemia -     Lipid panel   Essential hypertension -Counseled on lifestyle modifications for blood pressure control including reduced dietary sodium, increased exercise, weight reduction and adequate sleep. Also, educated patient about the risk for cardiovascular events, stroke and heart attack. Also counseled patient about the importance of medication adherence. If you participate in smoking, it is important to stop using tobacco as this will  increase the risks associated with uncontrolled blood pressure.  Minimize salt intake. Minimize alcohol intake  -     CBC with Differential/Platelet -     CMP14+EGFR  Cyst near coccyx  No s/s of infection monitor  Sitz bath    This note has been created with Education officer, environmental. Any transcriptional errors are unintentional.   Rosaline SHAUNNA Bohr, NP 03/02/2024, 3:08 PM

## 2024-03-03 LAB — LIPID PANEL
Chol/HDL Ratio: 3.8 ratio (ref 0.0–5.0)
Cholesterol, Total: 110 mg/dL (ref 100–199)
HDL: 29 mg/dL — ABNORMAL LOW (ref >39)
LDL Chol Calc (NIH): 53 mg/dL (ref 0–99)
Triglycerides: 164 mg/dL — ABNORMAL HIGH (ref 0–149)
VLDL Cholesterol Cal: 28 mg/dL (ref 5–40)

## 2024-03-03 LAB — CBC WITH DIFFERENTIAL/PLATELET
Basophils Absolute: 0.1 x10E3/uL (ref 0.0–0.2)
Basos: 1 %
EOS (ABSOLUTE): 0.3 x10E3/uL (ref 0.0–0.4)
Eos: 5 %
Hematocrit: 38.6 % (ref 37.5–51.0)
Hemoglobin: 12.1 g/dL — ABNORMAL LOW (ref 13.0–17.7)
Immature Grans (Abs): 0 x10E3/uL (ref 0.0–0.1)
Immature Granulocytes: 0 %
Lymphocytes Absolute: 3.1 x10E3/uL (ref 0.7–3.1)
Lymphs: 47 %
MCH: 27.4 pg (ref 26.6–33.0)
MCHC: 31.3 g/dL — ABNORMAL LOW (ref 31.5–35.7)
MCV: 88 fL (ref 79–97)
Monocytes Absolute: 0.5 x10E3/uL (ref 0.1–0.9)
Monocytes: 8 %
Neutrophils Absolute: 2.5 x10E3/uL (ref 1.4–7.0)
Neutrophils: 39 %
Platelets: 183 x10E3/uL (ref 150–450)
RBC: 4.41 x10E6/uL (ref 4.14–5.80)
RDW: 13.8 % (ref 11.6–15.4)
WBC: 6.4 x10E3/uL (ref 3.4–10.8)

## 2024-03-03 LAB — VITAMIN D 25 HYDROXY (VIT D DEFICIENCY, FRACTURES): Vit D, 25-Hydroxy: 45.3 ng/mL (ref 30.0–100.0)

## 2024-03-03 LAB — CMP14+EGFR
ALT: 21 IU/L (ref 0–44)
AST: 24 IU/L (ref 0–40)
Albumin: 4.3 g/dL (ref 3.8–4.9)
Alkaline Phosphatase: 65 IU/L (ref 44–121)
BUN/Creatinine Ratio: 17 (ref 9–20)
BUN: 20 mg/dL (ref 6–24)
Bilirubin Total: 0.3 mg/dL (ref 0.0–1.2)
CO2: 23 mmol/L (ref 20–29)
Calcium: 9.9 mg/dL (ref 8.7–10.2)
Chloride: 101 mmol/L (ref 96–106)
Creatinine, Ser: 1.21 mg/dL (ref 0.76–1.27)
Globulin, Total: 2.3 g/dL (ref 1.5–4.5)
Glucose: 102 mg/dL — ABNORMAL HIGH (ref 70–99)
Potassium: 4.9 mmol/L (ref 3.5–5.2)
Sodium: 137 mmol/L (ref 134–144)
Total Protein: 6.6 g/dL (ref 6.0–8.5)
eGFR: 69 mL/min/1.73 (ref >59)

## 2024-03-04 ENCOUNTER — Ambulatory Visit: Payer: Self-pay | Admitting: Primary Care

## 2024-03-17 ENCOUNTER — Other Ambulatory Visit: Payer: Self-pay | Admitting: Urology

## 2024-03-17 ENCOUNTER — Encounter: Payer: Self-pay | Admitting: Urology

## 2024-03-17 ENCOUNTER — Ambulatory Visit (INDEPENDENT_AMBULATORY_CARE_PROVIDER_SITE_OTHER): Admitting: Urology

## 2024-03-17 VITALS — BP 117/74 | HR 48 | Ht 69.0 in | Wt 181.0 lb

## 2024-03-17 DIAGNOSIS — C61 Malignant neoplasm of prostate: Secondary | ICD-10-CM

## 2024-03-17 LAB — URINALYSIS, ROUTINE W REFLEX MICROSCOPIC
Bilirubin, UA: NEGATIVE
Glucose, UA: NEGATIVE
Ketones, UA: NEGATIVE
Leukocytes,UA: NEGATIVE
Nitrite, UA: NEGATIVE
Protein,UA: NEGATIVE
RBC, UA: NEGATIVE
Specific Gravity, UA: 1.025 (ref 1.005–1.030)
Urobilinogen, Ur: 0.2 mg/dL (ref 0.2–1.0)
pH, UA: 5.5 (ref 5.0–7.5)

## 2024-03-17 NOTE — Progress Notes (Signed)
 Assessment: 1. 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 PSA today I discussed the management options for low risk prostate cancer including active surveillance, radical prostatectomy, and radiation therapy.  He is interested in definitive treatment. Recommend reevaluation with repeat fusion biopsy using MRI from 9/24.   Chief Complaint: Chief Complaint  Patient presents with   Prostate Cancer    HPI: Jorge Walker is a 58 y.o. male who presents for continued evaluation of low risk prostate cancer. He was previously followed by Dr. Shona and was last seen in March 2025.  Prostate cancer summary: Initial diagnosis: TRUS/BX 02/2023 Pathology: Gleason 3+3 = 6 in 1 of 12 cores (left lateral apex) with 13% core involvement Prostate volume = 60 mL PSA 01/2023 = 5.8 Clinical stage T1c/DRE:  nst, prostate large 60gm with mild symmetry R greater than L.  NO focal induration or nodularity    GPS= 12 (ave for group= 24)    Multiparametric prostate MRI 05/09/2023-- IMPRESSION: 1. Two PI-RADS category 3 lesions are identified in the peripheral zone. Targeting data sent to [facility]. 2. Prostatomegaly and benign prostatic hypertrophy. 3. Central pelvic kidney. 4.  Prostate Volume= 87.4  He was doing well at his visit in March 2025.  He continued on active surveillance. PSA 3/25:  7.0  He returns today for follow-up.  He continues on tamsulosin  for management of his lower urinary tract symptoms.  His symptoms are stable.  No dysuria or gross hematuria. IPSS = 7. He is interested in treatment for his prostate cancer rather than continuing on active surveillance.  Portions of the above documentation were copied from a prior visit for review purposes only.  Allergies: Allergies  Allergen Reactions   Amlodipine  Swelling    ALL    PMH: Past Medical  History:  Diagnosis Date   Allergy    Arthritis    finger   BPH (benign prostatic hypertrophy) 2020   BPH with elevated PSA   Epididymal cyst    Hypertension    Tobacco use disorder     PSH: Past Surgical History:  Procedure Laterality Date   ARM WOUND REPAIR / CLOSURE     right upper posterior arm - teenager, s/p trauma   ARTERY REPAIR     right upper arm - teenager, s/p trauma to right upper arm   COLONOSCOPY  01/24/2016   Diverticulosis in left and right colon.   Repeat 10 years.  Dr. Norleen Abran   ELBOW SURGERY  2019    SH: Social History   Tobacco Use   Smoking status: Every Day    Current packs/day: 0.50    Average packs/day: 0.5 packs/day for 17.0 years (8.5 ttl pk-yrs)    Types: Cigarettes   Smokeless tobacco: Never  Vaping Use   Vaping status: Never Used  Substance Use Topics   Alcohol use: Yes    Alcohol/week: 5.0 standard drinks of alcohol    Types: 5 Shots of liquor per week    Comment: weekends    Drug use: No    ROS: Constitutional:  Negative for fever, chills, weight loss CV: Negative for chest pain, previous MI, hypertension Respiratory:  Negative for shortness of breath, wheezing, sleep apnea, frequent cough GI:  Negative for nausea, vomiting, bloody stool, GERD  PE: BP 117/74   Pulse (!) 48   Ht 5' 9 (1.753 m)  Wt 181 lb (82.1 kg)   BMI 26.73 kg/m  GENERAL APPEARANCE:  Well appearing, well developed, well nourished, NAD HEENT:  Atraumatic, normocephalic, oropharynx clear NECK:  Supple without lymphadenopathy or thyromegaly ABDOMEN:  Soft, non-tender, no masses EXTREMITIES:  Moves all extremities well, without clubbing, cyanosis, or edema NEUROLOGIC:  Alert and oriented x 3, normal gait, CN II-XII grossly intact MENTAL STATUS:  appropriate BACK:  Non-tender to palpation, No CVAT SKIN:  Warm, dry, and intact   Results: U/A:

## 2024-03-18 ENCOUNTER — Ambulatory Visit: Payer: Self-pay | Admitting: Urology

## 2024-03-18 ENCOUNTER — Ambulatory Visit: Admitting: Urology

## 2024-03-18 LAB — PSA: Prostate Specific Ag, Serum: 6.8 ng/mL — ABNORMAL HIGH (ref 0.0–4.0)

## 2024-03-20 ENCOUNTER — Other Ambulatory Visit (INDEPENDENT_AMBULATORY_CARE_PROVIDER_SITE_OTHER): Payer: Self-pay | Admitting: Primary Care

## 2024-03-20 DIAGNOSIS — I10 Essential (primary) hypertension: Secondary | ICD-10-CM

## 2024-03-22 NOTE — Telephone Encounter (Signed)
 Requested Prescriptions  Pending Prescriptions Disp Refills   olmesartan -hydrochlorothiazide (BENICAR  HCT) 20-12.5 MG tablet [Pharmacy Med Name: OLMESARTAN -HCTZ 20-12.5 MG TAB] 90 tablet 0    Sig: TAKE 1 TABLET BY MOUTH EVERY DAY     Cardiovascular: ARB + Diuretic Combos Passed - 03/22/2024  3:22 PM      Passed - K in normal range and within 180 days    Potassium  Date Value Ref Range Status  03/02/2024 4.9 3.5 - 5.2 mmol/L Final         Passed - Na in normal range and within 180 days    Sodium  Date Value Ref Range Status  03/02/2024 137 134 - 144 mmol/L Final         Passed - Cr in normal range and within 180 days    Creat  Date Value Ref Range Status  01/02/2016 1.03 0.70 - 1.33 mg/dL Final   Creatinine, Ser  Date Value Ref Range Status  03/02/2024 1.21 0.76 - 1.27 mg/dL Final         Passed - eGFR is 10 or above and within 180 days    GFR calc Af Amer  Date Value Ref Range Status  09/06/2020 75 >59 mL/min/1.73 Final    Comment:    **In accordance with recommendations from the NKF-ASN Task force,**   [facility] is in the process of updating its eGFR calculation to the   2021 CKD-EPI creatinine equation that estimates kidney function   without a race variable.    GFR, Estimated  Date Value Ref Range Status  06/12/2023 >60 >60 mL/min Final    Comment:    (NOTE) Calculated using the CKD-EPI Creatinine Equation (2021)    eGFR  Date Value Ref Range Status  03/02/2024 69 >59 mL/min/1.73 Final         Passed - Patient is not pregnant      Passed - Last BP in normal range    BP Readings from Last 1 Encounters:  03/17/24 117/74         Passed - Valid encounter within last 6 months    Recent Outpatient Visits           2 weeks ago Essential hypertension   [place] [facility] Celestia Rosaline SQUIBB, NP   3 months ago Chronic left shoulder pain   [place] [facility] Celestia Rosaline SQUIBB, NP   10 months ago Essential  hypertension   [place] [facility] Celestia Rosaline SQUIBB, NP   1 year ago Eye redness   [place] [facility] Danton Jon HERO, PA-C   1 year ago Essential hypertension   [place] [facility] Celestia Rosaline SQUIBB, NP

## 2024-04-05 ENCOUNTER — Other Ambulatory Visit: Payer: Self-pay | Admitting: Urology

## 2024-04-05 ENCOUNTER — Telehealth: Payer: Self-pay | Admitting: Urology

## 2024-04-05 DIAGNOSIS — C61 Malignant neoplasm of prostate: Secondary | ICD-10-CM

## 2024-04-05 MED ORDER — TAMSULOSIN HCL 0.4 MG PO CAPS
0.4000 mg | ORAL_CAPSULE | Freq: Every day | ORAL | 3 refills | Status: AC
Start: 2024-04-05 — End: ?

## 2024-04-05 NOTE — Telephone Encounter (Signed)
 Pt called to get a refill on medication tamsulosin  (FLOMAX ) 0.4 MG CAPS capsule [[id number]] . Please advise. Thank You.

## 2024-04-19 ENCOUNTER — Encounter: Payer: Self-pay | Admitting: Urology

## 2024-04-19 ENCOUNTER — Telehealth: Payer: Self-pay | Admitting: Urology

## 2024-04-19 NOTE — Telephone Encounter (Signed)
 Patient called regarding FMLA paperwork that was faxed over.

## 2024-04-19 NOTE — Telephone Encounter (Signed)
 Spoke with pt in reference to [facility] paperwork. Pt states he had his HR fax over paperwork due to being out of work for two days. As of now, I have not received any paperwork for him.

## 2024-04-21 ENCOUNTER — Telehealth: Payer: Self-pay | Admitting: Urology

## 2024-04-21 NOTE — Telephone Encounter (Signed)
 Would like a returned call. Did not say for what. Read other telephone call. I am assuming he wants to speak with you

## 2024-04-22 ENCOUNTER — Encounter: Payer: Self-pay | Admitting: Urology

## 2024-04-22 NOTE — Telephone Encounter (Signed)
 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  All other biopsies were negative for malignancy. I discussed the biopsy results with the patient today. He is interested in curative therapy and does not wish to continue on active surveillance. Given his prostate volume and lower urinary tract symptoms, I think he would be a reasonable candidate for a RALP. I will ask Dr. Alvaro if he can see him in consultation for RALP.

## 2024-04-23 NOTE — Telephone Encounter (Signed)
 LMOM

## 2024-04-29 ENCOUNTER — Telehealth: Payer: Self-pay | Admitting: Urology

## 2024-04-29 NOTE — Telephone Encounter (Signed)
 Pt called to see the status of his FMLA paperwork. Wants to know if it has been completed and sent off. Please advise.

## 2024-04-30 ENCOUNTER — Emergency Department (HOSPITAL_COMMUNITY)

## 2024-04-30 ENCOUNTER — Encounter: Payer: Self-pay | Admitting: Urology

## 2024-04-30 ENCOUNTER — Encounter (HOSPITAL_COMMUNITY): Payer: Self-pay

## 2024-04-30 ENCOUNTER — Emergency Department (HOSPITAL_COMMUNITY)
Admission: EM | Admit: 2024-04-30 | Discharge: 2024-04-30 | Disposition: A | Discharge status: Home / Self Care | Attending: Emergency Medicine | Admitting: Emergency Medicine

## 2024-04-30 ENCOUNTER — Other Ambulatory Visit: Payer: Self-pay

## 2024-04-30 ENCOUNTER — Telehealth (INDEPENDENT_AMBULATORY_CARE_PROVIDER_SITE_OTHER): Payer: Self-pay

## 2024-04-30 DIAGNOSIS — R55 Syncope and collapse: Secondary | ICD-10-CM | POA: Insufficient documentation

## 2024-04-30 DIAGNOSIS — Z79899 Other long term (current) drug therapy: Secondary | ICD-10-CM | POA: Insufficient documentation

## 2024-04-30 DIAGNOSIS — N410 Acute prostatitis: Secondary | ICD-10-CM | POA: Diagnosis not present

## 2024-04-30 LAB — RESP PANEL BY RT-PCR (RSV, FLU A&B, COVID)  RVPGX2
Influenza A by PCR: NEGATIVE
Influenza B by PCR: NEGATIVE
Resp Syncytial Virus by PCR: NEGATIVE
SARS Coronavirus 2 by RT PCR: NEGATIVE

## 2024-04-30 LAB — CBC WITH DIFFERENTIAL/PLATELET
Abs Immature Granulocytes: 0.12 K/uL — ABNORMAL HIGH (ref 0.00–0.07)
Basophils Absolute: 0.1 K/uL (ref 0.0–0.1)
Basophils Relative: 0 %
Eosinophils Absolute: 0.1 K/uL (ref 0.0–0.5)
Eosinophils Relative: 0 %
HCT: 36.1 % — ABNORMAL LOW (ref 39.0–52.0)
Hemoglobin: 11.3 g/dL — ABNORMAL LOW (ref 13.0–17.0)
Immature Granulocytes: 1 %
Lymphocytes Relative: 12 %
Lymphs Abs: 2.1 K/uL (ref 0.7–4.0)
MCH: 26.8 pg (ref 26.0–34.0)
MCHC: 31.3 g/dL (ref 30.0–36.0)
MCV: 85.5 fL (ref 80.0–100.0)
Monocytes Absolute: 1.3 K/uL — ABNORMAL HIGH (ref 0.1–1.0)
Monocytes Relative: 7 %
Neutro Abs: 13.9 K/uL — ABNORMAL HIGH (ref 1.7–7.7)
Neutrophils Relative %: 80 %
Platelets: 232 K/uL (ref 150–400)
RBC: 4.22 MIL/uL (ref 4.22–5.81)
RDW: 14.8 % (ref 11.5–15.5)
WBC: 17.5 K/uL — ABNORMAL HIGH (ref 4.0–10.5)
nRBC: 0 % (ref 0.0–0.2)

## 2024-04-30 LAB — URINALYSIS, W/ REFLEX TO CULTURE (INFECTION SUSPECTED)
Bilirubin Urine: NEGATIVE
Glucose, UA: NEGATIVE mg/dL
Ketones, ur: NEGATIVE mg/dL
Nitrite: NEGATIVE
Protein, ur: NEGATIVE mg/dL
Specific Gravity, Urine: 1.008 (ref 1.005–1.030)
pH: 6 (ref 5.0–8.0)

## 2024-04-30 LAB — BASIC METABOLIC PANEL WITH GFR
Anion gap: 11 (ref 5–15)
BUN: 14 mg/dL (ref 6–20)
CO2: 23 mmol/L (ref 22–32)
Calcium: 9.5 mg/dL (ref 8.9–10.3)
Chloride: 101 mmol/L (ref 98–111)
Creatinine, Ser: 1.29 mg/dL — ABNORMAL HIGH (ref 0.61–1.24)
GFR, Estimated: >60 mL/min (ref >60)
Glucose, Bld: 115 mg/dL — ABNORMAL HIGH (ref 70–99)
Potassium: 3.2 mmol/L — ABNORMAL LOW (ref 3.5–5.1)
Sodium: 135 mmol/L (ref 135–145)

## 2024-04-30 MED ORDER — SULFAMETHOXAZOLE-TRIMETHOPRIM 800-160 MG PO TABS: 1.0000 | ORAL_TABLET | Freq: Two times a day (BID) | ORAL | 0 refills | Status: AC

## 2024-04-30 MED ORDER — SODIUM CHLORIDE 0.9 % IV SOLN
1.0000 g | Freq: Once | INTRAVENOUS | Status: AC
  Administered 2024-04-30: 1 g via INTRAVENOUS
  Filled 2024-04-30: qty 10

## 2024-04-30 NOTE — Discharge Instructions (Addendum)
 We saw you in the ER for fainting. The workup in the ER is overall reassuring from Cardiac perspective.  We recommend that follow-up with your primary care doctor in 1 week for this fainting spell to see if you need any additional testing.  The workup also reveals elevated white count and some inflammatory findings in your urine.  Given that you just had prostatitis and are complaining of some urinary discomfort, lower quadrant abdominal discomfort and weakness, we suspect you might be having prostatitis.  Please take the antibiotics that are prescribed and discussed the urine findings with the PCP when you follow-up in the clinic.  Strict ER return precautions have been discussed, and patient is agreeing with the plan and is comfortable with the workup done and the recommendations from the ER.

## 2024-04-30 NOTE — Telephone Encounter (Signed)
 Copied from CRM [id number]. Topic: Appointments - Appointment Scheduling >> Apr 30, 2024  3:02 PM Dawna HERO wrote: Patient/patient representative is calling to schedule an appointment. Refer to attachments for appointment information.  No appt sooner than 14 days for hospital f/u,decision tree stated to send crm to admin pool

## 2024-04-30 NOTE — ED Notes (Signed)
 Pt given cup of water

## 2024-04-30 NOTE — ED Triage Notes (Addendum)
 Pt bib EMS from work. At a meeting squatting near floor. Stood up and had syncopal episode. Pt reports they lost consciousness. Denies blood thinners. Says he did hit his head near his right cheek. Orthostatic positive per EMS. Hx Hyperlipoidemia and hypertension.18 RAC. Sitting 112/80 HR 76 RR 16 CBG 147

## 2024-04-30 NOTE — ED Provider Notes (Signed)
 [place] EMERGENCY DEPARTMENT AT [facility] Provider Note   CSN: [id number] Arrival date & time: 04/30/24  9298     Patient presents with: Loss of Consciousness   Jorge Walker is a 58 y.o. male.   HPI     58 year old male comes in with chief complaint of fainting. Patient has no known cardiac disease history.  He states that he went to work today, he was squatting, when he stood up, he fainted.  Patient did not have any warning of fainting.  He fell onto his face, has mild pain over his face, for which she does not want any ice or medications.  He denies any profound headache, vision change, current dizziness, nausea, one-sided weakness or numbness.  Patient has no history of syncope and denies any family history of premature CAD or arrhythmia requiring defibrillator or pacemaker.  Patient denies any substance use disorder. Pt has no hx of PE, DVT and denies any exogenous hormone (testosterone / estrogen) use, long distance travels or surgery in the past 6 weeks, active cancer, recent immobilization.   Patient does indicate that he recently had a prostate biopsy.  He has been put on prostate medications.  Further on patient's stay, he was found to have bacteria in the urine.  Patient states that he has been having some episodes of burning with urination and urinary frequency since biopsy and has constant suprapubic pain.  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   Prior to Admission medications   Medication Sig Start Date End Date Taking? Authorizing Provider  sulfamethoxazole -trimethoprim  (BACTRIM  DS) 800-160 MG tablet Take 1 tablet by mouth 2 (two) times daily for 14 days. 04/30/24 05/14/24 Yes Charlyn Sora, MD  atorvastatin  (LIPITOR) 40 MG tablet TAKE 1 TABLET BY MOUTH EVERY DAY 02/13/24   Celestia Rosaline SQUIBB, NP  olmesartan -hydrochlorothiazide (BENICAR  HCT) 20-12.5 MG tablet TAKE 1 TABLET BY MOUTH  EVERY DAY 03/22/24   Celestia Rosaline SQUIBB, NP  olopatadine  (PATANOL) 0.1 % ophthalmic solution Place 1 drop into both eyes 2 (two) times daily. 04/21/23   Celestia Rosaline SQUIBB, NP  tamsulosin  (FLOMAX ) 0.4 MG CAPS capsule Take 1 capsule (0.4 mg total) by mouth daily after supper. 04/05/24   Stoneking, Adine PARAS., MD    Allergies: Amlodipine     Review of Systems  All other systems reviewed and are negative.   Updated Vital Signs BP 99/70   Pulse 70   Temp 98.7 F (37.1 C) (Oral)   Resp (!) 22   Ht 5' 9 (1.753 m)   Wt 82.1 kg   SpO2 97%   BMI 26.73 kg/m   Physical Exam Vitals and nursing note reviewed.  Constitutional:      Appearance: He is well-developed.  HENT:     Head: Atraumatic.  Eyes:     Extraocular Movements: Extraocular movements intact.     Pupils: Pupils are equal, round, and reactive to light.  Cardiovascular:     Rate and Rhythm: Normal rate.  Pulmonary:     Effort: Pulmonary effort is normal.  Musculoskeletal:     Cervical back: Neck supple.     Right lower leg: No edema.     Left lower leg: No edema.  Skin:    General: Skin is warm.  Neurological:     Mental Status: He is alert and oriented to person, place, and time.     (all labs ordered are listed,  but only abnormal results are displayed) Labs Reviewed  CBC WITH DIFFERENTIAL/PLATELET - Abnormal; Notable for the following components:      Result Value   WBC 17.5 (*)    Hemoglobin 11.3 (*)    HCT 36.1 (*)    Neutro Abs 13.9 (*)    Monocytes Absolute 1.3 (*)    Abs Immature Granulocytes 0.12 (*)    All other components within normal limits  URINALYSIS, W/ REFLEX TO CULTURE (INFECTION SUSPECTED) - Abnormal; Notable for the following components:   Hgb urine dipstick SMALL (*)    Leukocytes,Ua SMALL (*)    Bacteria, UA MANY (*)    All other components within normal limits  BASIC METABOLIC PANEL WITH GFR - Abnormal; Notable for the following components:   Potassium 3.2 (*)    Glucose, Bld 115 (*)     Creatinine, Ser 1.29 (*)    All other components within normal limits  RESP PANEL BY RT-PCR (RSV, FLU A&B, COVID)  [id number]  URINE CULTURE    EKG: EKG Interpretation Date/Time:  Friday April 30 2024 07:10:26 EDT Ventricular Rate:  65 PR Interval:  145 QRS Duration:  93 QT Interval:  356 QTC Calculation: 371 R Axis:   65  Text Interpretation: Sinus rhythm Nonspecific T abnormalities, lateral leads No acute changes No significant change since last tracing Confirmed by Charlyn Sora ([id number]) on 04/30/2024 9:39:54 AM  Radiology: [facility] Chest 2 View Result Date: 04/30/2024 EXAM: 2 VIEW(S) XRAY OF THE CHEST 04/30/2024 09:42:00 AM COMPARISON: PA and lateral radiographs of chest dated 12/01/2017. CLINICAL HISTORY: Eval for PNA. Concern for PNA. FINDINGS: LUNGS AND PLEURA: No focal pulmonary opacity. No pulmonary edema. No pleural effusion. No pneumothorax. HEART AND MEDIASTINUM: No acute abnormality of the cardiac and mediastinal silhouettes. BONES AND SOFT TISSUES: Mild dextrocurvature of the thoracolumbar spine. No acute osseous abnormality. IMPRESSION: 1. No acute process. Electronically signed by: Evalene Coho MD 04/30/2024 09:47 AM EDT RP Workstation: [id number]     Procedures   Medications Ordered in the ED  cefTRIAXone  (ROCEPHIN ) 1 g in sodium chloride  0.9 % 100 mL IVPB (1 g Intravenous New Bag/Given 04/30/24 1052)                                    Medical Decision Making Amount and/or Complexity of Data Reviewed Labs: ordered. Radiology: ordered.  Risk Prescription drug management.   This patient presents to the ED with chief complaint(s) of fainting with pertinent past medical history of no cardiac disease history, recent prostate biopsy, hypertension.  Family history, social history is reassuring.  Of note, patient had prostate biopsy recently and is complaining of generalized malaise/weakness and also suprapubic abdominal discomfort, urinary frequency and  discomfort.The complaint involves an extensive differential diagnosis and also carries with it a high risk of complications and morbidity.    The differential diagnosis includes  Orthostatic hypotension Medication side effects Dehydration Stroke Vertebral artery dissection/stenosis Dysrhythmia PE Vasovagal/neurocardiogenic syncope Aortic stenosis Valvular disorder/Cardiomyopathy Anemia  Given the malaise, recent prostate biopsy -prostatitis, cystitis are also in the differential along with viral illness such as COVID.  The initial plan is to get basic labs, cardiac monitoring, EKG, UA with culture and COVID-19 test.   Independent labs interpretation:  The following labs were independently interpreted: Patient's UA has pyuria, leukocytes and bacteriuria.  In this clinical setting, prostatitis is a possibility.  Will put him on Bactrim  for 14 days.  Otherwise no anemia or profound dehydration/electrolyte abnormality.  Independent visualization and interpretation of imaging: - I independently visualized the following imaging with scope of interpretation limited to determining acute life threatening conditions related to emergency care: X-ray of the chest, which revealed no evidence of CHF.  Treatment and Reassessment: I have reviewed patient's cardiac telemonitoring strip independently.  There has not been any arrhythmia.  The patient appears reasonably screened and/or stabilized for discharge and I doubt any other medical condition or other [facility] requiring further screening, evaluation, or treatment in the ED at this time prior to discharge.   Results from the ER workup discussed with the patient face to face and all questions answered to the best of my ability.  I have advised that he follows up with his PCP for the syncope and the urinary symptoms. The patient is safe for discharge with strict return precautions.    Final diagnoses:  Syncope and collapse  Acute prostatitis     ED Discharge Orders          Ordered    sulfamethoxazole -trimethoprim  (BACTRIM  DS) 800-160 MG tablet  2 times daily        04/30/24 1109               Charlyn Sora, MD 04/30/24 1126

## 2024-05-03 ENCOUNTER — Telehealth: Payer: Self-pay | Admitting: Urology

## 2024-05-03 LAB — URINE CULTURE: Culture: 90000 — AB

## 2024-05-03 NOTE — Telephone Encounter (Signed)
 Saw mychart message. Calling us  back to answer questions regarding paperwork.

## 2024-05-04 ENCOUNTER — Telehealth (HOSPITAL_BASED_OUTPATIENT_CLINIC_OR_DEPARTMENT_OTHER): Payer: Self-pay

## 2024-05-04 NOTE — Progress Notes (Signed)
 ED Antimicrobial Stewardship Positive Culture Follow Up   Jorge Walker is an 58 y.o. male who presented to [facility] on 04/30/2024 with a chief complaint of syncope. He endorsed burning w/ urination, urinary frequency, & suprapubic pain.   Chief Complaint  Patient presents with   Loss of Consciousness    Recent Results (from the past 720 hours)  Urine Culture     Status: Abnormal   Collection Time: 04/30/24  8:24 AM   Specimen: Urine, Random  Result Value Ref Range Status   Specimen Description   Final    URINE, RANDOM Performed at [facility], [address], [place], [place] [address]    Special Requests   Final    NONE Reflexed from [id number] Performed at [facility], [address] [place], [place] [address]    Culture 90,000 COLONIES/mL ESCHERICHIA COLI (A)  Final   Report Status 05/03/2024 FINAL  Final   Organism ID, Bacteria ESCHERICHIA COLI (A)  Final      Susceptibility   Escherichia coli - MIC*    AMPICILLIN >=32 RESISTANT Resistant     CEFAZOLIN  (URINE) Value in next row Sensitive      4 SENSITIVEThis is a modified FDA-approved test that has been validated and its performance characteristics determined by the reporting laboratory.  This laboratory is certified under the Clinical Laboratory Improvement Amendments CLIA as qualified to perform high complexity clinical laboratory testing.    CEFEPIME Value in next row Sensitive      4 SENSITIVEThis is a modified FDA-approved test that has been validated and its performance characteristics determined by the reporting laboratory.  This laboratory is certified under the Clinical Laboratory Improvement Amendments CLIA as qualified to perform high complexity clinical laboratory testing.    ERTAPENEM Value in next row Sensitive      4 SENSITIVEThis is a modified FDA-approved test that has been validated and its performance characteristics determined by the reporting laboratory.  This  laboratory is certified under the Clinical Laboratory Improvement Amendments CLIA as qualified to perform high complexity clinical laboratory testing.    CEFTRIAXONE  Value in next row Sensitive      4 SENSITIVEThis is a modified FDA-approved test that has been validated and its performance characteristics determined by the reporting laboratory.  This laboratory is certified under the Clinical Laboratory Improvement Amendments CLIA as qualified to perform high complexity clinical laboratory testing.    CIPROFLOXACIN Value in next row Resistant      4 SENSITIVEThis is a modified FDA-approved test that has been validated and its performance characteristics determined by the reporting laboratory.  This laboratory is certified under the Clinical Laboratory Improvement Amendments CLIA as qualified to perform high complexity clinical laboratory testing.    GENTAMICIN Value in next row Sensitive      4 SENSITIVEThis is a modified FDA-approved test that has been validated and its performance characteristics determined by the reporting laboratory.  This laboratory is certified under the Clinical Laboratory Improvement Amendments CLIA as qualified to perform high complexity clinical laboratory testing.    NITROFURANTOIN Value in next row Sensitive      4 SENSITIVEThis is a modified FDA-approved test that has been validated and its performance characteristics determined by the reporting laboratory.  This laboratory is certified under the Clinical Laboratory Improvement Amendments CLIA as qualified to perform high complexity clinical laboratory testing.    TRIMETH /SULFA  Value in next row Resistant      4 SENSITIVEThis is a modified FDA-approved  test that has been validated and its performance characteristics determined by the reporting laboratory.  This laboratory is certified under the Clinical Laboratory Improvement Amendments CLIA as qualified to perform high complexity clinical laboratory testing.     AMPICILLIN/SULBACTAM Value in next row Intermediate      4 SENSITIVEThis is a modified FDA-approved test that has been validated and its performance characteristics determined by the reporting laboratory.  This laboratory is certified under the Clinical Laboratory Improvement Amendments CLIA as qualified to perform high complexity clinical laboratory testing.    PIP/TAZO Value in next row Sensitive ug/mL     <=4 SENSITIVEThis is a modified FDA-approved test that has been validated and its performance characteristics determined by the reporting laboratory.  This laboratory is certified under the Clinical Laboratory Improvement Amendments CLIA as qualified to perform high complexity clinical laboratory testing.    MEROPENEM Value in next row Sensitive      <=4 SENSITIVEThis is a modified FDA-approved test that has been validated and its performance characteristics determined by the reporting laboratory.  This laboratory is certified under the Clinical Laboratory Improvement Amendments CLIA as qualified to perform high complexity clinical laboratory testing.    * 90,000 COLONIES/mL ESCHERICHIA COLI  Resp panel by RT-PCR (RSV, Flu A&B, Covid) Anterior Nasal Swab     Status: None   Collection Time: 04/30/24  8:39 AM   Specimen: Anterior Nasal Swab  Result Value Ref Range Status   SARS Coronavirus 2 by RT PCR NEGATIVE NEGATIVE Final    Comment: (NOTE) SARS-CoV-2 target nucleic acids are NOT DETECTED.  The SARS-CoV-2 RNA is generally detectable in upper respiratory specimens during the acute phase of infection. The lowest concentration of SARS-CoV-2 viral copies this assay can detect is 138 copies/mL. A negative result does not preclude SARS-Cov-2 infection and should not be used as the sole basis for treatment or other patient management decisions. A negative result may occur with  improper specimen collection/handling, submission of specimen other than nasopharyngeal swab, presence of viral  mutation(s) within the areas targeted by this assay, and inadequate number of viral copies(<138 copies/mL). A negative result must be combined with clinical observations, patient history, and epidemiological information. The expected result is Negative.  Fact Sheet for Patients:  BloggerCourse.com  Fact Sheet for Healthcare Providers:  SeriousBroker.it  This test is no t yet approved or cleared by the [place]  FDA and  has been authorized for detection and/or diagnosis of SARS-CoV-2 by FDA under an Emergency Use Authorization (EUA). This EUA will remain  in effect (meaning this test can be used) for the duration of the COVID-19 declaration under Section 564(b)(1) of the Act, 21 U.S.C.section 360bbb-3(b)(1), unless the authorization is terminated  or revoked sooner.       Influenza A by PCR NEGATIVE NEGATIVE Final   Influenza B by PCR NEGATIVE NEGATIVE Final    Comment: (NOTE) The Xpert Xpress SARS-CoV-2/FLU/RSV plus assay is intended as an aid in the diagnosis of influenza from Nasopharyngeal swab specimens and should not be used as a sole basis for treatment. Nasal washings and aspirates are unacceptable for Xpert Xpress SARS-CoV-2/FLU/RSV testing.  Fact Sheet for Patients: BloggerCourse.com  Fact Sheet for Healthcare Providers: SeriousBroker.it  This test is not yet approved or cleared by the [place]  FDA and has been authorized for detection and/or diagnosis of SARS-CoV-2 by FDA under an Emergency Use Authorization (EUA). This EUA will remain in effect (meaning this test can be used) for the duration of the COVID-19 declaration  under Section 564(b)(1) of the Act, 21 U.S.C. section 360bbb-3(b)(1), unless the authorization is terminated or revoked.     Resp Syncytial Virus by PCR NEGATIVE NEGATIVE Final    Comment: (NOTE) Fact Sheet for  Patients: BloggerCourse.com  Fact Sheet for Healthcare Providers: SeriousBroker.it  This test is not yet approved or cleared by the [place]  FDA and has been authorized for detection and/or diagnosis of SARS-CoV-2 by FDA under an Emergency Use Authorization (EUA). This EUA will remain in effect (meaning this test can be used) for the duration of the COVID-19 declaration under Section 564(b)(1) of the Act, 21 U.S.C. section 360bbb-3(b)(1), unless the authorization is terminated or revoked.  Performed at [facility], [address], [place], [place] [address]     [x]  Treated with Bactrim , organism resistant to prescribed antimicrobial []  Patient discharged originally without antimicrobial agent and treatment is now indicated  New antibiotic prescription: Cefuroxime 500mg  every 8 hours x14 days and counsel the patient to follow up with PCP or Urology.  Preferred agents for this case would include flouroquinolones and Bactrim , however this pathogen is resistant to both. Some clinical data show Cefuroxime could be efficacious, and this pathogen is sensitive to cefazolin , the surrogate marker for oral cephalosporins. Would recommend counseling the patient to follow up with PCP or Urology.     ED Provider: [name], PA   References:  Ronette LITTIE Gaspar KATHEE Kenna CHRISTELLA Cruzita DELENA Francene CHRISTELLA, Martnez-Urrea A, Boix-Palop L, Calbo E. Clinical Outcomes of Escherichia coli Acute Bacterial Prostatitis: A Comparative Study of Oral Sequential Therapy with ?-Lactam Versus Quinolone Antibiotics. Antibiotics [facility]). 2025 Jul 5;14(7):681. doi: 10.3390/[id number]. PMID: 59276015; PMCID: [id number].   Morene A. Charolett, Ivor Shery Bruckner T. Hoey, Treatment of Bacterial Prostatitis, Clinical Infectious Diseases, Volume 50, Issue 12, 07 February 2009, Pages 8358-8347, SimpleSpeech.is  Dionicia Canavan, PharmD,  RPh PGY1 Acute Care Pharmacy Resident [facility]  05/04/2024 8:38 AM  Clinical Pharmacist Monday - Friday phone -  [phone] Saturday - Sunday phone - [phone]

## 2024-05-04 NOTE — Telephone Encounter (Signed)
 Post ED Visit - Positive Culture Follow-up: Unsuccessful Patient Follow-up  Culture assessed and recommendations reviewed by:  [x]  Vanna Labi, Pharm.D. []  Venetia Gully, Pharm.D., BCPS AQ-ID []  Garrel Crews, Pharm.D., BCPS []  Almarie Lunger, Pharm.D., BCPS []  [place], [address].D., BCPS, AAHIVP []  Rosaline Bihari, Pharm.D., BCPS, AAHIVP []  Massie Rigg, PharmD []  Jodie Rower, PharmD, BCPS  Positive urine culture  []  Patient discharged without antimicrobial prescription and treatment is now indicated [x]  Organism is resistant to prescribed ED discharge antimicrobial []  Patient with positive blood cultures  Plan stop Bactrim  and start Cefuroxime 500 mg po Q 8 hrs x 14 days per ED provider [name], PA    Unable to contact patient after 3 attempts, letter will be sent to address on file  Jorge Walker 05/04/2024, 10:50 AM

## 2024-05-05 NOTE — Telephone Encounter (Signed)
 Forms have been filled out and faxed

## 2024-05-10 ENCOUNTER — Telehealth (INDEPENDENT_AMBULATORY_CARE_PROVIDER_SITE_OTHER): Payer: Self-pay | Admitting: Primary Care

## 2024-05-10 NOTE — Telephone Encounter (Signed)
 Called pt to reschedule appt. Pt did not answer

## 2024-05-11 ENCOUNTER — Inpatient Hospital Stay (INDEPENDENT_AMBULATORY_CARE_PROVIDER_SITE_OTHER): Admitting: Primary Care

## 2024-05-11 ENCOUNTER — Telehealth: Payer: Self-pay | Admitting: Urology

## 2024-05-11 NOTE — Telephone Encounter (Signed)
 Pt called stated that his FMLA paperwork has still not been received  by his employer can we please check on and verify. Please Advise.

## 2024-05-12 ENCOUNTER — Encounter (INDEPENDENT_AMBULATORY_CARE_PROVIDER_SITE_OTHER): Payer: Self-pay

## 2024-05-13 NOTE — Telephone Encounter (Signed)
 Spoke with insurance company. Forms were received but not approved by the company.

## 2024-05-21 ENCOUNTER — Telehealth: Payer: Self-pay

## 2024-05-21 NOTE — Telephone Encounter (Signed)
 Spoke with Elspeth at [place]. Elspeth stated that he has been getting all fax that have been sent over. Made Elspeth aware that all known information has been sent to them. Also made Elspeth aware a doctors note has been offered to pt to cover him for the 2 missed days of work, but pt has declined. Elspeth voiced understanding. Made Elspeth aware can send more information from Dr. Roseann as it is available but as of now, [employer] has all known information.

## 2024-05-26 ENCOUNTER — Telehealth (INDEPENDENT_AMBULATORY_CARE_PROVIDER_SITE_OTHER): Payer: Self-pay | Admitting: Primary Care

## 2024-05-26 NOTE — Telephone Encounter (Signed)
 Called pt to reschedule appt. Pt did not answer but LVM to call back so we can schedule.

## 2024-05-31 ENCOUNTER — Ambulatory Visit (INDEPENDENT_AMBULATORY_CARE_PROVIDER_SITE_OTHER): Admitting: Primary Care

## 2024-06-04 ENCOUNTER — Other Ambulatory Visit: Payer: Self-pay | Admitting: Urology

## 2024-06-04 DIAGNOSIS — C61 Malignant neoplasm of prostate: Secondary | ICD-10-CM

## 2024-06-06 ENCOUNTER — Ambulatory Visit (HOSPITAL_COMMUNITY)
Admission: EM | Admit: 2024-06-06 | Discharge: 2024-06-06 | Disposition: A | Discharge status: Home / Self Care | Attending: Emergency Medicine | Admitting: Emergency Medicine

## 2024-06-06 ENCOUNTER — Other Ambulatory Visit: Payer: Self-pay

## 2024-06-06 ENCOUNTER — Encounter (HOSPITAL_COMMUNITY): Payer: Self-pay | Admitting: *Deleted

## 2024-06-06 DIAGNOSIS — J329 Chronic sinusitis, unspecified: Secondary | ICD-10-CM | POA: Diagnosis not present

## 2024-06-06 DIAGNOSIS — H6593 Unspecified nonsuppurative otitis media, bilateral: Secondary | ICD-10-CM | POA: Diagnosis not present

## 2024-06-06 DIAGNOSIS — B9689 Other specified bacterial agents as the cause of diseases classified elsewhere: Secondary | ICD-10-CM

## 2024-06-06 MED ORDER — AZELASTINE HCL 0.1 % NA SOLN: 2.0000 | Freq: Two times a day (BID) | NASAL | 0 refills | Status: DC

## 2024-06-06 MED ORDER — CETIRIZINE HCL 10 MG PO TABS: 10.0000 mg | ORAL_TABLET | Freq: Every day | ORAL | 0 refills | Status: DC

## 2024-06-06 MED ORDER — AMOXICILLIN-POT CLAVULANATE 875-125 MG PO TABS: 1.0000 | ORAL_TABLET | Freq: Two times a day (BID) | ORAL | 0 refills | Status: DC

## 2024-06-06 NOTE — ED Provider Notes (Signed)
 [facility]    CSN: [id number] Arrival date & time: 06/06/24  1006      History   Chief Complaint Chief Complaint  Patient presents with   Ear Fullness    HPI Jorge Walker is a 58 y.o. male.   Patient presents with bilateral ear fullness over the last few days.  Patient states that he feels like his left is worse than his right.  Patient states that he has been having similar significant nasal congestion and sinus pressure over the last week as well.  Denies fever, bodies, chills.  Denies cough, shortness of breath, and chest pain.  Patient denies taking any medication for his symptoms.  The history is provided by the patient and medical records.  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   Benign prostatic hyperplasia with urinary frequency 09/05/2020   Chronic right-sided low back pain 09/03/2019   Abnormal blood cell count 07/30/2019   Gross hematuria 07/30/2019   Prostatitis 07/30/2019   Elevated blood-pressure reading without diagnosis of hypertension 07/30/2019   Elevated PSA 04/02/2019   Drop in hemoglobin 04/02/2019   Vaccine counseling 02/03/2019   Abnormal EKG 11/28/2017   Polyarthralgia 11/28/2017   Encounter for health maintenance examination in adult 01/02/2016   Special screening for malignant neoplasms, colon 01/02/2016   Screening for prostate cancer 01/02/2016   Benign prostatic hyperplasia 01/02/2016   Nocturia 01/02/2016   Lipoma of extremity 01/02/2016   Epididymal cyst 07/07/2008   Smoker 04/28/2008    Past Surgical History:  Procedure Laterality Date   ARM WOUND REPAIR / CLOSURE     right upper posterior arm - teenager,  s/p trauma   ARTERY REPAIR     right upper arm - teenager, s/p trauma to right upper arm   COLONOSCOPY  01/24/2016   Diverticulosis in left and right colon.   Repeat 10 years.  Dr. Norleen Abran   ELBOW SURGERY  2019       Home Medications    Prior to Admission medications   Medication Sig Start Date End Date Taking? Authorizing Provider  amoxicillin -clavulanate (AUGMENTIN ) 875-125 MG tablet Take 1 tablet by mouth every 12 (twelve) hours. 06/06/24  Yes Johnie, Meghan Tiemann A, NP  atorvastatin  (LIPITOR) 40 MG tablet TAKE 1 TABLET BY MOUTH EVERY DAY 02/13/24  Yes Celestia Rosaline SQUIBB, NP  azelastine (ASTELIN) 0.1 % nasal spray Place 2 sprays into both nostrils 2 (two) times daily. Use in each nostril as directed 06/06/24  Yes Johnie Flaming A, NP  cetirizine (ZYRTEC ALLERGY) 10 MG tablet Take 1 tablet (10 mg total) by mouth daily. 06/06/24  Yes Johnie, Josephene Marrone A, NP  olmesartan -hydrochlorothiazide (BENICAR  HCT) 20-12.5 MG tablet TAKE 1 TABLET BY MOUTH EVERY DAY 03/22/24  Yes Celestia Rosaline SQUIBB, NP  olopatadine  (PATANOL) 0.1 % ophthalmic solution Place 1 drop into both eyes 2 (two) times daily. 04/21/23  Yes Celestia Rosaline SQUIBB, NP  tamsulosin  (FLOMAX ) 0.4 MG CAPS capsule Take 1 capsule (0.4 mg total) by mouth daily after supper. 04/05/24  Yes Stoneking, Adine PARAS., MD    Family History Family History  Problem Relation Age of Onset   Hypertension Mother    Diabetes Mother    Pneumonia Father        died of pneumonia   Cancer Neg Hx    Heart disease Neg Hx    Stroke Neg Hx    Colon cancer Neg Hx    Colon polyps Neg Hx    Esophageal cancer Neg Hx    Rectal cancer Neg Hx    Stomach cancer Neg Hx     Social History Social History   Tobacco Use   Smoking status: Every Day    Current packs/day: 0.50    Average packs/day: 0.5 packs/day for 17.0 years (8.5 ttl pk-yrs)    Types: Cigarettes   Smokeless tobacco: Never  Vaping Use   Vaping status: Never Used  Substance Use Topics    Alcohol use: Yes    Alcohol/week: 5.0 standard drinks of alcohol    Types: 5 Shots of liquor per week    Comment: weekends    Drug use: No     Allergies   Amlodipine    Review of Systems Review of Systems  Per HPI  Physical Exam Triage Vital Signs ED Triage Vitals  Encounter Vitals Group     BP 06/06/24 1048 108/73     Girls Systolic BP Percentile --      Girls Diastolic BP Percentile --      Boys Systolic BP Percentile --      Boys Diastolic BP Percentile --      Pulse Rate 06/06/24 1048 (!) 57     Resp 06/06/24 1048 18     Temp 06/06/24 1048 98.2 F (36.8 C)     Temp src --      SpO2 06/06/24 1048 95 %     Weight --      Height --      Head Circumference --      Peak Flow --      Pain Score 06/06/24 1047 7     Pain Loc --      Pain Education --      Exclude from Growth Chart --    No data found.  Updated Vital Signs BP 108/73   Pulse (!) 57   Temp 98.2 F (36.8 C)   Resp 18   SpO2 95%   Visual Acuity Right Eye Distance:   Left Eye Distance:   Bilateral Distance:    Right Eye Near:   Left Eye Near:    Bilateral Near:     Physical Exam Vitals and nursing note reviewed.  Constitutional:      General: He is awake. He is not in acute distress.    Appearance: Normal appearance. He is well-developed and well-groomed. He is not ill-appearing.  HENT:     Right Ear: Ear canal and external ear normal. A middle ear effusion is present.     Left Ear: Ear canal and external ear normal. A middle ear effusion is present. Tympanic membrane is erythematous.     Nose: Congestion present.     Right Sinus: Maxillary sinus tenderness present.     Left Sinus: Maxillary sinus tenderness present.  Neurological:     Mental Status: He is alert.  Psychiatric:        Behavior: Behavior is cooperative.      UC Treatments / Results  Labs (all labs ordered are listed, but only abnormal results are displayed) Labs Reviewed - No data to  display  EKG  Radiology No results found.  Procedures Procedures (including critical care time)  Medications Ordered in UC Medications - No data to display  Initial Impression / Assessment and Plan / UC Course  I have reviewed the triage vital signs and the nursing notes.  Pertinent labs & imaging results that were available during my care of the patient were reviewed by me and considered in my medical decision making (see chart for details).     Patient is overall well-appearing.  Vitals are stable.  Exam findings consistent with bacterial sinusitis with bilateral middle ear effusions.  Prescribed Augmentin  for sinusitis coverage.  Prescribed azelastine and sertraline to assist with symptoms as well as middle ear effusion.  Discussed follow-up and return precautions. Final Clinical Impressions(s) / UC Diagnoses   Final diagnoses:  Bacterial sinusitis  Fluid level behind tympanic membrane of both ears     Discharge Instructions      Start taking Augmentin  twice daily for 7 days for sinus infection. Use azelastine nasal spray twice daily to help with congestion as well as drainage of fluid behind your eardrums. You can also start taking cetirizine once daily to help with this as well. Follow-up with your primary care provider or return here as needed.   ED Prescriptions     Medication Sig Dispense Auth. Provider   amoxicillin -clavulanate (AUGMENTIN ) 875-125 MG tablet Take 1 tablet by mouth every 12 (twelve) hours. 14 tablet Johnie, Moreen Piggott A, NP   azelastine (ASTELIN) 0.1 % nasal spray Place 2 sprays into both nostrils 2 (two) times daily. Use in each nostril as directed 30 mL Johnie Flaming A, NP   cetirizine (ZYRTEC ALLERGY) 10 MG tablet Take 1 tablet (10 mg total) by mouth daily. 30 tablet Johnie Flaming A, NP      PDMP not reviewed this encounter.   Johnie Flaming A, NP 06/06/24 1128

## 2024-06-06 NOTE — Discharge Instructions (Signed)
 Start taking Augmentin  twice daily for 7 days for sinus infection. Use azelastine nasal spray twice daily to help with congestion as well as drainage of fluid behind your eardrums. You can also start taking cetirizine once daily to help with this as well. Follow-up with your primary care provider or return here as needed.

## 2024-06-06 NOTE — ED Triage Notes (Signed)
 PT reports he feels like his ears are stopped up LT worse than RT. Pt has been getting over a sinus infection but his ear

## 2024-06-10 ENCOUNTER — Telehealth: Payer: Self-pay | Admitting: Urology

## 2024-06-10 NOTE — Telephone Encounter (Signed)
-----   Message from Adine Manly sent at 06/02/2024 11:46 AM EDT ----- Regarding: RE: Patient Advice Please schedule for f/u appt to discuss these issues. ----- Message ----- From: Maranda Alan SAILOR Sent: 06/02/2024  11:33 AM EDT To: Adine DOROTHA Manly, MD Subject: Patient Advice                                 Patient called requesting to have his PSA rechecked and he wants to discuss radiation treatment. Please advise on what steps you would like me to take next. Thank You.

## 2024-06-10 NOTE — Telephone Encounter (Signed)
 Called pt back and lvm to schedule

## 2024-06-16 ENCOUNTER — Other Ambulatory Visit (INDEPENDENT_AMBULATORY_CARE_PROVIDER_SITE_OTHER): Payer: Self-pay | Admitting: Primary Care

## 2024-06-16 DIAGNOSIS — I10 Essential (primary) hypertension: Secondary | ICD-10-CM

## 2024-06-17 ENCOUNTER — Ambulatory Visit: Admitting: Urology

## 2024-06-17 ENCOUNTER — Encounter: Payer: Self-pay | Admitting: Urology

## 2024-06-17 VITALS — BP 97/62 | HR 59 | Ht 70.0 in | Wt 172.0 lb

## 2024-06-17 DIAGNOSIS — C61 Malignant neoplasm of prostate: Secondary | ICD-10-CM

## 2024-06-17 LAB — MICROSCOPIC EXAMINATION

## 2024-06-17 LAB — URINALYSIS, ROUTINE W REFLEX MICROSCOPIC
Bilirubin, UA: NEGATIVE
Glucose, UA: NEGATIVE
Ketones, UA: NEGATIVE
Nitrite, UA: NEGATIVE
Protein,UA: NEGATIVE
RBC, UA: NEGATIVE
Specific Gravity, UA: 1.02 (ref 1.005–1.030)
Urobilinogen, Ur: 1 mg/dL (ref 0.2–1.0)
pH, UA: 6.5 (ref 5.0–7.5)

## 2024-06-17 NOTE — Progress Notes (Addendum)
 Assessment: 1. 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  He is interested in definitive treatment. Given his prostate volume and lower urinary tract symptoms, I think he would be a reasonable candidate for a RALP. I advised him that radiation therapy would likely make his lower urinary tract symptoms worse. Referral to Dr. Alvaro at [facility] to discuss RALP.  Chief Complaint: Chief Complaint  Patient presents with   Prostate Cancer    HPI: Jorge Walker is a 58 y.o. male who presents for continued evaluation of low risk prostate cancer. He was previously followed by Dr. Shona and was last seen in March 2025.  Prostate cancer summary: Initial diagnosis: TRUS/BX 02/2023 Pathology: Gleason 3+3 = 6 in 1 of 12 cores (left lateral apex) with 13% core involvement Prostate volume = 60 mL PSA 01/2023 = 5.8 Clinical stage T1c/DRE:  nst, prostate large 60gm with mild symmetry R greater than L.  NO focal induration or nodularity    GPS= 12 (ave for group= 24)    Multiparametric prostate MRI 05/09/2023-- IMPRESSION: 1. Two PI-RADS category 3 lesions are identified in the peripheral zone. Targeting data sent to [facility]. 2. Prostatomegaly and benign prostatic hypertrophy. 3. Central pelvic kidney. 4.  Prostate Volume= 87.4 ml  He was doing well at his visit in March 2025.  He continued on active surveillance. PSA 3/25:  7.0 PSA 7/25:  6.8  Fusion guided biopsy from 04/13/2024 showed Gleason 3+3 = 6 adenocarcinoma in the right apex.  All other biopsies were negative for malignancy.  He returns today for follow-up.  He continues on tamsulosin  0.4 mg daily.  He has lower urinary tract symptoms including urgency, incomplete emptying, and some decrease stream and intermittent stream.  No dysuria  or gross hematuria. IPSS = 10/6.  Portions of the above documentation were copied from a prior visit for review purposes only.  Allergies: Allergies  Allergen Reactions   Amlodipine  Swelling    ALL    PMH: Past Medical History:  Diagnosis Date   Allergy    Arthritis    finger   BPH (benign prostatic hypertrophy) 2020   BPH with elevated PSA   Epididymal cyst    Hypertension    Tobacco use disorder     PSH: Past Surgical History:  Procedure Laterality Date   ARM WOUND REPAIR / CLOSURE     right upper posterior arm - teenager, s/p trauma   ARTERY REPAIR     right upper arm - teenager, s/p trauma to right upper arm   COLONOSCOPY  01/24/2016   Diverticulosis in left and right colon.   Repeat 10 years.  Dr. Norleen Abran   ELBOW SURGERY  2019    SH: Social History   Tobacco Use   Smoking status: Every Day    Current packs/day: 0.50    Average packs/day: 0.5 packs/day for 17.0 years (8.5 ttl pk-yrs)    Types: Cigarettes   Smokeless tobacco: Never  Vaping Use   Vaping status: Never Used  Substance Use Topics   Alcohol use: Yes    Alcohol/week: 5.0 standard drinks of alcohol    Types: 5 Shots of liquor per week    Comment: weekends    Drug use: No    ROS: Constitutional:  Negative for fever, chills, weight loss CV: Negative for chest  pain, previous MI, hypertension Respiratory:  Negative for shortness of breath, wheezing, sleep apnea, frequent cough GI:  Negative for nausea, vomiting, bloody stool, GERD  PE: BP 97/62   Pulse (!) 59   Ht 5' 10 (1.778 m)   Wt 172 lb (78 kg)   BMI 24.68 kg/m  GENERAL APPEARANCE:  Well appearing, well developed, well nourished, NAD HEENT:  Atraumatic, normocephalic, oropharynx clear NECK:  Supple without lymphadenopathy or thyromegaly ABDOMEN:  Soft, non-tender, no masses EXTREMITIES:  Moves all extremities well, without clubbing, cyanosis, or edema NEUROLOGIC:  Alert and oriented x 3, normal gait, CN II-XII grossly  intact MENTAL STATUS:  appropriate BACK:  Non-tender to palpation, No CVAT SKIN:  Warm, dry, and intact   Results: U/A: 0-5 WBC, 0-2 RBC

## 2024-06-30 ENCOUNTER — Encounter (INDEPENDENT_AMBULATORY_CARE_PROVIDER_SITE_OTHER): Admitting: Primary Care

## 2024-07-01 ENCOUNTER — Encounter (INDEPENDENT_AMBULATORY_CARE_PROVIDER_SITE_OTHER): Payer: Self-pay | Admitting: Primary Care

## 2024-07-01 ENCOUNTER — Ambulatory Visit (INDEPENDENT_AMBULATORY_CARE_PROVIDER_SITE_OTHER): Admitting: Primary Care

## 2024-07-01 VITALS — BP 104/68 | HR 66 | Resp 16 | Ht 70.0 in | Wt 181.2 lb

## 2024-07-01 DIAGNOSIS — E782 Mixed hyperlipidemia: Secondary | ICD-10-CM

## 2024-07-01 DIAGNOSIS — Z0001 Encounter for general adult medical examination with abnormal findings: Secondary | ICD-10-CM

## 2024-07-01 DIAGNOSIS — Z789 Other specified health status: Secondary | ICD-10-CM

## 2024-07-01 DIAGNOSIS — Z0000 Encounter for general adult medical examination without abnormal findings: Secondary | ICD-10-CM

## 2024-07-01 DIAGNOSIS — F17210 Nicotine dependence, cigarettes, uncomplicated: Secondary | ICD-10-CM

## 2024-07-01 DIAGNOSIS — Z23 Encounter for immunization: Secondary | ICD-10-CM

## 2024-07-01 DIAGNOSIS — Z1159 Encounter for screening for other viral diseases: Secondary | ICD-10-CM

## 2024-07-01 DIAGNOSIS — I10 Essential (primary) hypertension: Secondary | ICD-10-CM | POA: Diagnosis not present

## 2024-07-01 DIAGNOSIS — H539 Unspecified visual disturbance: Secondary | ICD-10-CM | POA: Diagnosis not present

## 2024-07-01 DIAGNOSIS — F17200 Nicotine dependence, unspecified, uncomplicated: Secondary | ICD-10-CM

## 2024-07-01 NOTE — Progress Notes (Signed)
 Complete physical exam  Patient: Jorge Walker   DOB: Jun 26, 1966   58 y.o. Male  MRN: [id number]  Subjective:    Chief Complaint  Patient presents with   Annual Exam    Jorge Walker is a 58 y.o. male who presents today for a complete physical exam. He reports consuming a general diet. Home exercise routine includes walking 3 hrs per day. 6 times a week He generally feels well. He reports sleeping fairly well. He does not have additional problems to discuss today.    Most recent fall risk assessment:    07/01/2024    9:36 AM  Fall Risk   Falls in the past year? 0  Number falls in past yr: 0  Injury with Fall? 0  Risk for fall due to : No Fall Risks  Follow up Falls prevention discussed     Most recent depression screenings:    07/01/2024    9:36 AM 03/02/2024    2:57 PM  PHQ 2/9 Scores  PHQ - 2 Score 0 0    Vision:Not within last year   Patient Care Team: Celestia Jorge SQUIBB, NP as PCP - General (Internal Medicine)   Outpatient Medications Prior to Visit  Medication Sig   atorvastatin  (LIPITOR) 40 MG tablet TAKE 1 TABLET BY MOUTH EVERY DAY   azelastine (ASTELIN) 0.1 % nasal spray Place 2 sprays into both nostrils 2 (two) times daily. Use in each nostril as directed   cetirizine (ZYRTEC ALLERGY) 10 MG tablet Take 1 tablet (10 mg total) by mouth daily.   olmesartan -hydrochlorothiazide (BENICAR  HCT) 20-12.5 MG tablet TAKE 1 TABLET BY MOUTH EVERY DAY   olopatadine  (PATANOL) 0.1 % ophthalmic solution Place 1 drop into both eyes 2 (two) times daily.   tamsulosin  (FLOMAX ) 0.4 MG CAPS capsule Take 1 capsule (0.4 mg total) by mouth daily after supper.   [DISCONTINUED] amoxicillin -clavulanate (AUGMENTIN ) 875-125 MG tablet Take 1 tablet by mouth every 12 (twelve) hours. (Patient not taking: Reported on 06/17/2024)   No facility-administered medications prior to visit.    Review of Systems  Eyes:  Positive for blurred vision.  All other systems reviewed and are  negative.         Objective:     BP 104/68   Pulse 66   Resp 16   Ht 5' 10 (1.778 m)   Wt 181 lb 3.2 oz (82.2 kg)   SpO2 97%   BMI 26.00 kg/m  BP Readings from Last 3 Encounters:  07/01/24 104/68  06/17/24 97/62  06/06/24 108/73      Physical Exam Vitals reviewed.  Constitutional:      Appearance: He is normal weight.  HENT:     Head: Normocephalic.     Right Ear: Tympanic membrane, ear canal and external ear normal.     Left Ear: Tympanic membrane, ear canal and external ear normal.     Nose: Nose normal.  Eyes:     Extraocular Movements: Extraocular movements intact.     Pupils: Pupils are equal, round, and reactive to light.  Cardiovascular:     Rate and Rhythm: Normal rate and regular rhythm.  Pulmonary:     Effort: Pulmonary effort is normal.     Breath sounds: Normal breath sounds.  Abdominal:     General: Bowel sounds are normal. There is distension.     Palpations: Abdomen is soft.  Musculoskeletal:        General: Normal range of motion.  Cervical back: Normal range of motion.  Skin:    General: Skin is warm and dry.  Neurological:     Mental Status: He is alert and oriented to person, place, and time.  Psychiatric:        Mood and Affect: Mood normal.        Behavior: Behavior normal.        Thought Content: Thought content normal.        Judgment: Judgment normal.      No results found for any visits on 07/01/24.     Assessment & Plan:    Routine Health Maintenance and Physical Exam  Immunization History  Administered Date(s) Administered   Influenza Split 06/07/2021   Influenza,inj,Quad PF,6+ Mos 08/17/2014   Influenza-Unspecified 05/24/2023, 06/06/2024   PNEUMOCOCCAL CONJUGATE-20 07/01/2024   Pneumococcal Polysaccharide-23 08/17/2014   Tdap 08/17/2014   Unspecified SARS-COV-2 Vaccination 05/24/2023, 06/06/2024   Zoster Recombinant(Shingrix ) 06/07/2021, 05/20/2023    Health Maintenance  Topic Date Due   Hepatitis B  Vaccines 19-59 Average Risk (1 of 3 - 19+ 3-dose series) Never done   COVID-19 Vaccine (3 - Mixed Product risk series) 07/04/2024   DTaP/Tdap/Td (2 - Td or Tdap) 08/17/2024   Colonoscopy  01/23/2026   Pneumococcal Vaccine: 50+ Years  Completed   Influenza Vaccine  Completed   Hepatitis C Screening  Completed   HIV Screening  Completed   Zoster Vaccines- Shingrix   Completed   HPV VACCINES  Aged Out   Meningococcal B Vaccine  Aged Out    Discussed health benefits of physical activity, and encouraged him to engage in regular exercise appropriate for his age and condition.  Problem List Items Addressed This Visit     Smoker   Other Visit Diagnoses       Physical exam    -  Primary     Encounter for immunization       Relevant Orders   Pneumococcal conjugate vaccine 20-valent (Completed)     Essential hypertension         Encounter for HCV screening test for low risk patient         [name] was seen today for annual exam.  Diagnoses and all orders for this visit:  Physical exam -     Hemoglobin A1c  Encounter for immunization -     Pneumococcal conjugate vaccine 20-valent  Essential hypertension  Smoker -     Ambulatory Referral Lung Cancer Screening [facility]  Encounter for HCV screening test for low risk patient  Vision changes -     Ambulatory referral to Ophthalmology  Hepatitis B vaccination status unknown -     Hepatitis B surface antibody,qualitative  Mixed hyperlipidemia -     Lipid panel       Jorge SHAUNNA Bohr, NP

## 2024-07-01 NOTE — Patient Instructions (Signed)
Pneumococcal Conjugate Vaccine: What You Need to Know Many vaccine information statements are available in Spanish and other languages. See PromoAge.com.br. 1. Why get vaccinated? Pneumococcal conjugate vaccine can prevent pneumococcal disease. Pneumococcal disease refers to any illness caused by pneumococcal bacteria. These bacteria can cause many types of illnesses, including pneumonia, which is an infection of the lungs. Pneumococcal bacteria are one of the most common causes of pneumonia. Besides pneumonia, pneumococcal bacteria can also cause: Ear infections Sinus infections Meningitis (infection of the tissue covering the brain and spinal cord) Bacteremia (infection of the blood) Anyone can get pneumococcal disease, but children under 13 years old, people with certain medical conditions or other risk factors, and adults 65 years or older are at the highest risk. Most pneumococcal infections are mild. However, some can result in long-term problems, such as brain damage or hearing loss. Meningitis, bacteremia, and pneumonia caused by pneumococcal disease can be fatal. 2. Pneumococcal conjugate vaccine Pneumococcal conjugate vaccine helps protect against bacteria that cause pneumococcal disease. There are three pneumococcal conjugate vaccines (PCV13, PCV15, and PCV20). The different vaccines are recommended for different people based on age and medical status. Your health care provider can help you determine which type of pneumococcal conjugate vaccine, and how many doses, you should receive. Infants and young children usually need 4 doses of pneumococcal conjugate vaccine. These doses are recommended at 2, 4, 6, and 58-29 months of age. Older children and adolescents might need pneumococcal conjugate vaccine depending on their age and medical conditions or other risk factors if they did not receive the recommended doses as infants or young children. Adults 19 through 11 years old with certain  medical conditions or other risk factors who have not already received pneumococcal conjugate vaccine should receive pneumococcal conjugate vaccine. Adults 65 years or older who have not previously received pneumococcal conjugate vaccine should receive pneumococcal conjugate vaccine. Some people with certain medical conditions are also recommended to receive pneumococcal polysaccharide vaccine (a different type of pneumococcal vaccine known as PPSV23). Some adults who have previously received a pneumococcal conjugate vaccine may be recommended to receive another pneumococcal conjugate vaccine. 3. Talk with your health care provider Tell your vaccination provider if the person getting the vaccine: Has had an allergic reaction after a previous dose of any type of pneumococcal conjugate vaccine (PCV13, PCV15, PCV20, or an earlier pneumococcal conjugate vaccine known as PCV7), or to any vaccine containing diphtheria toxoid (for example, DTaP), or has any severe, life-threatening allergies In some cases, your health care provider may decide to postpone pneumococcal conjugate vaccination until a future visit. People with minor illnesses, such as a cold, may be vaccinated. People who are moderately or severely ill should usually wait until they recover. Your health care provider can give you more information. 4. Risks of a vaccine reaction Redness, swelling, pain, or tenderness where the shot is given, and fever, loss of appetite, fussiness (irritability), feeling tired, headache, muscle aches, joint pain, and chills can happen after pneumococcal conjugate vaccination. Young children may be at increased risk for seizures caused by fever after a pneumococcal conjugate vaccine if it is administered at the same time as inactivated influenza vaccine. Ask your health care provider for more information. People sometimes faint after medical procedures, including vaccination. Tell your provider if you feel dizzy or  have vision changes or ringing in the ears. As with any medicine, there is a very remote chance of a vaccine causing a severe allergic reaction, other serious injury, or death. 5.  What if there is a serious problem? An allergic reaction could occur after the vaccinated person leaves the clinic. If you see signs of a severe allergic reaction (hives, swelling of the face and throat, difficulty breathing, a fast heartbeat, dizziness, or weakness), call 9-1-1 and get the person to the nearest hospital. For other signs that concern you, call your health care provider. Adverse reactions should be reported to the Vaccine Adverse Event Reporting System (VAERS). Your health care provider will usually file this report, or you can do it yourself. Visit the VAERS website at www.vaers.LAgents.no or call 913-439-3438. VAERS is only for reporting reactions, and VAERS staff members do not give medical advice. 6. The National Vaccine Injury Compensation Program The [employer] Vaccine Injury Compensation Program (VICP) is a federal program that was created to compensate people who may have been injured by certain vaccines. Claims regarding alleged injury or death due to vaccination have a time limit for filing, which may be as short as two years. Visit the VICP website at SpiritualWord.at or call 860 253 0829 to learn about the program and about filing a claim. 7. How can I learn more? Ask your health care provider. Call your local or state health department. Visit the website of the Food and Drug Administration (FDA) for vaccine package inserts and additional information at FinderList.no. Contact the Centers for Disease Control and Prevention (CDC): Call 4055360835 (1-800-CDC-INFO) or Visit CDC's website at PicCapture.uy. Source: CDC Vaccine Information Statement (Interim) Pneumococcal Conjugate Vaccine (01/04/2022) This same material is available at  FootballExhibition.com.br for no charge. This information is not intended to replace advice given to you by your health care provider. Make sure you discuss any questions you have with your health care provider. Document Revised: 11/27/2022 Document Reviewed: 09/02/2022 Elsevier Patient Education  2024 [employer].

## 2024-07-02 ENCOUNTER — Ambulatory Visit (INDEPENDENT_AMBULATORY_CARE_PROVIDER_SITE_OTHER): Payer: Self-pay | Admitting: Primary Care

## 2024-07-02 LAB — HEPATITIS B SURFACE ANTIBODY,QUALITATIVE: Hep B Surface Ab, Qual: REACTIVE

## 2024-07-02 LAB — LIPID PANEL
Chol/HDL Ratio: 3.1 ratio (ref 0.0–5.0)
Cholesterol, Total: 125 mg/dL (ref 100–199)
HDL: 40 mg/dL (ref >39)
LDL Chol Calc (NIH): 68 mg/dL (ref 0–99)
Triglycerides: 85 mg/dL (ref 0–149)
VLDL Cholesterol Cal: 17 mg/dL (ref 5–40)

## 2024-07-02 LAB — HEMOGLOBIN A1C
Est. average glucose Bld gHb Est-mCnc: 126 mg/dL
Hgb A1c MFr Bld: 6 % — ABNORMAL HIGH (ref 4.8–5.6)

## 2024-07-02 MED ORDER — ATORVASTATIN CALCIUM 20 MG PO TABS: 20.0000 mg | ORAL_TABLET | Freq: Every day | ORAL | 1 refills | Status: AC

## 2024-07-09 ENCOUNTER — Telehealth: Payer: Self-pay | Admitting: Acute Care

## 2024-07-09 ENCOUNTER — Telehealth: Payer: Self-pay

## 2024-07-09 DIAGNOSIS — F17210 Nicotine dependence, cigarettes, uncomplicated: Secondary | ICD-10-CM

## 2024-07-09 DIAGNOSIS — Z122 Encounter for screening for malignant neoplasm of respiratory organs: Secondary | ICD-10-CM

## 2024-07-09 DIAGNOSIS — Z87891 Personal history of nicotine dependence: Secondary | ICD-10-CM

## 2024-07-09 NOTE — Telephone Encounter (Signed)
 Lung Cancer Screening Narrative/Criteria Questionnaire (Cigarette Smokers Only- No Cigars/Pipes/vapes)   ELAZAR ARGABRIGHT   SDMV:07/26/2024 11:00a Jorge Walker       April 21, 1966   LDCT: 07/27/2024 1:40p GI    57 y.o.   Phone: [phone]  Lung Screening Narrative (confirm age 85-77 yrs Medicare / 50-80 yrs Private pay insurance)   Insurance information:BCBS   Referring Provider:Michelle Edwards NP    This screening involves an initial phone call with a team member from our program. It is called a shared decision making visit. The initial meeting is required by  insurance and Medicare to make sure you understand the program. This appointment takes about 15-20 minutes to complete. You will complete the screening scan at your scheduled date/time.  This scan takes about 5-10 minutes to complete. You can eat and drink normally before and after the scan.  Criteria questions for Lung Cancer Screening:   Are you a current or former smoker? Current Age began smoking: 58yo   If you are a former smoker, what year did you quit smoking? N/A(within 15 yrs)   To calculate your smoking history, I need an accurate estimate of how many packs of cigarettes you smoked per day and for how many years. (Not just the number of PPD you are now smoking)   Years smoking 28 x Packs per day 1 = Pack years 28   (at least 20 pack yrs)   (Make sure they understand that we need to know how much they have smoked in the past, not just the number of PPD they are smoking now)  Do you have a personal history of cancer?  Yes - (type and when diagnosed - 5 yrs cancer free) Prostate - dx in 2024. Patient being referred for RALP.    Do you have a family history of cancer? No  Are you coughing up blood?  No  Have you had unexplained weight loss of 15 lbs or more in the last 6 months? No  It looks like you meet all criteria.  When would be a good time for us  to schedule you for this screening?   Additional information:  N/A

## 2024-07-09 NOTE — Telephone Encounter (Signed)
 Pt called in stating no one from [facility] has called him regarding his referral.  Pt wanted to let Dr Roseann know that he is unsure if he wants to do the prostatectomy and he may want to try radiation therapy instead. He is unsure how to proceed. Advised pt I would put a note in to Dr Roseann.

## 2024-07-12 ENCOUNTER — Encounter: Payer: Self-pay | Admitting: Urology

## 2024-07-13 ENCOUNTER — Encounter: Payer: Self-pay | Admitting: Urology

## 2024-07-16 ENCOUNTER — Other Ambulatory Visit: Payer: Self-pay

## 2024-07-16 DIAGNOSIS — N401 Enlarged prostate with lower urinary tract symptoms: Secondary | ICD-10-CM

## 2024-07-16 DIAGNOSIS — N400 Enlarged prostate without lower urinary tract symptoms: Secondary | ICD-10-CM

## 2024-07-19 NOTE — Progress Notes (Signed)
 I called pt to introduce myself as  the Coordinator of the Prostate MDC.   1. I confirmed with the patient he is aware of his referral to the clinic 12/12, arriving @ 8:00 a.m..    2. I discussed the format of the clinic and the physicians he will be seeing that day.   3. I discussed where the clinic is located and how to contact me.   4. I confirmed his address and informed him I would be mailing a packet of information and forms to be completed. I asked him to bring them with him the day of his appointment.    He voiced understanding of the above. I asked him to call me if he has any questions or concerns regarding his appointments or the forms he needs to complete.

## 2024-07-26 ENCOUNTER — Encounter: Payer: Self-pay | Admitting: *Deleted

## 2024-07-26 ENCOUNTER — Ambulatory Visit: Admitting: *Deleted

## 2024-07-26 DIAGNOSIS — F17210 Nicotine dependence, cigarettes, uncomplicated: Secondary | ICD-10-CM

## 2024-07-26 NOTE — Progress Notes (Cosign Needed)
  Virtual Visit via Telephone Note  I connected with Abran LITTIE Fabian on 07/26/24 at 11:00 AM EST by telephone and verified that I am speaking with the correct person using two identifiers.  Location: Patient: at home Provider: [address] [place], [place], [address]    I discussed the limitations, risks, security and privacy concerns of performing an evaluation and management service by telephone and the availability of in person appointments. I also discussed with the patient that there may be a patient responsible charge related to this service. The patient expressed understanding and agreed to proceed.    Shared Decision Making Visit Lung Cancer Screening Program [phone])   Eligibility: Age 9 y.o. Pack Years Smoking History Calculation 28 (# packs/per year x # years smoked) Recent History of coughing up blood  no Unexplained weight loss? no ( >Than 15 pounds within the last 6 months ) Prior History Lung / other cancer no (Diagnosis within the last 5 years already requiring surveillance chest CT Scans). Smoking Status Current Smoker Former Smokers: Years since quit: n/a  Quit Date: n/a  Visit Components: Discussion included one or more decision making aids. yes Discussion included risk/benefits of screening. yes Discussion included potential follow up diagnostic testing for abnormal scans. yes Discussion included meaning and risk of over diagnosis. yes Discussion included meaning and risk of False Positives. yes Discussion included meaning of total radiation exposure. yes  Counseling Included: Importance of adherence to annual lung cancer LDCT screening. yes Impact of comorbidities on ability to participate in the program. yes Ability and willingness to under diagnostic treatment. yes  Smoking Cessation Counseling: Current Smokers:  Discussed importance of smoking cessation. yes Information about tobacco cessation classes and interventions provided to  patient. yes Patient provided with ticket for LDCT Scan. no Symptomatic Patient. no   Diagnosis Code: Tobacco Use Z72.0 Asymptomatic Patient yes  Smoking/Tobacco Cessation Counseling TOSHIYUKI FREDELL is a current user of tobacco or nicotine products. He is not ready to quit at this time. Counseling provided today addressed the risks of continued use and the benefits of cessation. Discussed tobacco/nicotine use history, readiness to quit, and evidence-based treatment options including behavioral strategies, support resources, and pharmacologic therapies. Provided encouragement and educational materials on steps and resources to quit smoking. Patient questions were addressed, and follow-up recommended for continued support. Total time spent on counseling: 4 minutes.   Former Smokers:  Discussed the importance of maintaining cigarette abstinence. yes Diagnosis Code: Personal History of Nicotine Dependence. S12.108 Information about tobacco cessation classes and interventions provided to patient. Yes Patient provided with ticket for LDCT Scan. no Written Order for Lung Cancer Screening with LDCT placed in Epic. Yes (CT Chest Lung Cancer Screening Low Dose W/O CM) [id number] Z12.2-Screening of respiratory organs Z87.891-Personal history of nicotine dependence   Laneta Speaks, RN

## 2024-07-26 NOTE — Patient Instructions (Signed)

## 2024-07-27 ENCOUNTER — Inpatient Hospital Stay: Admission: RE | Admit: 2024-07-27 | Source: Ambulatory Visit

## 2024-07-27 ENCOUNTER — Inpatient Hospital Stay: Admission: RE | Admit: 2024-07-27 | Discharge: 2024-07-27 | Attending: Acute Care | Admitting: Acute Care

## 2024-07-27 DIAGNOSIS — Z122 Encounter for screening for malignant neoplasm of respiratory organs: Secondary | ICD-10-CM

## 2024-07-27 DIAGNOSIS — F17210 Nicotine dependence, cigarettes, uncomplicated: Secondary | ICD-10-CM

## 2024-07-27 DIAGNOSIS — Z87891 Personal history of nicotine dependence: Secondary | ICD-10-CM

## 2024-07-30 ENCOUNTER — Telehealth: Payer: Self-pay | Admitting: Acute Care

## 2024-07-30 DIAGNOSIS — R932 Abnormal findings on diagnostic imaging of liver and biliary tract: Secondary | ICD-10-CM

## 2024-07-30 NOTE — Telephone Encounter (Signed)
 I have called ands spoken with the patient . We have reviewed the results of his CT Chest. From a Lung Cancer perspective his scan was read as a LR 1, negative for lung cancer. There were , however notations of hypotensives of the liver . Pt. Has prostate cancer . Radiology recommended a hepatic protocol MR Liver. I have ordered this, and the patient will follow up with me by video visit to review results 1-2 weeks after the scan.  Please fax results to PCP, and let them know plan of care. Thanks so much

## 2024-08-02 ENCOUNTER — Other Ambulatory Visit: Payer: Self-pay

## 2024-08-02 ENCOUNTER — Telehealth: Payer: Self-pay | Admitting: *Deleted

## 2024-08-02 DIAGNOSIS — F17210 Nicotine dependence, cigarettes, uncomplicated: Secondary | ICD-10-CM

## 2024-08-02 DIAGNOSIS — Z122 Encounter for screening for malignant neoplasm of respiratory organs: Secondary | ICD-10-CM

## 2024-08-02 DIAGNOSIS — Z87891 Personal history of nicotine dependence: Secondary | ICD-10-CM

## 2024-08-02 NOTE — Telephone Encounter (Signed)
 Reminder set to contact patient and schedule video visit to review results once completed. Annual LCS ordered. Results and plan to PCP.

## 2024-08-02 NOTE — Telephone Encounter (Signed)
 Called patient and made aware. Belvie Isaiah HERO, RN    08/02/24  8:01 AM Note Reminder set to contact patient and schedule video visit to review results once completed. Annual LCS ordered. Results and plan to PCP.       Copied from CRM #[id number]. Topic: Clinical - Lab/Test Results >> Jul 30, 2024  4:27 PM Devaughn RAMAN wrote: Reason for CRM: Pt calling NP Lauraine Lites back regarding his results, pt stated he has additional questions regarding his results.

## 2024-08-04 ENCOUNTER — Telehealth: Payer: Self-pay | Admitting: Acute Care

## 2024-08-04 ENCOUNTER — Inpatient Hospital Stay: Admission: RE | Admit: 2024-08-04 | Discharge: 2024-08-04 | Attending: Acute Care | Admitting: Acute Care

## 2024-08-04 DIAGNOSIS — R932 Abnormal findings on diagnostic imaging of liver and biliary tract: Secondary | ICD-10-CM

## 2024-08-04 MED ORDER — GADOPICLENOL 0.5 MMOL/ML IV SOLN
8.0000 mL | Freq: Once | INTRAVENOUS | Status: AC | PRN
  Administered 2024-08-04: 09:00:00 8 mL via INTRAVENOUS

## 2024-08-04 NOTE — Telephone Encounter (Signed)
 I have attempted to call the patient with the results of his MR Liver. There was no answer.  The results show no suspicious liver lesion . This is good news. I have left our contact number so the patient can call back for results.  If we have not heard from him, please attempt to call him again and let him know his scan was fine.  From a lung cancer perspective he just needs his follow-up low-dose CT 1 year from his previous scan.  Thank you so much.  Please notify PCP that MR liver was clear and the patient needs a 1 year follow-up annual lung cancer screening scan.  Patient has coronary artery calcifications and aortic atherosclerosis.  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 No suspicious liver lesion. This imaging was corresponds to the hypoattenuating lesions described on the recent chest CT scan. 2. 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 No hydroureteronephrosis or suspicious mass. 3. Otherwise essentially unremarkable exam.

## 2024-08-05 NOTE — Progress Notes (Unsigned)
° °                              Care Plan Summary  Name: Jorge Walker DOB: 04-19-1966   Your Medical Team:   Urologist -  Dr. Gretel Ferrara, [facility]  Radiation Oncologist - Dr. Donnice Barge, [facility]   Medical Oncologist - Dr. Pauletta Chihuahua, [facility]  Recommendations: Start on a 5-alpha reductase inhibitor (Finasteride ) 5.5 weeks of daily radiation   * These recommendations are based on information available as of todays consult.      Recommendations may change depending on the results of further tests or exams.     When appointments need to be scheduled, you will be contacted by [facility] and/or [facility].  Questions?  Please do not hesitate to call Vertell Pont, BSN, RN at [phone] with any questions or concerns.  Vertell is your Oncology Nurse Navigator and is available to assist you while youre receiving your medical care at [facility].

## 2024-08-05 NOTE — Progress Notes (Signed)
 RN spoke with patient to review upcoming PMDC on 12/12.

## 2024-08-05 NOTE — Telephone Encounter (Signed)
 Called and spoke to pt. Informed him of the results of the liver MRI. Patient verbalized understanding and denies any concerns or questions at this time. Annual LDCT order has already been placed for next year. Results and plan sent to PCP.

## 2024-08-05 NOTE — Progress Notes (Signed)
 Radiation Oncology         [phone]) [phone] ________________________________  Multidisciplinary Prostate Cancer Clinic  Initial Radiation Oncology Consultation  Name: Jorge Walker MRN: [id number]  Date: 08/06/2024  DOB: 03-19-1966  RR:Zitjmid, Rosaline SQUIBB, NP  Stoneking, Adine PARAS., MD   REFERRING PHYSICIAN: Roseann Adine PARAS., MD  DIAGNOSIS: 58 y.o. gentleman with stage T1c adenocarcinoma of the prostate with a Gleason's score of 3+3 and a PSA of 6.8    ICD-10-CM   1. Malignant neoplasm of prostate (HCC)  C61       HISTORY OF PRESENT ILLNESS::Jorge Walker is a 58 y.o. gentleman. He has been followed by urology for several years for LUTS and an elevated PSA. He had previously been recommended to proceed with prostate biopsy, but was lost to follow up. His PSA had fluctuated but ultimately increased to 5.8 so he was referred back for evaluation with Dr. Shona in 02/2023. He was subsequently diagnosed with low volume Gleason 3+3 prostate cancer involving 1 of 12 cores on biopsy performed on 03/14/2023. He was appropriately placed on active surveillance at that time. He underwent a prostate MRI on 05/09/23 that showed two PI-RADS 3 lesions in the peripheral zone bilaterally.  His PSA increased to 7 in 10/2023 and remained elevated at 6.8 when repeated on 03/17/2024.  Therefore, he underwent a surveillance MRI fusion prostate biopsy on 04/13/24 under the care of of Dr. Alvaro, in anticipation of considering definitive management.  Unfortunately, he went into acute urinary retention following the procedure.  The prostate volume measured 128 cc.  Out of 16 core biopsies, only 1 was positive.  The maximum Gleason score was 3+3, and this was seen in the right apex (small focus) while samples from both MRI ROI were benign.  The patient reviewed the biopsy results with his urologist and he has kindly been referred today to the multidisciplinary prostate cancer clinic for presentation of pathology and  radiology studies in our conference for discussion of potential radiation treatment options and clinical evaluation.  PREVIOUS RADIATION THERAPY: No  PAST MEDICAL HISTORY:  has a past medical history of Allergy, Arthritis, BPH (benign prostatic hypertrophy) (2020), Elevated PSA, Epididymal cyst, Hypertension, Prostate cancer (HCC), and Tobacco use disorder.    PAST SURGICAL HISTORY: Past Surgical History:  Procedure Laterality Date   ARM WOUND REPAIR / CLOSURE     right upper posterior arm - teenager, s/p trauma   ARTERY REPAIR     right upper arm - teenager, s/p trauma to right upper arm   COLONOSCOPY  01/24/2016   Diverticulosis in left and right colon.   Repeat 10 years.  Dr. Norleen Abran   ELBOW SURGERY  2019   PROSTATE BIOPSY      FAMILY HISTORY: family history includes Diabetes in his mother; Hypertension in his mother; Pneumonia in his father.  SOCIAL HISTORY:  reports that he has been smoking cigarettes. He started smoking about 28 years ago. He has a 29 pack-year smoking history. He has never used smokeless tobacco. He reports current alcohol use of about 5.0 standard drinks of alcohol per week. He reports that he does not use drugs.  ALLERGIES: Amlodipine   MEDICATIONS:  Current Outpatient Medications  Medication Sig Dispense Refill   atorvastatin  (LIPITOR) 20 MG tablet Take 1 tablet (20 mg total) by mouth daily. (Patient taking differently: Take 40 mg by mouth daily.) 90 tablet 1   olmesartan -hydrochlorothiazide (BENICAR  HCT) 20-12.5 MG tablet TAKE 1 TABLET BY MOUTH EVERY DAY 90 tablet 0  tamsulosin  (FLOMAX ) 0.4 MG CAPS capsule Take 1 capsule (0.4 mg total) by mouth daily after supper. 90 capsule 3   No current facility-administered medications for this encounter.    REVIEW OF SYSTEMS:  On review of systems, the patient reports that he is doing well overall. He denies any chest pain, shortness of breath, cough, fevers, chills, night sweats, unintended weight changes. He  denies any bowel disturbances, and denies abdominal pain, nausea or vomiting. He denies any new musculoskeletal or joint aches or pains. His IPSS was 11, indicating moderate urinary symptoms. His SHIM was 17, indicating he has mild erectile dysfunction. A complete review of systems is obtained and is otherwise negative.   PHYSICAL EXAM:  Wt Readings from Last 3 Encounters:  08/06/24 183 lb 12.8 oz (83.4 kg)  07/01/24 181 lb 3.2 oz (82.2 kg)  06/17/24 172 lb (78 kg)   Temp Readings from Last 3 Encounters:  08/06/24 97.7 F (36.5 C)  06/06/24 98.2 F (36.8 C)  04/30/24 98.7 F (37.1 C) (Oral)   BP Readings from Last 3 Encounters:  08/06/24 (!) 143/85  07/01/24 104/68  06/17/24 97/62   Pulse Readings from Last 3 Encounters:  08/06/24 65  07/01/24 66  06/17/24 (!) 59    /10  In general this is a well appearing African-American man in no acute distress. He's alert and oriented x4 and appropriate throughout the examination. Cardiopulmonary assessment is negative for acute distress and he exhibits normal effort.    KPS = 100  100 - Normal; no complaints; no evidence of disease. 90   - Able to carry on normal activity; minor signs or symptoms of disease. 80   - Normal activity with effort; some signs or symptoms of disease. 71   - Cares for self; unable to carry on normal activity or to do active work. 60   - Requires occasional assistance, but is able to care for most of his personal needs. 50   - Requires considerable assistance and frequent medical care. 40   - Disabled; requires special care and assistance. 30   - Severely disabled; hospital admission is indicated although death not imminent. 20   - Very sick; hospital admission necessary; active supportive treatment necessary. 10   - Moribund; fatal processes progressing rapidly. 0     - Dead  Karnofsky DA, Abelmann WH, Craver LS and Burchenal JH [phone]) The use of the nitrogen mustards in the palliative treatment of  carcinoma: with particular reference to bronchogenic carcinoma Cancer 1 [id number]   LABORATORY DATA:  Lab Results  Component Value Date   WBC 17.5 (H) 04/30/2024   HGB 11.3 (L) 04/30/2024   HCT 36.1 (L) 04/30/2024   MCV 85.5 04/30/2024   PLT 232 04/30/2024   Lab Results  Component Value Date   NA 135 04/30/2024   K 3.2 (L) 04/30/2024   CL 101 04/30/2024   CO2 23 04/30/2024   Lab Results  Component Value Date   ALT 21 03/02/2024   AST 24 03/02/2024   ALKPHOS 65 03/02/2024   BILITOT 0.3 03/02/2024     RADIOGRAPHY: MR LIVER W WO CONTRAST Result Date: 08/04/2024 CLINICAL DATA:  liver hypodensities on LDCT, not simple cysts. Prostate cancer EXAM: MRI ABDOMEN WITHOUT AND WITH CONTRAST TECHNIQUE: Multiplanar multisequence MR imaging of the abdomen was performed both before and after the administration of intravenous contrast. CONTRAST:  8 mL of Vueway . COMPARISON:  CT scan chest from 07/27/2024. FINDINGS: Lower chest: Unremarkable MR appearance to the  lung bases. No pleural effusion. No pericardial effusion. Normal heart size. Hepatobiliary: The liver is normal in size. Noncirrhotic configuration. There are 3 mildly T2 hyperintense well-circumscribed lesions in the liver with postcontrast imaging characteristics compatible with hemangiomas. The lesions are as follows: In the right hepatic lobe, segment 7 measuring 2.3 x 2.6 cm. Left hepatic lobe, segment 4 B measuring 1.9 x 3.3 cm and in the right hepatic lobe, segment 6 measuring 1.5 x 2.4 cm. No other focal or suspicious liver lesions seen. These lesions corresponds to the hypodense lesions seen on the prior chest CT scan. No intrahepatic or extrahepatic bile duct dilatation. No choledocholithiasis. Unremarkable gallbladder. Pancreas: No mass, inflammatory changes or other parenchymal abnormality identified. No main pancreatic duct dilation. Spleen:  Within normal limits in size and appearance. No focal mass. Adrenals/Urinary Tract: Unremarkable  adrenal glands. 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 Malascended and malrotated right kidney noted. No hydroureteronephrosis or suspicious mass. Stomach/Bowel: Visualized portions within the abdomen are unremarkable. No disproportionate dilation of bowel loops. Vascular/Lymphatic: No pathologically enlarged lymph nodes identified. No abdominal aortic aneurysm demonstrated. No ascites. Other:  None. Musculoskeletal: No suspicious bone lesions identified. IMPRESSION: 1. 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 No suspicious liver lesion. This imaging was corresponds to the hypoattenuating lesions described on the recent chest CT scan. 2. 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 No hydroureteronephrosis or suspicious mass. 3. Otherwise essentially unremarkable exam. Electronically Signed   By: Ree Molt M.D.   On: 08/04/2024 12:11   CT CHEST LUNG CA SCREEN LOW DOSE W/O CM Result Date: 07/30/2024 CLINICAL DATA:  Lung cancer screening. Current smoker with 28 pack-year history. EXAM: CT CHEST WITHOUT CONTRAST LOW-DOSE FOR LUNG CANCER SCREENING TECHNIQUE: Multidetector CT imaging of the chest was performed following the standard protocol without IV contrast. RADIATION DOSE REDUCTION: This exam was performed according to the departmental dose-optimization program which includes automated exposure control, adjustment of the mA and/or kV according to patient size and/or use of iterative reconstruction technique. COMPARISON:  None Available. FINDINGS: Cardiovascular: The heart is normal in size. There are coronary artery calcifications. Aortic atherosclerosis. No aortic aneurysm. No visible thyroid nodule. Mediastinum/Nodes: No enlarged mediastinal lymph nodes. Limited hilar assessment in the absence of IV contrast. Patulous esophagus containing small amount of intraluminal fluid. Lungs/Pleura: Mild emphysema with central  bronchial thickening. No consolidative airspace disease. No pleural effusion. Biapical pleuroparenchymal scarring. No suspicious pulmonary nodules. Upper Abdomen: 3.3 cm hypodense lesion in the posterior right lobe of the liver, series 2, image 53, Hounsfield units of 42. Additional air a geographic low-density in the subcapsular left lobe, series 2, image 54, may represent an area of focal fatty infiltration versus lesion, poorly evaluated on this unenhanced exam. 2.9 cm density lesion in the inferior right hepatic lobe with Hounsfield units of 45. Musculoskeletal: There are no acute or suspicious osseous abnormalities. Mild broad-based scoliosis and degenerative change in the spine. IMPRESSION: 1. Lung-RADS 1S, negative. Continue annual screening with low-dose chest CT without contrast in 12 months. 2. The S modifier refers to non lung cancer screening findings. There are multiple hypodense liver lesions that are incompletely characterized on the current exam, but do not characterize as simple cysts. Recommend hepatic protocol MRI for further assessment. 3. Coronary artery calcifications. Aortic Atherosclerosis (ICD10-I70.0) and Emphysema (ICD10-J43.9). Electronically Signed   By: Andrea Gasman M.D.   On: 07/30/2024 12:14      IMPRESSION/PLAN: 58  y.o. gentleman with Stage T1c adenocarcinoma of the prostate with a Gleason score of 3+3 and a PSA of 6.8.    We discussed the patient's workup and outlined the nature of prostate cancer in this setting. The patient's T stage, Gleason's score, and PSA put him into the favorable low risk group. Accordingly, he is eligible for a variety of potential treatment options including continued active surveillance, brachytherapy, 5.5 weeks of external radiation, or prostatectomy.  He is adamantly not interested in continuing active surveillance and instead, prefers to proceed with definitive treatment.  We discussed the available radiation techniques, and focused on the  details and logistics of delivery. The patient is not an ideal candidate for brachytherapy with a prostate volume of 128 cc.  Therefore, we discussed and outlined the risks, benefits, short and long-term effects associated with daily external beam radiotherapy and compared and contrasted these with prostatectomy. We discussed the role of SpaceOAR gel in reducing the rectal toxicity associated with radiotherapy. He was encouraged to ask questions that were answered to his/their stated satisfaction.  At the end of the conversation the patient is interested in moving forward with 5.5 weeks of external beam therapy. We will share our discussion with Dr. Roseann and make arrangements for a follow-up visit for further discussion regarding potential TURP at the time of fiducial markers and SpaceOAR gel placement, prior to simulation, to reduce rectal toxicity from radiotherapy. The patient appears to have a good understanding of his disease and our treatment recommendations which are of curative intent and is in agreement with the stated plan.  Therefore, we will move forward with treatment planning accordingly, in anticipation of beginning IMRT approximately 6 weeks after his procedure for TURP with fiducial markers and SpaceOAR gel placement.  We enjoyed meeting him today and look forward to continuing to participate in his care.  We personally spent 60 minutes in this encounter including chart review, reviewing radiological studies, meeting face-to-face with the patient, entering orders and completing documentation.    Sabra MICAEL Rusk, PA-C    Donnice Barge, MD  [facility]  Radiation Oncology Direct Dial: [phone]  Fax: [phone] [email or web address]  Skype  [email or web address]   This document serves as a record of services personally performed by Donnice Barge, MD and Sabra Rusk, PA-C. It was created on their behalf by Izetta Neither, a trained medical scribe. The creation of this record is  based on the scribe's personal observations and the provider's statements to them. This document has been checked and approved by the attending provider.

## 2024-08-05 NOTE — Progress Notes (Unsigned)
 [facility] CONSULT NOTE  Patient Care Team: Celestia Rosaline SQUIBB, NP as PCP - General (Internal Medicine) Vertell Pont, RN as Oncology Nurse Navigator  ASSESSMENT & PLAN:  Jorge Walker is a 58 y.o.male with history of hypertension and hyperlipidemia being seen at [facility] for prostate cancer.  Initial diagnosis: stage T1c adenocarcinoma of the prostate. GS of 3+3 GG1. PSA of 7  Treatment: To be determined  His case was discussed at tumor board with multiple specialists including radiation oncologist, urology oncologist, pathologist, radiologist. The patient was counseled on the natural history of prostate cancer and the standard treatment options that are available for prostate cancer.   For low risk risk, per NCCN guideline, in patients with > 10 years of life expectancy, active surveillance, RP or RT are all options resulted in excellent long term survival.  Patient was encouraged to consider the side effect profiles of each treatment modality and make an informed decision for definitive treatment.  Continue ongoing follow-up whether with active surveillance or elected on definitive treatment. Assessment & Plan Malignant neoplasm of prostate [facility]) Patient will follow-up with radiation oncology or urologic oncology for definitive treatment as well as active surveillance.  He may follow-up with medical oncology as needed. Healthy lifestyle to prevent diabetes and CV disease Weight-bearing exercises (30 minutes per day) Limit alcohol consumption and avoid smoking  All questions were answered. The patient knows to call the clinic with any problems, questions or concerns. No barriers to learning was detected.  Pauletta JAYSON Chihuahua, MD 12/12/202512:02 PM  CHIEF COMPLAINTS/PURPOSE OF CONSULTATION:  prostate cancer  HISTORY OF PRESENTING ILLNESS:  Jorge Walker 58 y.o. male is here because of prostate cancer.  I have reviewed his chart and materials related to his cancer  extensively and collaborated history with the patient. Summary of oncologic history is as follows:  Report he has been followed by urology for several years for LUTS and an elevated PSA. He had previously been recommended to proceed with prostate biopsy, but he did not follow up. He saw Dr. Shona in 02/2023 for an elevated PSA of 5.8. He underwent biopsy on 03/12/23 and showed one core + Gleason 3+3 prostate cancer. He continue on active surveillance at that time. MRI on 05/09/23 showed two PI-RADS 3 lesions in peripheral zone. PSA raised to 6.8. He underwent a MRI fusion prostate biopsy on 04/13/24 under Dr. Alvaro. The prostate volume measured 128 cc.  Out of 16 core biopsies, 1 was positive for GS 3+3 lesion, and this was seen in right apex.    MEDICAL HISTORY:  Past Medical History:  Diagnosis Date   Allergy    Arthritis    finger   BPH (benign prostatic hypertrophy) 2020   BPH with elevated PSA   Elevated PSA    Epididymal cyst    Hypertension    Prostate cancer (HCC)    Tobacco use disorder     SURGICAL HISTORY: Past Surgical History:  Procedure Laterality Date   ARM WOUND REPAIR / CLOSURE     right upper posterior arm - teenager, s/p trauma   ARTERY REPAIR     right upper arm - teenager, s/p trauma to right upper arm   COLONOSCOPY  01/24/2016   Diverticulosis in left and right colon.   Repeat 10 years.  Dr. Norleen Abran   ELBOW SURGERY  2019   PROSTATE BIOPSY      SOCIAL HISTORY: Social History   Socioeconomic History   Marital status: Single  Spouse name: Not on file   Number of children: Not on file   Years of education: Not on file   Highest education level: Not on file  Occupational History   Not on file  Tobacco Use   Smoking status: Every Day    Current packs/day: 1.00    Average packs/day: 1 pack/day for 28.9 years (28.9 ttl pk-yrs)    Types: Cigarettes    Start date: 1997   Smokeless tobacco: Never  Vaping Use   Vaping status: Never Used  Substance and  Sexual Activity   Alcohol use: Yes    Alcohol/week: 5.0 standard drinks of alcohol    Types: 5 Shots of liquor per week    Comment: weekends    Drug use: No   Sexual activity: Not on file  Other Topics Concern   Not on file  Social History Narrative   Single, girlfriend, divorced, has 1 child, 22yo daughter wants to go to college, she lives with mother, exercise with walking at work, works in teaching laboratory technician at [employer], 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   Social Drivers of Health   Tobacco Use: High Risk (07/26/2024)   Patient History    Smoking Tobacco Use: Every Day    Smokeless Tobacco Use: Never    Passive Exposure: Not on file  Financial Resource Strain: Not on file  Food Insecurity: No Food Insecurity (08/06/2024)   Epic    Worried About Programme Researcher, Broadcasting/film/video in the Last Year: Never true    Ran Out of Food in the Last Year: Never true  Transportation Needs: No Transportation Needs (08/06/2024)   Epic    Lack of Transportation (Medical): No    Lack of Transportation (Non-Medical): No  Physical Activity: Not on file  Stress: Not on file  Social Connections: Not on file  Intimate Partner Violence: Not At Risk (08/06/2024)   Epic    Fear of Current or Ex-Partner: No    Emotionally Abused: No    Physically Abused: No    Sexually Abused: No  Depression (PHQ2-9): Low Risk (08/06/2024)   Depression (PHQ2-9)    PHQ-2 Score: 0  Alcohol Screen: Not on file  Housing: Unknown (08/06/2024)   Epic    Unable to Pay for Housing in the Last Year: No    Number of Times Moved in the Last Year: Not on file    Homeless in the Last Year: No  Utilities: Not At Risk (12/02/2023)   [employer] Utilities    Threatened with loss of utilities: No  Health Literacy: Not on file    FAMILY HISTORY: Family History  Problem Relation Age of Onset   Hypertension Mother    Diabetes Mother    Pneumonia Father        died of pneumonia   Cancer Neg Hx    Heart disease Neg Hx    Stroke Neg Hx     Colon cancer Neg Hx    Colon polyps Neg Hx    Esophageal cancer Neg Hx    Rectal cancer Neg Hx    Stomach cancer Neg Hx     ALLERGIES:  is allergic to amlodipine .  MEDICATIONS:  Current Outpatient Medications  Medication Sig Dispense Refill   atorvastatin  (LIPITOR) 20 MG tablet Take 1 tablet (20 mg total) by mouth daily. (Patient taking differently: Take 40 mg by mouth daily.) 90 tablet 1   olmesartan -hydrochlorothiazide (BENICAR  HCT) 20-12.5 MG tablet TAKE 1 TABLET BY MOUTH EVERY DAY  90 tablet 0   tamsulosin  (FLOMAX ) 0.4 MG CAPS capsule Take 1 capsule (0.4 mg total) by mouth daily after supper. 90 capsule 3   No current facility-administered medications for this visit.    REVIEW OF SYSTEMS:   All relevant systems were reviewed with the patient and are negative.  PHYSICAL EXAMINATION: ECOG PERFORMANCE STATUS: 0 VSS  GENERAL: alert, no distress and comfortable SKIN: skin color is normal, no jaundice LUNGS: Effort normal, no respiratory distress.    LABORATORY & PATHOLOGY DATA:  I have reviewed the results of labs, PSA and biopsy results related to his cancer.  RADIOGRAPHIC STUDIES: I have reviewed the radiological images related to his cancer during tumor board meeting.

## 2024-08-06 ENCOUNTER — Inpatient Hospital Stay

## 2024-08-06 ENCOUNTER — Encounter: Payer: Self-pay | Admitting: Urology

## 2024-08-06 ENCOUNTER — Ambulatory Visit
Admission: RE | Admit: 2024-08-06 | Discharge: 2024-08-06 | Disposition: A | Source: Ambulatory Visit | Attending: Radiation Oncology | Admitting: Radiation Oncology

## 2024-08-06 ENCOUNTER — Encounter: Payer: Self-pay | Admitting: Radiation Oncology

## 2024-08-06 VITALS — BP 143/85 | HR 65 | Temp 97.7°F | Resp 18 | Ht 70.0 in | Wt 183.8 lb

## 2024-08-06 DIAGNOSIS — Z79899 Other long term (current) drug therapy: Secondary | ICD-10-CM | POA: Diagnosis not present

## 2024-08-06 DIAGNOSIS — C61 Malignant neoplasm of prostate: Secondary | ICD-10-CM | POA: Insufficient documentation

## 2024-08-06 DIAGNOSIS — F17210 Nicotine dependence, cigarettes, uncomplicated: Secondary | ICD-10-CM | POA: Insufficient documentation

## 2024-08-06 DIAGNOSIS — Z888 Allergy status to other drugs, medicaments and biological substances status: Secondary | ICD-10-CM | POA: Diagnosis not present

## 2024-08-06 DIAGNOSIS — E785 Hyperlipidemia, unspecified: Secondary | ICD-10-CM | POA: Diagnosis not present

## 2024-08-06 DIAGNOSIS — Z833 Family history of diabetes mellitus: Secondary | ICD-10-CM | POA: Insufficient documentation

## 2024-08-06 DIAGNOSIS — Z825 Family history of asthma and other chronic lower respiratory diseases: Secondary | ICD-10-CM | POA: Insufficient documentation

## 2024-08-06 DIAGNOSIS — Z8249 Family history of ischemic heart disease and other diseases of the circulatory system: Secondary | ICD-10-CM | POA: Insufficient documentation

## 2024-08-06 DIAGNOSIS — I10 Essential (primary) hypertension: Secondary | ICD-10-CM | POA: Diagnosis not present

## 2024-08-06 DIAGNOSIS — N400 Enlarged prostate without lower urinary tract symptoms: Secondary | ICD-10-CM | POA: Insufficient documentation

## 2024-08-06 NOTE — Assessment & Plan Note (Addendum)
 Patient will follow-up with radiation oncology or urologic oncology for definitive treatment as well as active surveillance.  He may follow-up with medical oncology as needed. Healthy lifestyle to prevent diabetes and CV disease Weight-bearing exercises (30 minutes per day) Limit alcohol consumption and avoid smoking

## 2024-08-10 ENCOUNTER — Telehealth: Payer: Self-pay

## 2024-08-10 NOTE — Telephone Encounter (Signed)
 Jorge Walker,   Patient's MRI has resulted. No follow up appt was made at time of MRI booking. Would you like to do a video call with him later this week? We would have to overbook you. He siad any day at 1:30 is fine. He works everyday until 2:30pm but takes breaks at 1:30.   Isaiah, RN

## 2024-08-10 NOTE — Telephone Encounter (Signed)
-----   Message from Nurse Isaiah SQUIBB, RN sent at 08/02/2024  7:58 AM EST ----- Regarding: MRI Check for MRI to be completed, needs video visit with SG 1-2 weeks later to review results.

## 2024-08-10 NOTE — Telephone Encounter (Signed)
 See other phone note fro 08/04/2024. Called and spoke with patient and reviewed the information from the 08/04/24 phone note. Patient denies needing to come in for appt and is aware we will reach out to him for his annual LDCT. Nothing further needed at this time.

## 2024-08-11 ENCOUNTER — Telehealth: Payer: Self-pay | Admitting: *Deleted

## 2024-08-11 NOTE — Telephone Encounter (Signed)
 CALLED PATIENT TO INFORM OF FU APPT. WITH DR. ROSEANN TO DISCUSS TURP ON 09-07-24 - ARRIVAL TIME- 10:15 AM, SPOKE WITH PATIENT AND HE IS AWARE OF THIS APPT.

## 2024-08-13 NOTE — Progress Notes (Signed)
RN left message for call back to follow up after recent PMDC.

## 2024-08-16 ENCOUNTER — Other Ambulatory Visit: Payer: Self-pay | Admitting: Urology

## 2024-08-16 DIAGNOSIS — N401 Enlarged prostate with lower urinary tract symptoms: Secondary | ICD-10-CM

## 2024-08-27 ENCOUNTER — Other Ambulatory Visit (INDEPENDENT_AMBULATORY_CARE_PROVIDER_SITE_OTHER): Payer: Self-pay | Admitting: Primary Care

## 2024-08-27 DIAGNOSIS — I10 Essential (primary) hypertension: Secondary | ICD-10-CM

## 2024-08-27 NOTE — Telephone Encounter (Signed)
 Requested Prescriptions  Pending Prescriptions Disp Refills   olmesartan -hydrochlorothiazide (BENICAR  HCT) 20-12.5 MG tablet [Pharmacy Med Name: OLMESARTAN -HCTZ 20-12.5 MG TAB] 90 tablet 1    Sig: TAKE 1 TABLET BY MOUTH EVERY DAY     Cardiovascular: ARB + Diuretic Combos Failed - 08/27/2024  3:33 PM      Failed - K in normal range and within 180 days    Potassium  Date Value Ref Range Status  04/30/2024 3.2 (L) 3.5 - 5.1 mmol/L Final         Failed - Cr in normal range and within 180 days    Creat  Date Value Ref Range Status  01/02/2016 1.03 0.70 - 1.33 mg/dL Final   Creatinine, Ser  Date Value Ref Range Status  04/30/2024 1.29 (H) 0.61 - 1.24 mg/dL Final         Failed - Last BP in normal range    BP Readings from Last 1 Encounters:  08/06/24 (!) 143/85         Passed - Na in normal range and within 180 days    Sodium  Date Value Ref Range Status  04/30/2024 135 135 - 145 mmol/L Final  03/02/2024 137 134 - 144 mmol/L Final         Passed - eGFR is 10 or above and within 180 days    GFR calc Af Amer  Date Value Ref Range Status  09/06/2020 75 >59 mL/min/1.73 Final    Comment:    **In accordance with recommendations from the NKF-ASN Task force,**   [facility] is in the process of updating its eGFR calculation to the   2021 CKD-EPI creatinine equation that estimates kidney function   without a race variable.    GFR, Estimated  Date Value Ref Range Status  04/30/2024 >60 >60 mL/min Final    Comment:    (NOTE) Calculated using the CKD-EPI Creatinine Equation (2021)    eGFR  Date Value Ref Range Status  03/02/2024 69 >59 mL/min/1.73 Final         Passed - Patient is not pregnant      Passed - Valid encounter within last 6 months    Recent Outpatient Visits           1 month ago Physical exam   [place] [facility] Celestia Rosaline SQUIBB, NP   5 months ago Essential hypertension   [place] [facility] Celestia Rosaline SQUIBB, NP    8 months ago Chronic left shoulder pain   [place] [facility] Celestia Rosaline SQUIBB, NP   1 year ago Essential hypertension   [place] [facility] Celestia Rosaline SQUIBB, NP   1 year ago Eye redness   [place] [facility] [place], Jon HERO, PA-C       Future Appointments             In 1 week [place], Adine PARAS., MD [facility] at [facility]

## 2024-08-31 ENCOUNTER — Telehealth: Payer: Self-pay | Admitting: Urology

## 2024-08-31 NOTE — Telephone Encounter (Signed)
 Patient called asking stating that he has some questions about the medication he is taking. Finasteride , he would like to ask a few questions about the side effects.  Jorge Walker

## 2024-09-01 ENCOUNTER — Ambulatory Visit (INDEPENDENT_AMBULATORY_CARE_PROVIDER_SITE_OTHER)

## 2024-09-01 ENCOUNTER — Other Ambulatory Visit: Payer: Self-pay | Admitting: Podiatry

## 2024-09-01 ENCOUNTER — Ambulatory Visit (INDEPENDENT_AMBULATORY_CARE_PROVIDER_SITE_OTHER): Admitting: Podiatry

## 2024-09-01 DIAGNOSIS — M67471 Ganglion, right ankle and foot: Secondary | ICD-10-CM

## 2024-09-01 DIAGNOSIS — Q666 Other congenital valgus deformities of feet: Secondary | ICD-10-CM | POA: Diagnosis not present

## 2024-09-01 DIAGNOSIS — M79671 Pain in right foot: Secondary | ICD-10-CM

## 2024-09-01 NOTE — Progress Notes (Signed)
 "  Subjective:  Patient ID: Jorge Walker, male    DOB: 26-Aug-1966,  MRN: [id number]  Chief Complaint  Patient presents with   Foot Pain    Place on top of right foot     59 y.o. male presents with the above complaint.  Patient presents with right dorsal midfoot ganglion cyst he has tried protecting it changing shoe gear but nothing has helped.  Discussed surgical intervention.  He has failed all conservative care including treatment shoe gear modification offloading protecting.  He also does not wear any orthotics he has a flatfoot foot structure.  Pain scale 7 out of 10 dull aching nature hurts with ambulation is with pressure   Review of Systems: Negative except as noted in the HPI. Denies N/V/F/Ch.  Past Medical History:  Diagnosis Date   Allergy    Arthritis    finger   BPH (benign prostatic hypertrophy) 2020   BPH with elevated PSA   Elevated PSA    Epididymal cyst    Hypertension    Prostate cancer (HCC)    Tobacco use disorder    Current Medications[1]  Tobacco Use History[2]  Allergies[3] Objective:  There were no vitals filed for this visit. There is no height or weight on file to calculate BMI. Constitutional Well developed. Well nourished.  Vascular Dorsalis pedis pulses palpable bilaterally. Posterior tibial pulses palpable bilaterally. Capillary refill normal to all digits.  No cyanosis or clubbing noted. Pedal hair growth normal.  Neurologic Normal speech. Oriented to person, place, and time. Epicritic sensation to light touch grossly present bilaterally.  Dermatologic Right dorsal midfoot ganglion cyst pain on palpation.  This is single lobulated mobile positive translucent cyst.  Pain on palpation to it.  Negative extensor involvement noted.  No neuritis noted.  Orthopedic: Normal joint ROM without pain or crepitus bilaterally. No visible deformities. No bony tenderness.   Radiographs: None Assessment:   1. Ganglion cyst of right foot   2. Pes  planovalgus    Plan:  Patient was evaluated and treated and all questions answered.  Right dorsal midfoot ganglion cyst - All questions or concerns were discussed with the patient in extensive detail given the amount of pain that he is experiencing in setting of failed conservative care including shoe gear modification office draining he will benefit from surgical excision of the cyst.  I discussed my preoperative entire postoperative plan with the patient in extensive detail I discussed the dorsal incision.  He states understanding like to proceed with surgery -Informed surgical risk consent was reviewed and read aloud to the patient.  I reviewed the films.  I have discussed my findings with the patient in great detail.  I have discussed all risks including but not limited to infection, stiffness, scarring, limp, disability, deformity, damage to blood vessels and nerves, numbness, poor healing, need for braces, arthritis, chronic pain, amputation, death.  All benefits and realistic expectations discussed in great detail.  I have made no promises as to the outcome.  I have provided realistic expectations.  I have offered the patient a 2nd opinion, which they have declined and assured me they preferred to proceed despite the risks  Pes planovalgus/foot deformity -I explained to patient the etiology of pes planovalgus and relationship with heel pain/arch pain and various treatment options were discussed.  Given patient foot structure in the setting of heel pain/arch pain I believe patient will benefit from custom-made orthotics to help control the hindfoot motion support the arch of the foot  and take the stress away from arches.  Patient agrees with the plan like to proceed with orthotics -Patient was casted for orthotics   No follow-ups on file.    [1]  Current Outpatient Medications:    atorvastatin  (LIPITOR) 20 MG tablet, Take 1 tablet (20 mg total) by mouth daily. (Patient taking differently:  Take 40 mg by mouth daily.), Disp: 90 tablet, Rfl: 1   finasteride  (PROSCAR ) 5 MG tablet, Take 1 tablet (5 mg total) by mouth daily., Disp: 30 tablet, Rfl: 11   olmesartan -hydrochlorothiazide (BENICAR  HCT) 20-12.5 MG tablet, TAKE 1 TABLET BY MOUTH EVERY DAY, Disp: 90 tablet, Rfl: 1   tamsulosin  (FLOMAX ) 0.4 MG CAPS capsule, Take 1 capsule (0.4 mg total) by mouth daily after supper., Disp: 90 capsule, Rfl: 3 [2]  Social History Tobacco Use  Smoking Status Every Day   Current packs/day: 1.00   Average packs/day: 1 pack/day for 29.0 years (29.0 ttl pk-yrs)   Types: Cigarettes   Start date: 1997  Smokeless Tobacco Never  [3]  Allergies Allergen Reactions   Amlodipine  Swelling    ALL   "

## 2024-09-07 ENCOUNTER — Ambulatory Visit: Admitting: Urology

## 2024-09-07 NOTE — Progress Notes (Unsigned)
 "  Assessment: 1. Malignant neoplasm of prostate (HCC); T1c; GG 1; low risk; on active surveillance   2. Benign localized prostatic hyperplasia with lower urinary tract symptoms (LUTS)     Plan: I discussed the management options for low risk prostate cancer including active surveillance, radical prostatectomy, and radiation therapy.  He remains interested in definitive treatment, primarily radiation therapy. I again discussed my concerns that radiation therapy could make his lower urinary tract symptoms worse and treatment following radiation therapy is much more complicated. Given his large prostate volume, I think he would be a good candidate for HoLEP as opposed to TURP.  Chief Complaint: No chief complaint on file.   HPI: Jorge Walker is a 59 y.o. male who presents for continued evaluation of low risk prostate cancer and BPH with LUTS. He was previously followed by Dr. Shona and was last seen in March 2025.  Prostate cancer summary: Initial diagnosis: TRUS/BX 02/2023 Pathology: Gleason 3+3 = 6 in 1 of 12 cores (left lateral apex) with 13% core involvement Prostate volume = 60 mL PSA 01/2023 = 5.8 Clinical stage T1c/DRE:  nst, prostate large 60gm with mild symmetry R greater than L.  NO focal induration or nodularity    GPS= 12 (ave for group= 24)    Multiparametric prostate MRI 05/09/2023-- IMPRESSION: 1. Two PI-RADS category 3 lesions are identified in the peripheral zone. Targeting data sent to [facility]. 2. Prostatomegaly and benign prostatic hypertrophy. 3. Central pelvic kidney. 4.  Prostate Volume= 87.4 ml  He was doing well at his visit in March 2025.  He continued on active surveillance. PSA 3/25:  7.0 PSA 7/25:  6.8  Fusion guided biopsy from 04/13/2024 showed Gleason 3+3 = 6 adenocarcinoma in the right apex.  All other biopsies were negative for malignancy. Prostate volume = 128 cm.  At his visit in October 2025, he continued on tamsulosin  0.4 mg daily.  He  reported lower urinary tract symptoms including urgency, incomplete emptying, and some decrease stream and intermittent stream.  No dysuria or gross hematuria. IPSS = 10/6. He was seen in the multidisciplinary prostate cancer clinic in December 2025.  He is primarily interested in radiation therapy.  He was started on finasteride . He was seen by radiation oncology and interested in external beam radiation.  Portions of the above documentation were copied from a prior visit for review purposes only.  Allergies: Allergies  Allergen Reactions   Amlodipine  Swelling    ALL    PMH: Past Medical History:  Diagnosis Date   Allergy    Arthritis    finger   BPH (benign prostatic hypertrophy) 2020   BPH with elevated PSA   Elevated PSA    Epididymal cyst    Hypertension    Prostate cancer (HCC)    Tobacco use disorder     PSH: Past Surgical History:  Procedure Laterality Date   ARM WOUND REPAIR / CLOSURE     right upper posterior arm - teenager, s/p trauma   ARTERY REPAIR     right upper arm - teenager, s/p trauma to right upper arm   COLONOSCOPY  01/24/2016   Diverticulosis in left and right colon.   Repeat 10 years.  Dr. Norleen Abran   ELBOW SURGERY  2019   PROSTATE BIOPSY      SH: Social History   Tobacco Use   Smoking status: Every Day    Current packs/day: 1.00    Average packs/day: 1 pack/day for 29.0 years (29.0 ttl pk-yrs)  Types: Cigarettes    Start date: 1997   Smokeless tobacco: Never  Vaping Use   Vaping status: Never Used  Substance Use Topics   Alcohol use: Yes    Alcohol/week: 5.0 standard drinks of alcohol    Types: 5 Shots of liquor per week    Comment: weekends    Drug use: No    ROS: Constitutional:  Negative for fever, chills, weight loss CV: Negative for chest pain, previous MI, hypertension Respiratory:  Negative for shortness of breath, wheezing, sleep apnea, frequent cough GI:  Negative for nausea, vomiting, bloody stool,  GERD  PE: There were no vitals taken for this visit. GENERAL APPEARANCE:  Well appearing, well developed, well nourished, NAD HEENT:  Atraumatic, normocephalic, oropharynx clear NECK:  Supple without lymphadenopathy or thyromegaly ABDOMEN:  Soft, non-tender, no masses EXTREMITIES:  Moves all extremities well, without clubbing, cyanosis, or edema NEUROLOGIC:  Alert and oriented x 3, normal gait, CN II-XII grossly intact MENTAL STATUS:  appropriate BACK:  Non-tender to palpation, No CVAT SKIN:  Warm, dry, and intact   Results: U/A: "

## 2024-09-10 ENCOUNTER — Encounter: Payer: Self-pay | Admitting: Urology

## 2024-09-10 ENCOUNTER — Telehealth: Payer: Self-pay | Admitting: Podiatry

## 2024-09-10 ENCOUNTER — Ambulatory Visit: Admitting: Urology

## 2024-09-10 VITALS — BP 128/82 | HR 55 | Ht 70.0 in | Wt 185.0 lb

## 2024-09-10 DIAGNOSIS — C61 Malignant neoplasm of prostate: Secondary | ICD-10-CM | POA: Diagnosis not present

## 2024-09-10 DIAGNOSIS — R350 Frequency of micturition: Secondary | ICD-10-CM | POA: Diagnosis not present

## 2024-09-10 DIAGNOSIS — N401 Enlarged prostate with lower urinary tract symptoms: Secondary | ICD-10-CM | POA: Diagnosis not present

## 2024-09-10 DIAGNOSIS — R3912 Poor urinary stream: Secondary | ICD-10-CM | POA: Diagnosis not present

## 2024-09-10 LAB — URINALYSIS, ROUTINE W REFLEX MICROSCOPIC
Bilirubin, UA: NEGATIVE
Glucose, UA: NEGATIVE
Ketones, UA: NEGATIVE
Leukocytes,UA: NEGATIVE
Nitrite, UA: NEGATIVE
Protein,UA: NEGATIVE
RBC, UA: NEGATIVE
Specific Gravity, UA: 1.025 (ref 1.005–1.030)
Urobilinogen, Ur: 1 mg/dL (ref 0.2–1.0)
pH, UA: 6.5 (ref 5.0–7.5)

## 2024-09-10 NOTE — Telephone Encounter (Signed)
 Spoke with patient on 09/10/24, at 1:01 to PUO

## 2024-09-10 NOTE — Progress Notes (Signed)
 "  Assessment: 1. Malignant neoplasm of prostate (HCC); T1c; GG 1; low risk; on active surveillance   2. Benign localized prostatic hyperplasia with lower urinary tract symptoms (LUTS)     Plan: I discussed the management options for low risk prostate cancer including active surveillance, radical prostatectomy, and radiation therapy.  I again discussed that given his low risk prostate cancer active surveillance is a very reasonable option.  However, he remains interested in definitive treatment, primarily radiation therapy. I again discussed my concerns that radiation therapy could make his lower urinary tract symptoms worse and management following radiation therapy is much more complicated. Given his large prostate volume, I think he would be a good candidate for HoLEP as opposed to TURP. I provided him with information on treatment of BPH. Continue tamsulosin  and finasteride . Referral to Dr.Sninsky at [facility] to discuss HoLEP.  bsto  Chief Complaint: Chief Complaint  Patient presents with   Prostate Cancer    HPI: Jorge Walker is a 59 y.o. male who presents for continued evaluation of low risk prostate cancer and BPH with LUTS. He was previously followed by Dr. Shona and was last seen in March 2025.  Prostate cancer summary: Initial diagnosis: TRUS/BX 02/2023 Pathology: Gleason 3+3 = 6 in 1 of 12 cores (left lateral apex) with 13% core involvement Prostate volume = 60 mL PSA 01/2023 = 5.8 Clinical stage T1c/DRE:  nst, prostate large 60gm with mild symmetry R greater than L.  NO focal induration or nodularity    GPS= 12 (ave for group= 24)    Multiparametric prostate MRI 05/09/2023-- IMPRESSION: 1. Two PI-RADS category 3 lesions are identified in the peripheral zone. Targeting data sent to [facility]. 2. Prostatomegaly and benign prostatic hypertrophy. 3. Central pelvic kidney. 4.  Prostate Volume= 87.4 ml  He was doing well at his visit in March 2025.  He continued on active  surveillance. PSA 3/25:  7.0 PSA 7/25:  6.8  Fusion guided biopsy from 04/13/2024 showed Gleason 3+3 = 6 adenocarcinoma in the right apex.  All other biopsies were negative for malignancy. Prostate volume = 128 cm.  He developed urinary retention following the biopsy and had a Foley catheter placed.  He passed a voiding trial on 04/16/2024.  At his visit in October 2025, he continued on tamsulosin  0.4 mg daily.  He reported lower urinary tract symptoms including urgency, incomplete emptying, and some decrease stream and intermittent stream.  No dysuria or gross hematuria. IPSS = 10/6. He was seen in the multidisciplinary prostate cancer clinic in December 2025.  He is primarily interested in radiation therapy.  He was started on finasteride . He was seen by radiation oncology and interested in external beam radiation. He continues on tamsulosin  and was recently started on finasteride .  His lower urinary tract symptoms are fairly stable.  He continue use to have frequency and weak stream.  No dysuria or gross hematuria. IPSS = 9/4.  Portions of the above documentation were copied from a prior visit for review purposes only.  Allergies: Allergies  Allergen Reactions   Amlodipine  Swelling    ALL    PMH: Past Medical History:  Diagnosis Date   Allergy    Arthritis    finger   BPH (benign prostatic hypertrophy) 2020   BPH with elevated PSA   Elevated PSA    Epididymal cyst    Hypertension    Prostate cancer (HCC)    Tobacco use disorder     PSH: Past Surgical History:  Procedure Laterality  Date   ARM WOUND REPAIR / CLOSURE     right upper posterior arm - teenager, s/p trauma   ARTERY REPAIR     right upper arm - teenager, s/p trauma to right upper arm   COLONOSCOPY  01/24/2016   Diverticulosis in left and right colon.   Repeat 10 years.  Dr. Norleen Kiang   ELBOW SURGERY  2019   PROSTATE BIOPSY      SH: Social History   Tobacco Use   Smoking status: Every Day     Current packs/day: 1.00    Average packs/day: 1 pack/day for 29.0 years (29.0 ttl pk-yrs)    Types: Cigarettes    Start date: 1997   Smokeless tobacco: Never  Vaping Use   Vaping status: Never Used  Substance Use Topics   Alcohol use: Yes    Alcohol/week: 5.0 standard drinks of alcohol    Types: 5 Shots of liquor per week    Comment: weekends    Drug use: No    ROS: Constitutional:  Negative for fever, chills, weight loss CV: Negative for chest pain, previous MI, hypertension Respiratory:  Negative for shortness of breath, wheezing, sleep apnea, frequent cough GI:  Negative for nausea, vomiting, bloody stool, GERD  PE: BP 128/82   Pulse (!) 55   Ht 5' 10 (1.778 m)   Wt 185 lb (83.9 kg)   BMI 26.54 kg/m  GENERAL APPEARANCE:  Well appearing, well developed, well nourished, NAD HEENT:  Atraumatic, normocephalic, oropharynx clear NECK:  Supple without lymphadenopathy or thyromegaly ABDOMEN:  Soft, non-tender, no masses EXTREMITIES:  Moves all extremities well, without clubbing, cyanosis, or edema NEUROLOGIC:  Alert and oriented x 3, normal gait, CN II-XII grossly intact MENTAL STATUS:  appropriate BACK:  Non-tender to palpation, No CVAT SKIN:  Warm, dry, and intact   Results: U/A: Negative "

## 2024-09-17 ENCOUNTER — Encounter

## 2024-09-20 ENCOUNTER — Other Ambulatory Visit

## 2024-09-20 NOTE — Progress Notes (Signed)
 Patient had recent follow up with Dr. Roseann and remains adamant for radiation treatment.  Dr. Roseann recommended patient to meet with Dr. Francisca to talk about HoLEP.    Patient is now scheduled for consult on 2/4.  RN will continue to follow.  Patient will need recovery post HoLEP prior to radiation, and a repeat PSA prior to start of treatment.

## 2024-09-22 ENCOUNTER — Other Ambulatory Visit: Payer: Self-pay | Admitting: Urology

## 2024-09-22 ENCOUNTER — Telehealth: Payer: Self-pay | Admitting: Urology

## 2024-09-22 MED ORDER — TADALAFIL 20 MG PO TABS: 20.0000 mg | ORAL_TABLET | Freq: Every day | ORAL | 11 refills | Status: AC | PRN

## 2024-09-22 NOTE — Telephone Encounter (Signed)
 Patient called and stated he is concerned that his medication finasteride  is giving him an allergic reaction. He states that his genital area is itchy and would like to know if there is another medication you can prescribe for him. He also asked if he could be prescribed Cialis . He says that his best form of contact is by phone [phone]. If you can reach him by phone, send him a MyChart message.

## 2024-09-23 ENCOUNTER — Ambulatory Visit

## 2024-09-23 DIAGNOSIS — Q666 Other congenital valgus deformities of feet: Secondary | ICD-10-CM

## 2024-09-23 DIAGNOSIS — M778 Other enthesopathies, not elsewhere classified: Secondary | ICD-10-CM

## 2024-09-23 NOTE — Progress Notes (Signed)
 ORTHOTIC DISPENSING:   Reason for Visit:         Fitting and Delivery of Custom Fabricated Foot Orthoses Patient Report:            Patient reports comfort and is satisfied with device.   OBJECTIVE DATA: Patient History / Diagnosis:    No change in pathology Provided Device:                     Functional foot orthoses   GOAL OF ORTHOSIS - Improve gait - Decrease energy expenditure - Improve Balance - Provide Triplanar stability of foot complex - Facilitate motion   ACTIONS PERFORMED Patient was fit with custom foot orthoses   Patient was provided with verbal and written instruction and demonstration regarding wear, care, proper fit, function, and use of the orthosis.    Patient was also provided with verbal instruction regarding how to report any failures or malfunctions of the orthosis and necessary follow up care. Patient was also instructed to contact our office regarding any change in status that may affect the function of the orthosis.   Patient demonstrated understanding of all instructions.  Lamarr Salen, DPM   Patient asked about surgery to fix his cyst- I will ask surgery scheduling to call him to discuss options and dates.

## 2024-09-29 ENCOUNTER — Other Ambulatory Visit: Payer: Self-pay

## 2024-09-29 ENCOUNTER — Ambulatory Visit (INDEPENDENT_AMBULATORY_CARE_PROVIDER_SITE_OTHER): Admitting: Urology

## 2024-09-29 VITALS — BP 114/75 | HR 54 | Ht 70.0 in | Wt 181.0 lb

## 2024-09-29 DIAGNOSIS — C61 Malignant neoplasm of prostate: Secondary | ICD-10-CM

## 2024-09-29 DIAGNOSIS — N401 Enlarged prostate with lower urinary tract symptoms: Secondary | ICD-10-CM

## 2024-09-29 LAB — BLADDER SCAN AMB NON-IMAGING

## 2024-09-29 NOTE — Patient Instructions (Signed)

## 2024-09-29 NOTE — Progress Notes (Unsigned)
 Surgical Physician Order Form [facility]  Dr. Legrand Rams, MD  * Scheduling expectation : Next Available  *Length of Case: 2 hours  *Clearance needed: no  *Anticoagulation Instructions: Hold all anticoagulants  *Aspirin Instructions: Hold Aspirin  *Post-op visit Date/Instructions:  1-3 day cath removal  *Diagnosis: BPH w/urinary obstruction  *Procedure:  HOLEP (09811)   Additional orders: N/A  -Admit type: OUTpatient  -Anesthesia: General  -VTE Prophylaxis Standing Order SCD's       Other:   -Standing Lab Orders Per Anesthesia    Lab other: UA&Urine Culture  -Standing Test orders EKG/Chest x-ray per Anesthesia       Test other:   - Medications:  Ancef 2gm IV  -Other orders:  N/A

## 2024-09-29 NOTE — Progress Notes (Signed)
" ° °  09/29/2024 4:35 PM   Jorge Walker 29-May-1966 [id number]  Reason for visit: Low risk prostate cancer, BPH with history of retention  History: Has been followed by Dr. Shona and Dr. Roseann, diagnosed with low-volume, low risk prostate cancer on recent MRI fusion biopsy, patient strongly defers definitive treatment and is planning on XRT Prostate 128 g on recent biopsy, and he developed postoperative urinary retention, currently on Flomax  and finasteride  Referred to consider HOLEP prior to XRT to prevent worsening urinary symptoms or recurrent retention  Physical Exam: BP 114/75 (BP Location: Left Arm, Patient Position: Sitting, Cuff Size: Normal)   Pulse (!) 54   Ht 5' 10 (1.778 m)   Wt 181 lb (82.1 kg)   SpO2 100%   BMI 25.97 kg/m   Imaging/labs: Outside records reviewed at length showing low-volume low risk prostate cancer, prostate volume 128 g  Today: Urinary symptoms well-controlled on Flomax  and finasteride  at this time, history of retention after prostate biopsy He strongly desires definitive treatment for his low risk, low-volume prostate cancer  Plan:   Low risk prostate cancer: I again discussed surveillance is a very reasonable option regarding his low risk, low-volume disease, he strongly prefers definitive treatment with radiation BPH: Agree that HOLEP likely good option with his history of retention and prostate greater than 100 g prior to XRT.  We discussed possible side effects of radiation on his urinary symptoms, and limited outlet procedure options after radiation with high risk of incontinence or complications.  Also discussed could follow-up HOLEP pathology and repeat a PSA at 4 to 6 weeks after and if PSA very low could continue surveillance We discussed the risks and benefits of HoLEP at length.  The procedure requires general anesthesia and takes 1 to 2 hours, and a holmium laser is used to enucleate the prostate and push this tissue into the bladder.   A morcellator is then used to remove this tissue, which is sent for pathology.  The vast majority(>95%) of patients are able to discharge the same day with a catheter in place for 2 to 3 days, and will follow-up in clinic for a voiding trial.  We specifically discussed the risks of bleeding, infection, retrograde ejaculation, temporary urgency and urge incontinence, very low risk of long-term incontinence, urethral stricture/bladder neck contracture, pathologic evaluation of prostate tissue and possible detection of prostate cancer or other malignancy, and possible need for additional procedures. Schedule YNOZE(871h)  I spent 45 total minutes on the day of the encounter including pre-visit review of the medical record, face-to-face time with the patient, and post visit ordering of labs/imaging/tests.  Extensive review of outside records, conversation with Dr. Roseann, and multiple treatment options in the setting of BPH and low risk prostate cancer.   Redell JAYSON Burnet, MD  [facility] [address], [address] [place], [place] [address] [phone]  "

## 2024-09-30 NOTE — Progress Notes (Signed)
 RN spoke with patient after recent visit with Dr. Francisca.  Patient will proceed with HOLEP and plans for PSA 4-6 weeks after.  Patient may decide to stick with active surveillance based on these results.  Patient aware we will continue to follow and review.  RN provided direct contact again to patient and encouraged him to reach out with any questions.

## 2024-10-28 ENCOUNTER — Other Ambulatory Visit

## 2024-11-08 ENCOUNTER — Ambulatory Visit: Admit: 2024-11-08 | Admitting: Urology

## 2024-11-10 ENCOUNTER — Ambulatory Visit: Admitting: Physician Assistant

## 2024-12-29 ENCOUNTER — Ambulatory Visit (INDEPENDENT_AMBULATORY_CARE_PROVIDER_SITE_OTHER): Admitting: Primary Care

## 2025-03-03 ENCOUNTER — Ambulatory Visit: Admitting: Urology
